# Patient Record
Sex: Male | Born: 1955
Health system: Southern US, Community
[De-identification: ages and names within clinical notes are randomized; demographics above are authoritative.]

## PROBLEM LIST (undated history)

## (undated) DIAGNOSIS — F419 Anxiety disorder, unspecified: Secondary | ICD-10-CM

## (undated) DIAGNOSIS — D4989 Neoplasm of unspecified behavior of other specified sites: Secondary | ICD-10-CM

## (undated) DIAGNOSIS — C801 Malignant (primary) neoplasm, unspecified: Secondary | ICD-10-CM

## (undated) DIAGNOSIS — E039 Hypothyroidism, unspecified: Secondary | ICD-10-CM

## (undated) DIAGNOSIS — Z923 Personal history of irradiation: Secondary | ICD-10-CM

## (undated) HISTORY — DX: Anxiety disorder, unspecified: F41.9

## (undated) HISTORY — PX: SKIN LESION EXCISION: SHX2412

---

## 2018-08-26 NOTE — Progress Notes (Deleted)
   New Patient Office Visit  Assessment & Plan:  ***  Follow-up: No follow-ups on file.   Hendricks Limes, MSN, APRN, FNP-C [facility]  Subjective:  Patient ID: Antonio Scott, male    DOB: 12-Sep-1955  Age: 63 y.o. MRN: [id number]  No care team member to display  CC: No chief complaint on file.   HPI Amiere Cawley presents to establish care. *** Patient also ***   ROS  No current outpatient medications on file.  Not on File  No past medical history on file.  *** The histories are not reviewed yet. Please review them in the "History" navigator section and refresh this [place].  No family history on file.  Social History   Socioeconomic History  . Marital status: Not on file    Spouse name: Not on file  . Number of children: Not on file  . Years of education: Not on file  . Highest education level: Not on file  Occupational History  . Not on file  Social Needs  . Financial resource strain: Not on file  . Food insecurity    Worry: Not on file    Inability: Not on file  . Transportation needs    Medical: Not on file    Non-medical: Not on file  Tobacco Use  . Smoking status: Not on file  Substance and Sexual Activity  . Alcohol use: Not on file  . Drug use: Not on file  . Sexual activity: Not on file  Lifestyle  . Physical activity    Days per week: Not on file    Minutes per session: Not on file  . Stress: Not on file  Relationships  . Social Herbalist on phone: Not on file    Gets together: Not on file    Attends religious service: Not on file    Active member of club or organization: Not on file    Attends meetings of clubs or organizations: Not on file    Relationship status: Not on file  . Intimate partner violence    Fear of current or ex partner: Not on file    Emotionally abused: Not on file    Physically abused: Not on file    Forced sexual activity: Not on file  Other Topics Concern  . Not on file   Social History Narrative  . Not on file    Objective:   Today's Vitals: There were no vitals taken for this visit.  Physical Exam

## 2018-08-27 ENCOUNTER — Ambulatory Visit: Payer: Self-pay | Admitting: Family Medicine

## 2020-09-03 DIAGNOSIS — G8929 Other chronic pain: Secondary | ICD-10-CM | POA: Diagnosis not present

## 2020-09-03 DIAGNOSIS — Z720 Tobacco use: Secondary | ICD-10-CM | POA: Diagnosis not present

## 2020-09-03 DIAGNOSIS — Z833 Family history of diabetes mellitus: Secondary | ICD-10-CM | POA: Diagnosis not present

## 2020-09-03 DIAGNOSIS — Z809 Family history of malignant neoplasm, unspecified: Secondary | ICD-10-CM | POA: Diagnosis not present

## 2020-09-03 DIAGNOSIS — K08109 Complete loss of teeth, unspecified cause, unspecified class: Secondary | ICD-10-CM | POA: Diagnosis not present

## 2020-09-03 DIAGNOSIS — R030 Elevated blood-pressure reading, without diagnosis of hypertension: Secondary | ICD-10-CM | POA: Diagnosis not present

## 2020-11-15 ENCOUNTER — Encounter: Payer: Self-pay | Admitting: Family Medicine

## 2020-11-15 ENCOUNTER — Ambulatory Visit (INDEPENDENT_AMBULATORY_CARE_PROVIDER_SITE_OTHER): Payer: Medicare HMO | Admitting: Family Medicine

## 2020-11-15 ENCOUNTER — Other Ambulatory Visit: Payer: Self-pay

## 2020-11-15 VITALS — BP 116/79 | HR 63 | Temp 98.0°F | Ht 71.25 in | Wt 176.0 lb

## 2020-11-15 DIAGNOSIS — F17210 Nicotine dependence, cigarettes, uncomplicated: Secondary | ICD-10-CM

## 2020-11-15 DIAGNOSIS — Z122 Encounter for screening for malignant neoplasm of respiratory organs: Secondary | ICD-10-CM

## 2020-11-15 DIAGNOSIS — Z1329 Encounter for screening for other suspected endocrine disorder: Secondary | ICD-10-CM

## 2020-11-15 DIAGNOSIS — Z1159 Encounter for screening for other viral diseases: Secondary | ICD-10-CM

## 2020-11-15 DIAGNOSIS — Z13220 Encounter for screening for lipoid disorders: Secondary | ICD-10-CM

## 2020-11-15 DIAGNOSIS — R351 Nocturia: Secondary | ICD-10-CM

## 2020-11-15 DIAGNOSIS — Z131 Encounter for screening for diabetes mellitus: Secondary | ICD-10-CM

## 2020-11-15 DIAGNOSIS — Z1211 Encounter for screening for malignant neoplasm of colon: Secondary | ICD-10-CM | POA: Diagnosis not present

## 2020-11-15 DIAGNOSIS — Z23 Encounter for immunization: Secondary | ICD-10-CM

## 2020-11-15 DIAGNOSIS — Z130 Encounter for screening for diseases of the blood and blood-forming organs and certain disorders involving the immune mechanism: Secondary | ICD-10-CM

## 2020-11-15 DIAGNOSIS — Z125 Encounter for screening for malignant neoplasm of prostate: Secondary | ICD-10-CM

## 2020-11-15 DIAGNOSIS — Z136 Encounter for screening for cardiovascular disorders: Secondary | ICD-10-CM | POA: Diagnosis not present

## 2020-11-15 DIAGNOSIS — R69 Illness, unspecified: Secondary | ICD-10-CM | POA: Diagnosis not present

## 2020-11-15 DIAGNOSIS — Z1212 Encounter for screening for malignant neoplasm of rectum: Secondary | ICD-10-CM

## 2020-11-15 LAB — URINALYSIS, ROUTINE W REFLEX MICROSCOPIC
Bilirubin, UA: NEGATIVE
Glucose, UA: NEGATIVE
Ketones, UA: NEGATIVE
Leukocytes,UA: NEGATIVE
Nitrite, UA: NEGATIVE
Protein,UA: NEGATIVE
RBC, UA: NEGATIVE
Specific Gravity, UA: 1.015 (ref 1.005–1.030)
Urobilinogen, Ur: 0.2 mg/dL (ref 0.2–1.0)
pH, UA: 6 (ref 5.0–7.5)

## 2020-11-15 NOTE — Progress Notes (Signed)
Subjective:  Patient ID: Antonio Scott, male    DOB: [birth year], 65 y.o.   MRN: [id number]  Patient Care Team: Baruch Gouty, FNP as PCP - General (Family Medicine)   Chief Complaint:  New Patient (Initial Visit)   HPI: Antonio Scott is a 65 y.o. male presenting on 11/15/2020 for New Patient (Initial Visit)  Pt presents today to establish care with PCP, he has not seen a provider in over 15 years. He was seen at Urgent Care in 2020 for puncture wound of foot and cellulitis. No blood work in over 15 years. He reports overall he feels well. Is concerned about colorectal cancer as his grandfather had this. He states his bowel movements are normal. No melena or hematochezia. No weight loss, night sweats, fatigue, or malaise. He states he has nocturia, gets up at least 2-3 times per night to void. He admits to excessive caffeine use and states he drinks several cups of coffee per day and does have coffee before bed. He denies dysuria, hematuria, urgency, dribbling, weak stream, or difficulty initiating voiding. No rectal or scrotal pain/pressure, no scrotal swelling. He smokes at least 1 PPD and has for 50 years. No prior lung cancer screenings. He does not follow a diet or exercise routine. No recent illnesses or injuries.   Relevant past medical, surgical, family, and social history reviewed and updated as indicated.  Allergies and medications reviewed and updated. Data reviewed: Chart in Epic.   Past Medical History:  Diagnosis Date   Anxiety     Past Surgical History:  Procedure Laterality Date   SKIN LESION EXCISION      Social History   Socioeconomic History   Marital status: Single    Spouse name: Not on file   Number of children: Not on file   Years of education: Not on file   Highest education level: Not on file  Occupational History   Not on file  Tobacco Use   Smoking status: Every Day    Packs/day: 1.00    Years: 50.00    Pack years: 50.00    Types: Cigarettes    Smokeless tobacco: Never  Vaping Use   Vaping Use: Never used  Substance and Sexual Activity   Alcohol use: Yes    Alcohol/week: 2.0 standard drinks    Types: 2 Cans of beer per week   Drug use: Not Currently   Sexual activity: Not Currently  Other Topics Concern   Not on file  Social History Narrative   Not on file   Social Determinants of Health   Financial Resource Strain: Not on file  Food Insecurity: Not on file  Transportation Needs: Not on file  Physical Activity: Not on file  Stress: Not on file  Social Connections: Not on file  Intimate Partner Violence: Not on file    No Known Allergies  Review of Systems  Constitutional:  Negative for activity change, appetite change, chills, diaphoresis, fatigue, fever and unexpected weight change.  HENT: Negative.    Eyes: Negative.  Negative for photophobia and visual disturbance.  Respiratory:  Negative for cough, chest tightness and shortness of breath.   Cardiovascular:  Negative for chest pain, palpitations and leg swelling.  Gastrointestinal:  Negative for abdominal pain, blood in stool, constipation, diarrhea, nausea and vomiting.  Endocrine: Negative.   Genitourinary:  Positive for frequency. Negative for decreased urine volume, difficulty urinating, dysuria and urgency.  Musculoskeletal:  Negative for arthralgias and myalgias.  Skin: Negative.  Allergic/Immunologic: Negative.   Neurological:  Negative for dizziness, tremors, syncope, facial asymmetry, speech difficulty, weakness, light-headedness, numbness and headaches.  Hematological: Negative.   Psychiatric/Behavioral:  Negative for confusion, hallucinations, sleep disturbance and suicidal ideas.   All other systems reviewed and are negative.      Objective:  BP 116/79   Pulse 63   Temp 98 F (36.7 C)   Ht 5' 11.25" (1.81 m)   Wt 176 lb (79.8 kg)   SpO2 100%   BMI 24.38 kg/m    Wt Readings from Last 3 Encounters:  11/15/20 176 lb (79.8 kg)     Physical Exam Vitals and nursing note reviewed.  Constitutional:      General: He is not in acute distress.    Appearance: Normal appearance. He is well-developed, well-groomed and normal weight. He is not ill-appearing, toxic-appearing or diaphoretic.  HENT:     Head: Normocephalic and atraumatic.     Jaw: There is normal jaw occlusion.     Right Ear: Hearing, tympanic membrane, ear canal and external ear normal.     Left Ear: Hearing, tympanic membrane, ear canal and external ear normal.     Nose: Nose normal.     Mouth/Throat:     Lips: Pink.     Mouth: Mucous membranes are moist.     Dentition: Abnormal dentition (missing teeth).     Pharynx: Oropharynx is clear. Uvula midline.  Eyes:     General: Lids are normal.     Extraocular Movements: Extraocular movements intact.     Conjunctiva/sclera: Conjunctivae normal.     Pupils: Pupils are equal, round, and reactive to light.  Neck:     Thyroid: No thyroid mass, thyromegaly or thyroid tenderness.     Vascular: No carotid bruit or JVD.     Trachea: Trachea and phonation normal.  Cardiovascular:     Rate and Rhythm: Normal rate and regular rhythm.     Chest Wall: PMI is not displaced.     Pulses: Normal pulses.     Heart sounds: Normal heart sounds. No murmur heard.   No friction rub. No gallop.  Pulmonary:     Effort: Pulmonary effort is normal. No respiratory distress.     Breath sounds: Normal breath sounds. No wheezing.  Abdominal:     General: Bowel sounds are normal. There is no distension or abdominal bruit.     Palpations: Abdomen is soft. There is no hepatomegaly or splenomegaly.     Tenderness: There is no abdominal tenderness. There is no right CVA tenderness or left CVA tenderness.     Hernia: No hernia is present.  Musculoskeletal:        General: Normal range of motion.     Cervical back: Normal range of motion and neck supple.     Right lower leg: No edema.     Left lower leg: No edema.   Lymphadenopathy:     Cervical: No cervical adenopathy.  Skin:    General: Skin is warm and dry.     Capillary Refill: Capillary refill takes less than 2 seconds.     Coloration: Skin is not cyanotic, jaundiced or pale.     Findings: No rash.  Neurological:     General: No focal deficit present.     Mental Status: He is alert and oriented to person, place, and time.     Cranial Nerves: Cranial nerves are intact. No cranial nerve deficit.     Sensory: Sensation is intact.  No sensory deficit.     Motor: Motor function is intact. No weakness.     Coordination: Coordination is intact. Coordination normal.     Gait: Gait is intact. Gait normal.     Deep Tendon Reflexes: Reflexes are normal and symmetric. Reflexes normal.  Psychiatric:        Attention and Perception: Attention and perception normal.        Mood and Affect: Mood normal. Affect is flat.        Speech: Speech normal.        Behavior: Behavior normal. Behavior is cooperative.        Thought Content: Thought content normal.        Cognition and Memory: Cognition and memory normal.        Judgment: Judgment normal.    Urinalysis unremarkable in office.   Pertinent labs & imaging results that were available during my care of the patient were reviewed by me and considered in my medical decision making.  Assessment & Plan:  Antonio Scott was seen today for new patient (initial visit).  Diagnoses and all orders for this visit:  Nocturia more than twice per night Urinalysis unremarkable in office. Admits to excessive caffeine intake throughout the day, could be contributatory. Pt aware to limit intake of caffeine at least 4 hours prior to bedtime. Will obtain PSA. Likely BPH. Pt aware to report new, worsening, or persistent symptoms.  -     Urinalysis, Routine w reflex microscopic -     PR PSA, TOTAL SCREENING  Screening for colorectal cancer Referral to GI placed.  -     Ambulatory referral to Gastroenterology  Screening for  lung cancer Heavy cigarette smoker 50 pack year history with continued addiction, cessation emphasized. Will obtain LDCT chest for lung cancer screening.  -     CT CHEST LUNG CA SCREEN LOW DOSE W/O CM; Future  Screening for thyroid disorder Will check thyroid function today.  -     Thyroid Panel With TSH  Screening for lipid disorders Will check cholesterol today, pt is fasting.  -     Lipid panel  Screening for deficiency anemia Will check CBC today, if abnormal, will add additional labs.        -     CBC with Differential/Platelet  Screening for diabetes mellitus Will check CMP today.  -     CMP  Screening for prostate cancer Will check PSA today.  -     PR PSA, TOTAL SCREENING  Encounter for hepatitis C screening test for low risk patient -     Hepatitis C antibody  Need for influenza vaccination Influenza vaccine provided today.     Continue all other maintenance medications.  Follow up plan: Return in about 6 months (around 05/16/2021), or if symptoms worsen or fail to improve.   Continue healthy lifestyle choices, including diet (rich in fruits, vegetables, and lean proteins, and low in salt and simple carbohydrates) and exercise (at least 30 minutes of moderate physical activity daily).  Educational handout given for health maintenance  The above assessment and management plan was discussed with the patient. The patient verbalized understanding of and has agreed to the management plan. Patient is aware to call the clinic if they develop any new symptoms or if symptoms persist or worsen. Patient is aware when to return to the clinic for a follow-up visit. Patient educated on when it is appropriate to go to the emergency department.   Monia Pouch, FNP-C  [place] [phone]

## 2020-11-16 ENCOUNTER — Ambulatory Visit (INDEPENDENT_AMBULATORY_CARE_PROVIDER_SITE_OTHER): Payer: Medicare HMO | Admitting: Family Medicine

## 2020-11-16 ENCOUNTER — Other Ambulatory Visit: Payer: Self-pay

## 2020-11-16 ENCOUNTER — Encounter: Payer: Self-pay | Admitting: Family Medicine

## 2020-11-16 VITALS — BP 121/77 | HR 71 | Temp 97.7°F | Ht 71.25 in | Wt 176.0 lb

## 2020-11-16 DIAGNOSIS — R7989 Other specified abnormal findings of blood chemistry: Secondary | ICD-10-CM | POA: Insufficient documentation

## 2020-11-16 DIAGNOSIS — E039 Hypothyroidism, unspecified: Secondary | ICD-10-CM | POA: Diagnosis not present

## 2020-11-16 DIAGNOSIS — E782 Mixed hyperlipidemia: Secondary | ICD-10-CM

## 2020-11-16 LAB — CBC WITH DIFFERENTIAL/PLATELET
Basophils Absolute: 0.1 10*3/uL (ref 0.0–0.2)
Basos: 2 %
EOS (ABSOLUTE): 0.3 10*3/uL (ref 0.0–0.4)
Eos: 4 %
Hematocrit: 41.7 % (ref 37.5–51.0)
Hemoglobin: 13.9 g/dL (ref 13.0–17.7)
Immature Grans (Abs): 0 10*3/uL (ref 0.0–0.1)
Immature Granulocytes: 0 %
Lymphocytes Absolute: 1.3 10*3/uL (ref 0.7–3.1)
Lymphs: 21 %
MCH: 27.6 pg (ref 26.6–33.0)
MCHC: 33.3 g/dL (ref 31.5–35.7)
MCV: 83 fL (ref 79–97)
Monocytes Absolute: 0.7 10*3/uL (ref 0.1–0.9)
Monocytes: 11 %
Neutrophils Absolute: 4 10*3/uL (ref 1.4–7.0)
Neutrophils: 62 %
Platelets: 244 10*3/uL (ref 150–450)
RBC: 5.04 x10E6/uL (ref 4.14–5.80)
RDW: 12.7 % (ref 11.6–15.4)
WBC: 6.4 10*3/uL (ref 3.4–10.8)

## 2020-11-16 LAB — CMP14+EGFR
ALT: 18 IU/L (ref 0–44)
AST: 21 IU/L (ref 0–40)
Albumin/Globulin Ratio: 1.7 (ref 1.2–2.2)
Albumin: 4.7 g/dL (ref 3.8–4.8)
Alkaline Phosphatase: 95 IU/L (ref 44–121)
BUN/Creatinine Ratio: 8 — ABNORMAL LOW (ref 10–24)
BUN: 13 mg/dL (ref 8–27)
Bilirubin Total: 0.3 mg/dL (ref 0.0–1.2)
CO2: 22 mmol/L (ref 20–29)
Calcium: 10 mg/dL (ref 8.6–10.2)
Chloride: 101 mmol/L (ref 96–106)
Creatinine, Ser: 1.71 mg/dL — ABNORMAL HIGH (ref 0.76–1.27)
Globulin, Total: 2.7 g/dL (ref 1.5–4.5)
Glucose: 81 mg/dL (ref 70–99)
Potassium: 4.9 mmol/L (ref 3.5–5.2)
Sodium: 139 mmol/L (ref 134–144)
Total Protein: 7.4 g/dL (ref 6.0–8.5)
eGFR: 44 mL/min/{1.73_m2} — ABNORMAL LOW (ref >59)

## 2020-11-16 LAB — THYROID PANEL WITH TSH
Free Thyroxine Index: 1.9 (ref 1.2–4.9)
T3 Uptake Ratio: 29 % (ref 24–39)
T4, Total: 6.7 ug/dL (ref 4.5–12.0)
TSH: 12 u[IU]/mL — ABNORMAL HIGH (ref 0.450–4.500)

## 2020-11-16 LAB — LIPID PANEL
Chol/HDL Ratio: 3.3 ratio (ref 0.0–5.0)
Cholesterol, Total: 208 mg/dL — ABNORMAL HIGH (ref 100–199)
HDL: 64 mg/dL (ref >39)
LDL Chol Calc (NIH): 103 mg/dL — ABNORMAL HIGH (ref 0–99)
Triglycerides: 246 mg/dL — ABNORMAL HIGH (ref 0–149)
VLDL Cholesterol Cal: 41 mg/dL — ABNORMAL HIGH (ref 5–40)

## 2020-11-16 LAB — HEPATITIS C ANTIBODY: Hep C Virus Ab: <0.1 s/co ratio (ref 0.0–0.9)

## 2020-11-16 MED ORDER — LEVOTHYROXINE SODIUM 25 MCG PO TABS: 25.0000 ug | ORAL_TABLET | Freq: Every day | ORAL | 3 refills | Status: DC

## 2020-11-16 NOTE — Progress Notes (Signed)
Subjective:  Patient ID: Antonio Scott, male    DOB: October 10, 1955, 65 y.o.   MRN: [id number]  Patient Care Team: Baruch Gouty, FNP as PCP - General (Family Medicine)   Chief Complaint:  Medical Management of Chronic Issues (Elevated labs, tx plan/)   HPI: Antonio Scott is a 65 y.o. male presenting on 11/16/2020 for Medical Management of Chronic Issues (Elevated labs, tx plan/)   Pt presents today for follow up and to discuss recent lab results. Pt denies prior history of renal issues, thyroid disease, or known cholesterol problems. Prior to last visit pt had not seen a provider in several years. He does report his mother had thyroid problems. He denies any hyper- or hypothyroid symptoms. No urinary symptoms. No chest pain, leg swelling, headaches, confusion, weakness, or shortness of breath. Denies excessive NSAID use. Does not drink water, only coffee.    Relevant past medical, surgical, family, and social history reviewed and updated as indicated.  Allergies and medications reviewed and updated. Data reviewed: Chart in Epic.   Past Medical History:  Diagnosis Date   Anxiety     Past Surgical History:  Procedure Laterality Date   SKIN LESION EXCISION      Social History   Socioeconomic History   Marital status: Single    Spouse name: Not on file   Number of children: Not on file   Years of education: Not on file   Highest education level: Not on file  Occupational History   Not on file  Tobacco Use   Smoking status: Every Day    Packs/day: 1.00    Years: 50.00    Pack years: 50.00    Types: Cigarettes   Smokeless tobacco: Never  Vaping Use   Vaping Use: Never used  Substance and Sexual Activity   Alcohol use: Yes    Alcohol/week: 2.0 standard drinks    Types: 2 Cans of beer per week   Drug use: Not Currently   Sexual activity: Not Currently  Other Topics Concern   Not on file  Social History Narrative   Not on file   Social Determinants of Health    Financial Resource Strain: Not on file  Food Insecurity: Not on file  Transportation Needs: Not on file  Physical Activity: Not on file  Stress: Not on file  Social Connections: Not on file  Intimate Partner Violence: Not on file    Outpatient Encounter Medications as of 11/16/2020  Medication Sig   levothyroxine (SYNTHROID) 25 MCG tablet Take 1 tablet (25 mcg total) by mouth daily.   No facility-administered encounter medications on file as of 11/16/2020.    No Known Allergies  Review of Systems  Constitutional:  Negative for activity change, appetite change, chills, fatigue and fever.  HENT: Negative.    Eyes: Negative.   Respiratory:  Negative for cough, chest tightness and shortness of breath.   Cardiovascular:  Negative for chest pain, palpitations and leg swelling.  Gastrointestinal:  Negative for blood in stool, constipation, diarrhea, nausea and vomiting.  Endocrine: Negative.   Genitourinary:  Negative for dysuria, frequency and urgency.  Musculoskeletal:  Negative for arthralgias and myalgias.  Skin: Negative.   Allergic/Immunologic: Negative.   Neurological:  Negative for dizziness and headaches.  Hematological: Negative.   Psychiatric/Behavioral:  Negative for confusion, hallucinations, sleep disturbance and suicidal ideas.   All other systems reviewed and are negative.      Objective:  BP 121/77   Pulse 71  Temp 97.7 F (36.5 C)   Ht 5' 11.25" (1.81 m)   Wt 176 lb (79.8 kg)   SpO2 100%   BMI 24.38 kg/m    Wt Readings from Last 3 Encounters:  11/16/20 176 lb (79.8 kg)  11/15/20 176 lb (79.8 kg)    Physical Exam Vitals and nursing note reviewed.  Constitutional:      General: He is not in acute distress.    Appearance: Normal appearance. He is well-developed, well-groomed and normal weight. He is not ill-appearing, toxic-appearing or diaphoretic.  HENT:     Head: Normocephalic and atraumatic.     Jaw: There is normal jaw occlusion.      Right Ear: Hearing normal.     Left Ear: Hearing normal.     Nose: Nose normal.     Mouth/Throat:     Lips: Pink.     Mouth: Mucous membranes are moist.     Pharynx: Oropharynx is clear. Uvula midline.  Eyes:     General: Lids are normal.     Extraocular Movements: Extraocular movements intact.     Conjunctiva/sclera: Conjunctivae normal.     Pupils: Pupils are equal, round, and reactive to light.  Neck:     Thyroid: No thyroid mass, thyromegaly or thyroid tenderness.     Vascular: No carotid bruit or JVD.     Trachea: Trachea and phonation normal.  Cardiovascular:     Rate and Rhythm: Normal rate and regular rhythm.     Chest Wall: PMI is not displaced.     Pulses: Normal pulses.     Heart sounds: Normal heart sounds. No murmur heard.   No friction rub. No gallop.  Pulmonary:     Effort: Pulmonary effort is normal. No respiratory distress.     Breath sounds: Normal breath sounds. No wheezing.  Abdominal:     General: There is no abdominal bruit.     Palpations: There is no hepatomegaly or splenomegaly.  Musculoskeletal:        General: Normal range of motion.     Cervical back: Normal range of motion and neck supple.     Right lower leg: No edema.     Left lower leg: No edema.  Lymphadenopathy:     Cervical: No cervical adenopathy.  Skin:    General: Skin is warm and dry.     Capillary Refill: Capillary refill takes less than 2 seconds.     Coloration: Skin is not cyanotic, jaundiced or pale.     Findings: No rash.  Neurological:     General: No focal deficit present.     Mental Status: He is alert and oriented to person, place, and time.     Cranial Nerves: Cranial nerves are intact. No cranial nerve deficit.     Sensory: Sensation is intact. No sensory deficit.     Motor: Motor function is intact. No weakness.     Coordination: Coordination is intact. Coordination normal.     Gait: Gait is intact. Gait normal.     Deep Tendon Reflexes: Reflexes are normal and  symmetric. Reflexes normal.  Psychiatric:        Attention and Perception: Attention and perception normal.        Mood and Affect: Mood and affect normal.        Speech: Speech normal.        Behavior: Behavior normal. Behavior is cooperative.        Thought Content: Thought content normal.  Cognition and Memory: Cognition and memory normal.        Judgment: Judgment normal.    Results for orders placed or performed in visit on 11/15/20  CBC with Differential/Platelet  Result Value Ref Range   WBC 6.4 3.4 - 10.8 x10E3/uL   RBC 5.04 4.14 - 5.80 x10E6/uL   Hemoglobin 13.9 13.0 - 17.7 g/dL   Hematocrit 41.7 37.5 - 51.0 %   MCV 83 79 - 97 fL   MCH 27.6 26.6 - 33.0 pg   MCHC 33.3 31.5 - 35.7 g/dL   RDW 12.7 11.6 - 15.4 %   Platelets 244 150 - 450 x10E3/uL   Neutrophils 62 Not Estab. %   Lymphs 21 Not Estab. %   Monocytes 11 Not Estab. %   Eos 4 Not Estab. %   Basos 2 Not Estab. %   Neutrophils Absolute 4.0 1.4 - 7.0 x10E3/uL   Lymphocytes Absolute 1.3 0.7 - 3.1 x10E3/uL   Monocytes Absolute 0.7 0.1 - 0.9 x10E3/uL   EOS (ABSOLUTE) 0.3 0.0 - 0.4 x10E3/uL   Basophils Absolute 0.1 0.0 - 0.2 x10E3/uL   Immature Granulocytes 0 Not Estab. %   Immature Grans (Abs) 0.0 0.0 - 0.1 x10E3/uL  CMP14+EGFR  Result Value Ref Range   Glucose 81 70 - 99 mg/dL   BUN 13 8 - 27 mg/dL   Creatinine, Ser 1.71 (H) 0.76 - 1.27 mg/dL   eGFR 44 (L) >59 mL/min/1.73   BUN/Creatinine Ratio 8 (L) 10 - 24   Sodium 139 134 - 144 mmol/L   Potassium 4.9 3.5 - 5.2 mmol/L   Chloride 101 96 - 106 mmol/L   CO2 22 20 - 29 mmol/L   Calcium 10.0 8.6 - 10.2 mg/dL   Total Protein 7.4 6.0 - 8.5 g/dL   Albumin 4.7 3.8 - 4.8 g/dL   Globulin, Total 2.7 1.5 - 4.5 g/dL   Albumin/Globulin Ratio 1.7 1.2 - 2.2   Bilirubin Total 0.3 0.0 - 1.2 mg/dL   Alkaline Phosphatase 95 44 - 121 IU/L   AST 21 0 - 40 IU/L   ALT 18 0 - 44 IU/L  Lipid panel  Result Value Ref Range   Cholesterol, Total 208 (H) 100 - 199 mg/dL    Triglycerides 246 (H) 0 - 149 mg/dL   HDL 64 >39 mg/dL   VLDL Cholesterol Cal 41 (H) 5 - 40 mg/dL   LDL Chol Calc (NIH) 103 (H) 0 - 99 mg/dL   Chol/HDL Ratio 3.3 0.0 - 5.0 ratio  Hepatitis C antibody  Result Value Ref Range   Hep C Virus Ab <0.1 0.0 - 0.9 s/co ratio  Thyroid Panel With TSH  Result Value Ref Range   TSH 12.000 (H) 0.450 - 4.500 uIU/mL   T4, Total 6.7 4.5 - 12.0 ug/dL   T3 Uptake Ratio 29 24 - 39 %   Free Thyroxine Index 1.9 1.2 - 4.9  Urinalysis, Routine w reflex microscopic  Result Value Ref Range   Specific Gravity, UA 1.015 1.005 - 1.030   pH, UA 6.0 5.0 - 7.5   Color, UA Yellow Yellow   Appearance Ur Clear Clear   Leukocytes,UA Negative Negative   Protein,UA Negative Negative/Trace   Glucose, UA Negative Negative   Ketones, UA Negative Negative   RBC, UA Negative Negative   Bilirubin, UA Negative Negative   Urobilinogen, Ur 0.2 0.2 - 1.0 mg/dL   Nitrite, UA Negative Negative       Pertinent labs & imaging results  that were available during my care of the patient were reviewed by me and considered in my medical decision making.  Assessment & Plan:  Antonio Scott was seen today for medical management of chronic issues.  Diagnoses and all orders for this visit:  Acquired hypothyroidism Will initiate repletion therapy today, recheck thyroid function in 3 months. Discussed proper dosing of synthroid, empty stomach with water only.  -     levothyroxine (SYNTHROID) 25 MCG tablet; Take 1 tablet (25 mcg total) by mouth daily.  Mixed hyperlipidemia Does not wish to start statin therapy today. Diet and exercise discussed in detail. Will recheck in 3-6 months and discuss medications if still elevated.   Elevated serum creatinine Repeat BMP in 1 week. Increase water intake and avoid NSAIDs.  -     BMP8+EGFR -     Microalbumin / creatinine urine ratio    Continue all other maintenance medications.  Follow up plan: Return in about 3 months (around 02/16/2021), or if  symptoms worsen or fail to improve, for thyroid, lipids, renal function. BMP 2 weeks.   Continue healthy lifestyle choices, including diet (rich in fruits, vegetables, and lean proteins, and low in salt and simple carbohydrates) and exercise (at least 30 minutes of moderate physical activity daily).  Educational handout given for hypothyroidism, high cholesterol   The above assessment and management plan was discussed with the patient. The patient verbalized understanding of and has agreed to the management plan. Patient is aware to call the clinic if they develop any new symptoms or if symptoms persist or worsen. Patient is aware when to return to the clinic for a follow-up visit. Patient educated on when it is appropriate to go to the emergency department.   Monia Pouch, FNP-C [facility] [phone]

## 2020-11-20 ENCOUNTER — Encounter: Payer: Self-pay | Admitting: Internal Medicine

## 2020-11-30 ENCOUNTER — Other Ambulatory Visit: Payer: Medicare HMO

## 2020-11-30 ENCOUNTER — Other Ambulatory Visit: Payer: Self-pay

## 2020-11-30 DIAGNOSIS — R7989 Other specified abnormal findings of blood chemistry: Secondary | ICD-10-CM | POA: Diagnosis not present

## 2020-12-01 LAB — BMP8+EGFR
BUN/Creatinine Ratio: 9 — ABNORMAL LOW (ref 10–24)
BUN: 15 mg/dL (ref 8–27)
CO2: 24 mmol/L (ref 20–29)
Calcium: 9.7 mg/dL (ref 8.6–10.2)
Chloride: 99 mmol/L (ref 96–106)
Creatinine, Ser: 1.66 mg/dL — ABNORMAL HIGH (ref 0.76–1.27)
Glucose: 87 mg/dL (ref 70–99)
Potassium: 4.8 mmol/L (ref 3.5–5.2)
Sodium: 135 mmol/L (ref 134–144)
eGFR: 45 mL/min/{1.73_m2} — ABNORMAL LOW (ref >59)

## 2020-12-03 LAB — MICROALBUMIN / CREATININE URINE RATIO
Creatinine, Urine: 23.7 mg/dL
Microalb/Creat Ratio: <13 mg/g creat (ref 0–29)
Microalbumin, Urine: <3 ug/mL

## 2020-12-17 ENCOUNTER — Ambulatory Visit: Payer: Medicare HMO

## 2020-12-17 ENCOUNTER — Encounter: Payer: Self-pay | Admitting: Internal Medicine

## 2020-12-26 ENCOUNTER — Telehealth: Payer: Self-pay | Admitting: Family Medicine

## 2021-01-03 ENCOUNTER — Other Ambulatory Visit: Payer: Self-pay | Admitting: *Deleted

## 2021-01-03 DIAGNOSIS — F17210 Nicotine dependence, cigarettes, uncomplicated: Secondary | ICD-10-CM

## 2021-01-03 DIAGNOSIS — Z87891 Personal history of nicotine dependence: Secondary | ICD-10-CM

## 2021-01-22 ENCOUNTER — Telehealth: Payer: Self-pay | Admitting: Family Medicine

## 2021-01-22 NOTE — Telephone Encounter (Signed)
°  Left message for patient to call back and schedule Medicare Annual Wellness Visit (AWV) to be completed by video or phone. ° °No hx of AWV  ° °Please schedule at anytime with [facility] Nurse Health Advisor --- Mary Jane ° °45 Minutes appointment  ° °Any questions, please call me at [phone]   °

## 2021-02-08 ENCOUNTER — Encounter: Payer: Self-pay | Admitting: Acute Care

## 2021-02-08 ENCOUNTER — Ambulatory Visit (INDEPENDENT_AMBULATORY_CARE_PROVIDER_SITE_OTHER): Payer: Medicare HMO | Admitting: Acute Care

## 2021-02-08 ENCOUNTER — Other Ambulatory Visit: Payer: Self-pay

## 2021-02-08 DIAGNOSIS — F17210 Nicotine dependence, cigarettes, uncomplicated: Secondary | ICD-10-CM

## 2021-02-08 DIAGNOSIS — R69 Illness, unspecified: Secondary | ICD-10-CM | POA: Diagnosis not present

## 2021-02-08 NOTE — Patient Instructions (Signed)
Thank you for participating in the [employer] Lung Cancer Screening Program. °It was our pleasure to meet you today. °We will call you with the results of your scan within the next few days. °Your scan will be assigned a Lung RADS category score by the physicians reading the scans.  °This Lung RADS score determines follow up scanning.  °See below for description of categories, and follow up screening recommendations. °We will be in touch to schedule your follow up screening annually or based on recommendations of our providers. °We will fax a copy of your scan results to your Primary Care Physician, or the physician who referred you to the program, to ensure they have the results. °Please call the office if you have any questions or concerns regarding your scanning experience or results.  °Our office number is [phone]. °Please speak with Denise Phelps, RN. She is our Lung Cancer Screening RN. °If she is unavailable when you call, please have the office staff send her a message. She will return your call at her earliest convenience. °Remember, if your scan is normal, we will scan you annually as long as you continue to meet the criteria for the program. (Age 55-77, Current smoker or smoker who has quit within the last 15 years). °If you are a smoker, remember, quitting is the single most powerful action that you can take to decrease your risk of lung cancer and other pulmonary, breathing related problems. °We know quitting is hard, and we are here to help.  °Please let us know if there is anything we can do to help you meet your goal of quitting. °If you are a former smoker, congratulations. We are proud of you! Remain smoke free! °Remember you can refer friends or family members through the number above.  °We will screen them to make sure they meet criteria for the program. °Thank you for helping us take better care of you by participating in Lung Screening. ° °You can receive free nicotine replacement therapy  ( patches, gum or mints) by calling 1-800-QUIT NOW. Please call so we can get you on the path to becoming  a non-smoker. I know it is hard, but you can do this! ° °Lung RADS Categories: ° °Lung RADS 1: no nodules or definitely non-concerning nodules.  °Recommendation is for a repeat annual scan in 12 months. ° °Lung RADS 2:  nodules that are non-concerning in appearance and behavior with a very low likelihood of becoming an active cancer. °Recommendation is for a repeat annual scan in 12 months. ° °Lung RADS 3: nodules that are probably non-concerning , includes nodules with a low likelihood of becoming an active cancer.  Recommendation is for a 6-month repeat screening scan. Often noted after an upper respiratory illness. We will be in touch to make sure you have no questions, and to schedule your 6-month scan. ° °Lung RADS 4 A: nodules with concerning findings, recommendation is most often for a follow up scan in 3 months or additional testing based on our provider's assessment of the scan. We will be in touch to make sure you have no questions and to schedule the recommended 3 month follow up scan. ° °Lung RADS 4 B:  indicates findings that are concerning. We will be in touch with you to schedule additional diagnostic testing based on our provider's  assessment of the scan. ° °Hypnosis for smoking cessation  °[employer] °[phone] ° °Acupuncture for smoking cessation  °[facility] °[phone]  °

## 2021-02-08 NOTE — Progress Notes (Signed)
Virtual Visit via Telephone Note  I connected with Antonio Scott on 12/18/20 at  2:00 PM EST by telephone and verified that I am speaking with the correct person using two identifiers.  Location: Patient: Home Provider: Working from home   I discussed the limitations, risks, security and privacy concerns of performing an evaluation and management service by telephone and the availability of in person appointments. I also discussed with the patient that there may be a patient responsible charge related to this service. The patient expressed understanding and agreed to proceed.  Shared Decision Making Visit Lung Cancer Screening Program [phone])   Eligibility: Age 66 y.o. Pack Years Smoking History Calculation 50 (# packs/per year x # years smoked) Recent History of coughing up blood  no Unexplained weight loss? no ( >Than 15 pounds within the last 6 months ) Prior History Lung / other cancer no (Diagnosis within the last 5 years already requiring surveillance chest CT Scans). Smoking Status Current Smoker Former Smokers: Years since quit: NA  Quit Date: NA  Visit Components: Discussion included one or more decision making aids. yes Discussion included risk/benefits of screening. yes Discussion included potential follow up diagnostic testing for abnormal scans. yes Discussion included meaning and risk of over diagnosis. yes Discussion included meaning and risk of False Positives. yes Discussion included meaning of total radiation exposure. yes  Counseling Included: Importance of adherence to annual lung cancer LDCT screening. yes Impact of comorbidities on ability to participate in the program. yes Ability and willingness to under diagnostic treatment. yes  Smoking Cessation Counseling: Current Smokers:  Discussed importance of smoking cessation. yes Information about tobacco cessation classes and interventions provided to patient. yes Patient provided with "ticket" for LDCT  Scan. yes Symptomatic Patient. yes  Counseling(Intermediate counseling: > three minutes) [id number] Diagnosis Code: Tobacco Use Z72.0 Asymptomatic Patient yes  Counseling NA Former Smokers:  Discussed the importance of maintaining cigarette abstinence. yes Diagnosis Code: Personal History of Nicotine Dependence. C12.751 Information about tobacco cessation classes and interventions provided to patient. Yes Patient provided with "ticket" for LDCT Scan. yes Written Order for Lung Cancer Screening with LDCT placed in Epic. Yes (CT Chest Lung Cancer Screening Low Dose W/O CM) [id number] Z12.2-Screening of respiratory organs Z87.891-Personal history of nicotine dependence   I spent 25 minutes of face to face time with him discussing the risks and benefits of lung cancer screening. We viewed a power point together that explained in detail the above noted topics. We took the time to pause the power point at intervals to allow for questions to be asked and answered to ensure understanding. We discussed that he had taken the single most powerful action possible to decrease his risk of developing lung cancer when he quit smoking. I counseled him to remain smoke free, and to contact me if he ever had the desire to smoke again so that I can provide resources and tools to help support the effort to remain smoke free. We discussed the time and location of the scan, and that either  Doroteo Glassman RN or I will call with the results within  24-48 hours of receiving them. He has my card and contact information in the event He needs to speak with me, in addition to a copy of the power point we reviewed as a resource. He verbalized understanding of all of the above and had no further questions upon leaving the office.     I explained to the patient that there has been a high  incidence of coronary artery disease noted on these exams. I explained that this is a non-gated exam therefore degree or severity cannot be determined.  This patient is not on statin therapy. I have asked the patient to follow-up with their PCP regarding any incidental finding of coronary artery disease and management with diet or medication as they feel is clinically indicated. The patient verbalized understanding of the above and had no further questions.  Ruben Mahler D. Kenton Kingfisher, NP-C [facility] Personal contact information can be found on Amion  02/08/2021, 3:09 PM

## 2021-02-11 ENCOUNTER — Ambulatory Visit (HOSPITAL_COMMUNITY)
Admission: RE | Admit: 2021-02-11 | Discharge: 2021-02-11 | Disposition: A | Discharge status: Home / Self Care | Payer: Medicare HMO | Source: Ambulatory Visit | Attending: Family Medicine | Admitting: Family Medicine

## 2021-02-11 ENCOUNTER — Other Ambulatory Visit: Payer: Self-pay

## 2021-02-11 DIAGNOSIS — F17210 Nicotine dependence, cigarettes, uncomplicated: Secondary | ICD-10-CM | POA: Insufficient documentation

## 2021-02-11 DIAGNOSIS — Z87891 Personal history of nicotine dependence: Secondary | ICD-10-CM | POA: Insufficient documentation

## 2021-02-11 DIAGNOSIS — R69 Illness, unspecified: Secondary | ICD-10-CM | POA: Diagnosis not present

## 2021-02-12 ENCOUNTER — Telehealth: Payer: Self-pay | Admitting: Acute Care

## 2021-02-12 NOTE — Telephone Encounter (Signed)
Call made to patient, confirmed DOB. Made aware of MD recommendations. Patient requested an appt in [place]. No openings found. Appt made for 1/16 at 10:00am with SG.   Nothing further needed at this time.   Will route to SG as FYI.

## 2021-02-12 NOTE — Telephone Encounter (Signed)
Dianne from [facility] Radiology called following report:   IMPRESSION: 1. Linear nodular opacities are seen in the left lower lobe, likely scarring related to prior infection. Lung-RADS 3S, probably benign findings. Short-term follow-up in 6 months is recommended with repeat low-dose chest CT without contrast (please use the following order, CT CHEST LCS NODULE FOLLOW-UP W/O CM). S modifier for anterior mediastinal mass and coronary artery calcifications. 2. 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 Recommend further evaluation with contrast-enhanced mediastinal mass protocol MRI. 3. Mild coronary artery calcifications of the LAD, recommend ASCVD risk assessment. 4. Aortic Atherosclerosis (ICD10-I70.0) and Emphysema (ICD10-J43.9).   These results will be called to the ordering clinician or representative by the Radiologist Assistant, and communication documented in the PACS or [employer].     Electronically Signed   By: Yetta Glassman M.D.   On: 02/12/2021 11:38    Dr. Ander Slade please advise as Judson Roch is unavailable today. Thank you

## 2021-02-12 NOTE — Telephone Encounter (Signed)
Best for patient to be scheduled for a visit with a provider  -CT shows a growth around the thymus, it is an organ that usually shrinks as we get older  -will need further evaluation  Could see Antonio Scott  -Will require an MRI of the chest at some point but I think this is best done after seeing somebody in the office

## 2021-02-18 ENCOUNTER — Encounter: Payer: Medicare HMO | Admitting: Acute Care

## 2021-02-18 ENCOUNTER — Telehealth: Payer: Self-pay | Admitting: Family Medicine

## 2021-02-18 ENCOUNTER — Other Ambulatory Visit: Payer: Self-pay

## 2021-02-18 NOTE — Telephone Encounter (Signed)
Opened in error

## 2021-02-18 NOTE — Progress Notes (Signed)
 SABRA

## 2021-02-19 ENCOUNTER — Ambulatory Visit (INDEPENDENT_AMBULATORY_CARE_PROVIDER_SITE_OTHER): Payer: Medicare HMO | Admitting: Family Medicine

## 2021-02-19 ENCOUNTER — Encounter: Payer: Self-pay | Admitting: Family Medicine

## 2021-02-19 VITALS — BP 122/81 | HR 57 | Temp 98.4°F | Ht 71.25 in | Wt 176.0 lb

## 2021-02-19 DIAGNOSIS — E039 Hypothyroidism, unspecified: Secondary | ICD-10-CM | POA: Diagnosis not present

## 2021-02-19 DIAGNOSIS — J9859 Other diseases of mediastinum, not elsewhere classified: Secondary | ICD-10-CM | POA: Diagnosis not present

## 2021-02-19 DIAGNOSIS — E782 Mixed hyperlipidemia: Secondary | ICD-10-CM

## 2021-02-19 DIAGNOSIS — R7989 Other specified abnormal findings of blood chemistry: Secondary | ICD-10-CM | POA: Diagnosis not present

## 2021-02-19 MED ORDER — LEVOTHYROXINE SODIUM 25 MCG PO TABS: 25.0000 ug | ORAL_TABLET | Freq: Every day | ORAL | 3 refills | Status: DC

## 2021-02-19 NOTE — Progress Notes (Signed)
History of Present Illness Antonio Scott is a 66 y.o. male current every day smoker with a 50 pack year smoking history Followed through the screening program.  He is not followed by Pulmonary.    02/20/2021 Pt. Presents for follow up after low dose Ctr Chest. His scan was read as a Lung  RADS 3, nodules that are probably benign findings, short term follow up suggested: includes nodules with a low likelihood of becoming a clinically active cancer. Radiology recommends a 6 month repeat LDCT follow up. 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 Plan will be to get a PET scan and refer top thoracic surgery. Additionally we will start smoking cessation . Pt. States he is not aware of weight loss. He had a brother who died of Thymus cancer on Thanksgiving day 2019. He denies any chest pain. NO weight loss, no fevers. No increased frequency of illnesses. Pt. Is in agreement with the above plan and verbalizes understanding.   Test Results: Low Dose CT Chest 02/12/2021 IMPRESSION: 1. Linear nodular opacities are seen in the left lower lobe, likely scarring related to prior infection. Lung-RADS 3S, probably benign findings. Short-term follow-up in 6 months is recommended with repeat low-dose chest CT without contrast (please use the following order, CT CHEST LCS NODULE FOLLOW-UP W/O CM). S modifier for anterior mediastinal mass and coronary artery calcifications. 2. 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 Recommend further evaluation with contrast-enhanced mediastinal mass protocol MRI. 3. Mild coronary artery calcifications of the LAD, recommend ASCVD risk assessment. 4. Aortic Atherosclerosis (ICD10-I70.0) and  Emphysema (ICD10-J43.9).    CBC Latest Ref Rng & Units 11/15/2020  WBC 3.4 - 10.8 x10E3/uL 6.4  Hemoglobin 13.0 - 17.7 g/dL 13.9  Hematocrit 37.5 - 51.0 % 41.7  Platelets 150 - 450 x10E3/uL 244    BMP Latest Ref Rng & Units 02/19/2021 11/30/2020 11/15/2020  Glucose 70 - 99 mg/dL 95 87 81  BUN 8 - 27 mg/dL 12 15 13   Creatinine 0.76 - 1.27 mg/dL 1.63(H) 1.66(H) 1.71(H)  BUN/Creat Ratio 10 - 24 7(L) 9(L) 8(L)  Sodium 134 - 144 mmol/L 138 135 139  Potassium 3.5 - 5.2 mmol/L 4.6 4.8 4.9  Chloride 96 - 106 mmol/L 101 99 101  CO2 20 - 29 mmol/L 22 24 22   Calcium 8.6 - 10.2 mg/dL 9.7 9.7 10.0    BNP No results found for: BNP  ProBNP No results found for: PROBNP  PFT No results found for: FEV1PRE, FEV1POST, FVCPRE, FVCPOST, TLC, DLCOUNC, PREFEV1FVCRT, PSTFEV1FVCRT  CT CHEST LUNG CA SCREEN LOW DOSE W/O CM  Result Date: 02/12/2021 CLINICAL DATA:  Current smoker with 50 pack-year history EXAM: CT CHEST WITHOUT CONTRAST LOW-DOSE FOR LUNG CANCER SCREENING TECHNIQUE: Multidetector CT imaging of the chest was performed following the standard protocol without IV contrast. COMPARISON:  None. FINDINGS: Cardiovascular: Normal heart size. Coronary artery calcifications of the LAD. Mild atherosclerotic disease of the thoracic aorta. Mediastinum/Nodes: Lobulated anterior mediastinal mass measuring 5.4 (craniocaudal) 3.5 x 2.6 cm on series 2, image 28. No enlarged lymph nodes seen in the chest. Esophagus and thyroid are unremarkable. Linear nodular opacities are seen in the left lower lobe located along the fissure on image 112 measuring 7.4 mm in mean diameter and on image 247 measuring 6.3  mm in mean diameter. Additional small scattered solid pulmonary nodules are seen. Lungs/Pleura: Central airways are patent. Mild centrilobular emphysema. No consolidation, pleural effusion or pneumothorax. Upper Abdomen: No acute abnormality. Musculoskeletal: No chest wall mass or suspicious bone lesions identified.  IMPRESSION: 1. Linear nodular opacities are seen in the left lower lobe, likely scarring related to prior infection. Lung-RADS 3S, probably benign findings. Short-term follow-up in 6 months is recommended with repeat low-dose chest CT without contrast (please use the following order, CT CHEST LCS NODULE FOLLOW-UP W/O CM). S modifier for anterior mediastinal mass and coronary artery calcifications. 2. 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 Recommend further evaluation with contrast-enhanced mediastinal mass protocol MRI. 3. Mild coronary artery calcifications of the LAD, recommend ASCVD risk assessment. 4. Aortic Atherosclerosis (ICD10-I70.0) and Emphysema (ICD10-J43.9). These results will be called to the ordering clinician or representative by the Radiologist Assistant, and communication documented in the PACS or [employer]. Electronically Signed   By: Yetta Glassman M.D.   On: 02/12/2021 11:38     Past medical hx Past Medical History:  Diagnosis Date   Anxiety      Social History   Tobacco Use   Smoking status: Every Day    Packs/day: 1.00    Years: 50.00    Pack years: 50.00    Types: Cigarettes   Smokeless tobacco: Never  Vaping Use   Vaping Use: Never used  Substance Use Topics   Alcohol use: Yes    Alcohol/week: 2.0 standard drinks    Types: 2 Cans of beer per week   Drug use: Not Currently    Antonio Scott reports that he has been smoking cigarettes. He has a 50.00 pack-year smoking history. He has never used smokeless tobacco. He reports current alcohol use of about 2.0 standard drinks per week. He reports that he does not currently use drugs.  Tobacco Cessation: Current every day smoker with a 50 pack year smoking history.  I have spent 30  minutes counseling patient on smoking cessation this visit. We have reviewed the risks of continued smoking on his current health situation.  Patient verbalizes understanding of continuing to  smoke  and the negative health consequences including worsening of COPD, risk of lung cancer , stroke and heart disease.[name]     Past surgical hx, Family hx, Social hx all reviewed.  Current Outpatient Medications on File Prior to Visit  Medication Sig   levothyroxine (SYNTHROID) 25 MCG tablet Take 1 tablet (25 mcg total) by mouth daily.   No current facility-administered medications on file prior to visit.     No Known Allergies  Review Of Systems:  Constitutional:   No  weight loss, night sweats,  Fevers, chills, fatigue, or  lassitude.  HEENT:   No headaches,  Difficulty swallowing,  Tooth/dental problems, or  Sore throat,                No sneezing, itching, ear ache, nasal congestion, post nasal drip,   CV:  No chest pain,  Orthopnea, PND, swelling in lower extremities, anasarca, dizziness, palpitations, syncope.   GI  No heartburn, indigestion, abdominal pain, nausea, vomiting, diarrhea, change in bowel habits, loss of appetite, bloody stools.   Resp: No shortness of breath with exertion or at rest.  No excess mucus, no productive cough,  No non-productive cough,  No coughing up of blood.  No change in color of mucus.  No wheezing.  No  chest wall deformity  Skin: no rash or lesions.  GU: no dysuria, change in color of urine, no urgency or frequency.  No flank pain, no hematuria   MS:  No joint pain or swelling.  No decreased range of motion.  No back pain.  Psych:  No change in mood or affect. No depression or anxiety.  No memory loss.   Vital Signs BP 116/72    Pulse 65    Ht 5' 11.25" (1.81 m)    Wt 173 lb 14.4 oz (78.9 kg)    SpO2 95% Comment: on RA   BMI 24.08 kg/m    Physical Exam:  General- No distress,  A&Ox3, pleasant, smells strongly of smoke ENT: No sinus tenderness, TM clear, pale nasal mucosa, no oral exudate,no post nasal drip, no LAN Cardiac: S1, S2, regular rate and rhythm, no murmur Chest: No wheeze/  rales/ dullness; no accessory muscle use, no nasal flaring, no sternal retractions Abd.: Soft Non-tender, ND, BS +, Body mass index is 24.08 kg/m. 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 Plan We will order and schedule a PET scan We will order PFT's .  We will refer to thoracic surgery Please work on quitting smoking 1-800-QUIT NOW for free nicotine patches , gum or mints. Follow up after PET scan with Judson Roch NP.>> Place follow up for 4 weeks, if he gets a PET sooner we will get him in sooner.   Lung RADS 3 per lung cancer screening CT Chest Plan 6 month follow up low dose CT Chest  Tobacco Abuse Current Every Day smoker with a 50 pack year smoking history Plan I have spent 40  minutes counseling patient on smoking cessation this visit. We have reviewed the risks of continued smoking on his current health situation. Patient verbalizes understanding of their  choice to continue smoking and the negative health consequences including worsening of COPD, risk of lung cancer , stroke and heart disease.[facility]uz [facility] NOW for free nicotine replacement therapy ( Patches , gum or mints) Hypnosis for smoking cessation  [employer]. [phone]  Acupuncture for smoking cessation  [facility] [phone]   I spent ** minutes dedicated to the care of this patient on the date of this encounter to include pre-visit review of records, face-to-face time with the patient discussing conditions above, post visit ordering of testing, clinical documentation with the electronic health record, making appropriate referrals as documented, and communicating necessary information to  the patient's healthcare team.   Magdalen Spatz, NP 02/20/2021  3:01 PM

## 2021-02-19 NOTE — Progress Notes (Signed)
Subjective:  Patient ID: Ger Ringenberg, male    DOB: 06/02/1955, 66 y.o.   MRN: [id number]  Patient Care Team: Baruch Gouty, FNP as PCP - General (Family Medicine) Eloise Harman, DO as Consulting Physician (Gastroenterology)   Chief Complaint:  thyroid follow up   HPI: Aymen Widrig is a 66 y.o. male presenting on 02/19/2021 for thyroid follow up   Patient presents today for after initiation of levothyroxine for hypothyroidism.  He has been taking 25 mcg daily but did run out 3 days ago.  He does not denies any changes in energy level, hair, skin, nails, bowel habits, or heat or cold intolerance.  His cholesterol was also noted to be elevated at prior visit, patient has made dietary changes but has not started exercising on a regular basis.  He had a CT scanning of his chest that was ordered by pulmonology.  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  This imaging has not been completed to date.  Will order today.  Patient denies any symptomology.  He continues to smoke at least 1 pack of cigarettes per day and does not desire to quit.   Relevant past medical, surgical, family, and social history reviewed and updated as indicated.  Allergies and medications reviewed and updated. Data reviewed: Chart in Epic.   Past Medical History:  Diagnosis Date   Anxiety     Past Surgical History:  Procedure Laterality Date   SKIN LESION EXCISION      Social History   Socioeconomic History   Marital status: Single    Spouse name: Not on file   Number of children: Not on file   Years of education: Not on file   Highest education level: Not on file  Occupational History   Not on file  Tobacco Use   Smoking status: Every Day    Packs/day: 1.00    Years: 50.00    Pack years: 50.00    Types: Cigarettes   Smokeless tobacco: Never  Vaping Use   Vaping Use: Never used  Substance and Sexual Activity   Alcohol use:  Yes    Alcohol/week: 2.0 standard drinks    Types: 2 Cans of beer per week   Drug use: Not Currently   Sexual activity: Not Currently  Other Topics Concern   Not on file  Social History Narrative   Not on file   Social Determinants of Health   Financial Resource Strain: Not on file  Food Insecurity: Not on file  Transportation Needs: Not on file  Physical Activity: Not on file  Stress: Not on file  Social Connections: Not on file  Intimate Partner Violence: Not on file    Outpatient Encounter Medications as of 02/19/2021  Medication Sig   [DISCONTINUED] levothyroxine (SYNTHROID) 25 MCG tablet Take 1 tablet (25 mcg total) by mouth daily.   levothyroxine (SYNTHROID) 25 MCG tablet Take 1 tablet (25 mcg total) by mouth daily.   No facility-administered encounter medications on file as of 02/19/2021.    No Known Allergies  Review of Systems  Constitutional:  Negative for activity change, appetite change, chills, diaphoresis, fatigue, fever and unexpected weight change.  HENT: Negative.    Eyes: Negative.   Respiratory:  Negative for cough, chest tightness and shortness of breath.   Cardiovascular:  Negative for chest pain, palpitations and leg swelling.  Gastrointestinal:  Negative for abdominal pain, blood in stool, constipation, diarrhea,  nausea and vomiting.  Endocrine: Negative.   Genitourinary:  Negative for dysuria, frequency and urgency.  Musculoskeletal:  Negative for arthralgias and myalgias.  Skin: Negative.   Allergic/Immunologic: Negative.   Neurological:  Negative for dizziness, tremors, seizures, syncope, facial asymmetry, speech difficulty, weakness, light-headedness, numbness and headaches.  Hematological: Negative.   Psychiatric/Behavioral:  Negative for confusion, hallucinations, sleep disturbance and suicidal ideas.   All other systems reviewed and are negative.      Objective:  BP 122/81    Pulse (!) 57    Temp 98.4 F (36.9 C)    Ht 5' 11.25" (1.81 m)     Wt 176 lb (79.8 kg)    SpO2 97%    BMI 24.38 kg/m    Wt Readings from Last 3 Encounters:  02/19/21 176 lb (79.8 kg)  11/16/20 176 lb (79.8 kg)  11/15/20 176 lb (79.8 kg)    Physical Exam Vitals and nursing note reviewed.  Constitutional:      General: He is not in acute distress.    Appearance: Normal appearance. He is well-developed, well-groomed and normal weight. He is not ill-appearing, toxic-appearing or diaphoretic.  HENT:     Head: Normocephalic and atraumatic.     Jaw: There is normal jaw occlusion.     Right Ear: Hearing normal.     Left Ear: Hearing normal.     Nose: Nose normal.     Mouth/Throat:     Lips: Pink.     Mouth: Mucous membranes are moist.     Dentition: Abnormal dentition.     Pharynx: Oropharynx is clear. Uvula midline.  Eyes:     General: Lids are normal.     Extraocular Movements: Extraocular movements intact.     Conjunctiva/sclera: Conjunctivae normal.     Pupils: Pupils are equal, round, and reactive to light.  Neck:     Thyroid: No thyroid mass, thyromegaly or thyroid tenderness.     Vascular: No carotid bruit or JVD.     Trachea: Trachea and phonation normal.  Cardiovascular:     Rate and Rhythm: Normal rate and regular rhythm.     Chest Wall: PMI is not displaced.     Pulses: Normal pulses.     Heart sounds: Normal heart sounds. No murmur heard.   No friction rub. No gallop.  Pulmonary:     Effort: Pulmonary effort is normal. No respiratory distress.     Breath sounds: Normal breath sounds. No wheezing.  Abdominal:     General: There is no abdominal bruit.     Palpations: There is no hepatomegaly or splenomegaly.  Musculoskeletal:        General: Normal range of motion.     Cervical back: Normal range of motion and neck supple.     Right lower leg: No edema.     Left lower leg: No edema.  Lymphadenopathy:     Cervical: No cervical adenopathy.  Skin:    General: Skin is warm and dry.     Capillary Refill: Capillary refill takes  less than 2 seconds.     Coloration: Skin is not cyanotic, jaundiced or pale.     Findings: No rash.  Neurological:     General: No focal deficit present.     Mental Status: He is alert and oriented to person, place, and time.     Sensory: Sensation is intact.     Motor: Motor function is intact.     Coordination: Coordination is intact.  Gait: Gait is intact.     Deep Tendon Reflexes: Reflexes are normal and symmetric.  Psychiatric:        Attention and Perception: Attention and perception normal.        Mood and Affect: Mood and affect normal.        Speech: Speech normal.        Behavior: Behavior normal. Behavior is cooperative.        Thought Content: Thought content normal.        Cognition and Memory: Cognition and memory normal.        Judgment: Judgment normal.    Results for orders placed or performed in visit on 11/16/20  BMP8+EGFR  Result Value Ref Range   Glucose 87 70 - 99 mg/dL   BUN 15 8 - 27 mg/dL   Creatinine, Ser 1.66 (H) 0.76 - 1.27 mg/dL   eGFR 45 (L) >59 mL/min/1.73   BUN/Creatinine Ratio 9 (L) 10 - 24   Sodium 135 134 - 144 mmol/L   Potassium 4.8 3.5 - 5.2 mmol/L   Chloride 99 96 - 106 mmol/L   CO2 24 20 - 29 mmol/L   Calcium 9.7 8.6 - 10.2 mg/dL  Microalbumin / creatinine urine ratio  Result Value Ref Range   Creatinine, Urine 23.7 Not Estab. mg/dL   Microalbumin, Urine <3.0 Not Estab. ug/mL   Microalb/Creat Ratio <13 0 - 29 mg/g creat       Pertinent labs & imaging results that were available during my care of the patient were reviewed by me and considered in my medical decision making.  Assessment & Plan:  Upton was seen today for thyroid follow up.  Diagnoses and all orders for this visit:  Acquired hypothyroidism We will recheck labs today and adjust repletion therapy as warranted.  If medications were adjusted, we will see patient back in 3 months.  If levels are stable, will follow-up in 6 months. -     levothyroxine (SYNTHROID) 25  MCG tablet; Take 1 tablet (25 mcg total) by mouth daily. -     Thyroid Panel With TSH  Mixed hyperlipidemia Diet and exercise encouraged.  Labs pending, if cholesterol remains elevated will start statin therapy. -     Lipid panel  Elevated serum creatinine We will repeat labs today.  Adequate hydration discussed in detail. -     BMP8+EGFR  Mediastinal mass Noted on CT lung scan ordered by pulmonology.  Will order recommended MRI for further evaluation of mediastinal mass.  Referral pending results. -     MR CHEST MEDIASTINUM LIMITED; Future     Continue all other maintenance medications.  Follow up plan: Return in about 6 months (around 08/19/2021), or if symptoms worsen or fail to improve, for Thyroid.   Continue healthy lifestyle choices, including diet (rich in fruits, vegetables, and lean proteins, and low in salt and simple carbohydrates) and exercise (at least 30 minutes of moderate physical activity daily).  Educational handout given for hypothyroidism  The above assessment and management plan was discussed with the patient. The patient verbalized understanding of and has agreed to the management plan. Patient is aware to call the clinic if they develop any new symptoms or if symptoms persist or worsen. Patient is aware when to return to the clinic for a follow-up visit. Patient educated on when it is appropriate to go to the emergency department.   Monia Pouch, FNP-C [facility] [phone]

## 2021-02-20 ENCOUNTER — Ambulatory Visit: Payer: Medicare HMO | Admitting: Acute Care

## 2021-02-20 ENCOUNTER — Other Ambulatory Visit: Payer: Self-pay

## 2021-02-20 ENCOUNTER — Encounter: Payer: Self-pay | Admitting: Acute Care

## 2021-02-20 VITALS — BP 116/72 | HR 65 | Ht 71.25 in | Wt 173.9 lb

## 2021-02-20 DIAGNOSIS — J9859 Other diseases of mediastinum, not elsewhere classified: Secondary | ICD-10-CM | POA: Diagnosis not present

## 2021-02-20 DIAGNOSIS — F17210 Nicotine dependence, cigarettes, uncomplicated: Secondary | ICD-10-CM | POA: Diagnosis not present

## 2021-02-20 DIAGNOSIS — R69 Illness, unspecified: Secondary | ICD-10-CM | POA: Diagnosis not present

## 2021-02-20 LAB — LIPID PANEL
Chol/HDL Ratio: 3.3 ratio (ref 0.0–5.0)
Cholesterol, Total: 195 mg/dL (ref 100–199)
HDL: 59 mg/dL (ref >39)
LDL Chol Calc (NIH): 107 mg/dL — ABNORMAL HIGH (ref 0–99)
Triglycerides: 166 mg/dL — ABNORMAL HIGH (ref 0–149)
VLDL Cholesterol Cal: 29 mg/dL (ref 5–40)

## 2021-02-20 LAB — BMP8+EGFR
BUN/Creatinine Ratio: 7 — ABNORMAL LOW (ref 10–24)
BUN: 12 mg/dL (ref 8–27)
CO2: 22 mmol/L (ref 20–29)
Calcium: 9.7 mg/dL (ref 8.6–10.2)
Chloride: 101 mmol/L (ref 96–106)
Creatinine, Ser: 1.63 mg/dL — ABNORMAL HIGH (ref 0.76–1.27)
Glucose: 95 mg/dL (ref 70–99)
Potassium: 4.6 mmol/L (ref 3.5–5.2)
Sodium: 138 mmol/L (ref 134–144)
eGFR: 46 mL/min/{1.73_m2} — ABNORMAL LOW (ref >59)

## 2021-02-20 LAB — THYROID PANEL WITH TSH
Free Thyroxine Index: 1.5 (ref 1.2–4.9)
T3 Uptake Ratio: 28 % (ref 24–39)
T4, Total: 5.5 ug/dL (ref 4.5–12.0)
TSH: 10.6 u[IU]/mL — ABNORMAL HIGH (ref 0.450–4.500)

## 2021-02-20 NOTE — Patient Instructions (Addendum)
It is good to see you today. We will place an order for a PET scan to better evaluate the mediastinal mass we saw on your screening CT.  We will also get PFT's We will refer you to thoracic surgery to be evaluated.  Please work on quitting smoking 1-800-QUIT NOW for free nicotine patches , gum or mints. Follow up after PET scan with Judson Roch NP.>> Place follow up for 4 weeks, if he gets a PET sooner we will get him in sooner.  Please contact office for sooner follow up if symptoms do not improve or worsen or seek emergency care

## 2021-02-20 NOTE — Addendum Note (Signed)
Addended by: Baruch Gouty on: 02/20/2021 12:51 PM   Modules accepted: Orders

## 2021-02-25 ENCOUNTER — Ambulatory Visit (INDEPENDENT_AMBULATORY_CARE_PROVIDER_SITE_OTHER): Payer: Medicare HMO

## 2021-02-25 ENCOUNTER — Telehealth: Payer: Self-pay | Admitting: Family Medicine

## 2021-02-25 ENCOUNTER — Telehealth: Payer: Self-pay | Admitting: Acute Care

## 2021-02-25 VITALS — Ht 71.0 in | Wt 175.0 lb

## 2021-02-25 DIAGNOSIS — Z0000 Encounter for general adult medical examination without abnormal findings: Secondary | ICD-10-CM | POA: Diagnosis not present

## 2021-02-25 NOTE — Telephone Encounter (Signed)
CALLED PATIENT AND INFORMED HIM THAT THE PHONE CALLS WERE REGUARDING HIS APPOINTMENT WITH [place] THAT WAS ON 02/18/21. PT STATED UNDERSTANDING AND HAD NO QUESTIONS. NOTHING FURTHER

## 2021-02-25 NOTE — Progress Notes (Signed)
Subjective:   Antonio Scott is a 66 y.o. male who presents for an Initial Medicare Annual Wellness Visit.  Virtual Visit via Telephone Note  I connected with  Antonio Scott on 02/25/21 at  3:30 PM EST by telephone and verified that I am speaking with the correct person using two identifiers.  Location: Patient: Home Provider: [facility] Persons participating in the virtual visit: patient/Nurse Health Advisor   I discussed the limitations, risks, security and privacy concerns of performing an evaluation and management service by telephone and the availability of in person appointments. The patient expressed understanding and agreed to proceed.  Interactive audio and video telecommunications were attempted between this nurse and patient, however failed, due to patient having technical difficulties OR patient did not have access to video capability.  We continued and completed visit with audio only.  Some vital signs may be absent or patient reported.   Alanya Vukelich E Icey Tello, LPN   Review of Systems     Cardiac Risk Factors include: advanced age (>68men, >61 women);male gender;dyslipidemia;smoking/ tobacco exposure     Objective:    Today's Vitals   02/25/21 1528  Weight: 175 lb (79.4 kg)  Height: 5\' 11"  (1.803 m)   Body mass index is 24.41 kg/m.  Advanced Directives 02/25/2021  Does Patient Have a Medical Advance Directive? No  Would patient like information on creating a medical advance directive? No - Patient declined    Current Medications (verified) Outpatient Encounter Medications as of 02/25/2021  Medication Sig   levothyroxine (SYNTHROID) 25 MCG tablet Take 1 tablet (25 mcg total) by mouth daily.   No facility-administered encounter medications on file as of 02/25/2021.    Allergies (verified) Patient has no known allergies.   History: Past Medical History:  Diagnosis Date   Anxiety    Past Surgical History:  Procedure Laterality Date   SKIN LESION EXCISION      Family History  Problem Relation Age of Onset   Stroke Mother    Diabetes Mother    Cancer Father    Cancer Brother    Social History   Socioeconomic History   Marital status: Single    Spouse name: Not on file   Number of children: 0   Years of education: Not on file   Highest education level: Not on file  Occupational History   Occupation: retired  Tobacco Use   Smoking status: Every Day    Packs/day: 1.00    Years: 50.00    Pack years: 50.00    Types: Cigarettes   Smokeless tobacco: Never  Vaping Use   Vaping Use: Never used  Substance and Sexual Activity   Alcohol use: Not Currently   Drug use: Not Currently   Sexual activity: Not Currently  Other Topics Concern   Not on file  Social History Narrative   No children   Brother lives a mile away   Social Determinants of Health   Financial Resource Strain: Low Risk    Difficulty of Paying Living Expenses: Not hard at all  Food Insecurity: No Food Insecurity   Worried About Charity fundraiser in the Last Year: Never true   Arboriculturist in the Last Year: Never true  Transportation Needs: No Transportation Needs   Lack of Transportation (Medical): No   Lack of Transportation (Non-Medical): No  Physical Activity: Sufficiently Active   Days of Exercise per Week: 6 days   Minutes of Exercise per Session: 30 min  Stress: No Stress  Concern Present   Feeling of Stress : Only a little  Social Connections: Socially Isolated   Frequency of Communication with Friends and Family: Twice a week   Frequency of Social Gatherings with Friends and Family: Once a week   Attends Religious Services: Never   Printmaker: No   Attends Music therapist: Never   Marital Status: Never married    Tobacco Counseling Ready to quit: Not Answered Counseling given: Not Answered   Clinical Intake:  Pre-visit preparation completed: Yes  Pain : No/denies pain     BMI - recorded:  24.41 Nutritional Status: BMI of 19-24  Normal Nutritional Risks: None Diabetes: No  How often do you need to have someone help you when you read instructions, pamphlets, or other written materials from your doctor or pharmacy?: 1 - Never  Diabetic?no  Interpreter Needed?: No  Information entered by :: Rayquon Uselman, LPN   Activities of Daily Living In your present state of health, do you have any difficulty performing the following activities: 02/25/2021  Hearing? N  Vision? N  Difficulty concentrating or making decisions? N  Walking or climbing stairs? N  Dressing or bathing? N  Doing errands, shopping? N  Preparing Food and eating ? N  Using the Toilet? N  In the past six months, have you accidently leaked urine? N  Do you have problems with loss of bowel control? N  Managing your Medications? N  Managing your Finances? N  Housekeeping or managing your Housekeeping? N  Some recent data might be hidden    Patient Care Team: Rakes, Connye Burkitt, FNP as PCP - General (Family Medicine) Eloise Harman, DO as Consulting Physician (Gastroenterology)  Indicate any recent Medical Services you may have received from other than [facility] providers in the past year (date may be approximate).     Assessment:   This is a routine wellness examination for Antonio Scott.  Hearing/Vision screen Hearing Screening - Comments:: Denies hearing difficulties  Vision Screening - Comments:: Wears glasses for reading prn - behind on annual eye exams - no eye doctor at this time  Dietary issues and exercise activities discussed: Current Exercise Habits: Home exercise routine, Type of exercise: walking;Other - see comments (cutting wood, house and yard work), Time (Minutes): 30, Frequency (Times/Week): 6, Weekly Exercise (Minutes/Week): 180, Intensity: Moderate, Exercise limited by: None identified   Goals Addressed             This Visit's Progress    Quit Smoking         Depression Screen PHQ 2/9  Scores 02/25/2021 02/19/2021 11/16/2020  PHQ - 2 Score 0 0 0  PHQ- 9 Score 0 0 2    Fall Risk Fall Risk  02/25/2021 02/19/2021 11/16/2020  Falls in the past year? 0 0 0  Number falls in past yr: 0 - -  Injury with Fall? 0 - -  Risk for fall due to : No Fall Risks - -  Follow up Falls prevention discussed - -    FALL Chambers:  Any stairs in or around the home? Yes  If so, are there any without handrails? No  Home free of loose throw rugs in walkways, pet beds, electrical cords, etc? Yes  Adequate lighting in your home to reduce risk of falls? Yes   ASSISTIVE DEVICES UTILIZED TO PREVENT FALLS:  Life alert? No  Use of a cane, walker or w/c? No  Grab bars  in the bathroom? Yes  Shower chair or bench in shower? No  Elevated toilet seat or a handicapped toilet? No   TIMED UP AND GO:  Was the test performed? No . Telephonic visit   Cognitive Function:     6CIT Screen 02/25/2021  What Year? 0 points  What month? 0 points  What time? 0 points  Count back from 20 0 points  Months in reverse 0 points  Repeat phrase 0 points  Total Score 0    Immunizations Immunization History  Administered Date(s) Administered   Fluad Quad(high Dose 65+) 11/15/2020   Tdap 08/17/2018   Zoster Recombinat (Shingrix) 12/16/2020    TDAP status: Up to date  Flu Vaccine status: Up to date  Pneumococcal vaccine status: Due, Education has been provided regarding the importance of this vaccine. Advised may receive this vaccine at local pharmacy or Health Dept. Aware to provide a copy of the vaccination record if obtained from local pharmacy or Health Dept. Verbalized acceptance and understanding.  Covid-19 vaccine status: Completed vaccines  Qualifies for Shingles Vaccine? Yes   Zostavax completed No   Shingrix Completed?: No.    Education has been provided regarding the importance of this vaccine. Patient has been advised to call insurance company to determine out  of pocket expense if they have not yet received this vaccine. Advised may also receive vaccine at local pharmacy or Health Dept. Verbalized acceptance and understanding.  Screening Tests Health Maintenance  Topic Date Due   COLONOSCOPY (Pts 45-45yrs Insurance coverage will need to be confirmed)  Never done   COVID-19 Vaccine (1) 03/07/2021 (Originally 01/29/1956)   Zoster Vaccines- Shingrix (2 of 2) 04/05/2021 (Originally 02/10/2021)   Pneumonia Vaccine 61+ Years old (1 - PCV) 02/19/2022 (Originally 07/29/1961)   TETANUS/TDAP  08/16/2028   INFLUENZA VACCINE  Completed   Hepatitis C Screening  Completed   HPV VACCINES  Aged Out    Health Maintenance  Health Maintenance Due  Topic Date Due   COLONOSCOPY (Pts 45-11yrs Insurance coverage will need to be confirmed)  Never done    Colorectal cancer screening - due - declined  Lung Cancer Screening: (Low Dose CT Chest recommended if Age 8-80 years, 30 pack-year currently smoking OR have quit w/in 15years.) does qualify.   Lung Cancer Screening Referral: already done- is   Additional Screening:  Hepatitis C Screening: does qualify; Completed 11/15/2020  Vision Screening: Recommended annual ophthalmology exams for early detection of glaucoma and other disorders of the eye. Is the patient up to date with their annual eye exam?  No  Who is the provider or what is the name of the office in which the patient attends annual eye exams? none If pt is not established with a provider, would they like to be referred to a provider to establish care? No .   Dental Screening: Recommended annual dental exams for proper oral hygiene  Community Resource Referral / Chronic Care Management: CRR required this visit?  No   CCM required this visit?  No      Plan:     I have personally reviewed and noted the following in the patients chart:   Medical and social history Use of alcohol, tobacco or illicit drugs  Current medications and supplements  including opioid prescriptions. Patient is not currently taking opioid prescriptions. Functional ability and status Nutritional status Physical activity Advanced directives List of other physicians Hospitalizations, surgeries, and ER visits in previous 12 months Vitals Screenings to include cognitive, depression, and  falls Referrals and appointments  In addition, I have reviewed and discussed with patient certain preventive protocols, quality metrics, and best practice recommendations. A written personalized care plan for preventive services as well as general preventive health recommendations were provided to patient.     Sandrea Hammond, LPN   [id number]   Nurse Notes: wanted to let you know he is scheduled for MRI and PET scans soon - thinks he may have lung cancer.

## 2021-02-25 NOTE — Patient Instructions (Signed)
Antonio Scott , Thank you for taking time to come for your Medicare Wellness Visit. I appreciate your ongoing commitment to your health goals. Please review the following plan we discussed and let me know if I can assist you in the future.   Screening recommendations/referrals: Colonoscopy: Due - declines at this time Recommended yearly ophthalmology/optometry visit for glaucoma screening and checkup Recommended yearly dental visit for hygiene and checkup  Vaccinations: Influenza vaccine: Done 11/15/2020 - Repeat annually  Pneumococcal vaccine: Due Tdap vaccine: Done 08/17/2018 - Repeat in 10 years  Shingles vaccine: Done 01/15/2021? - getting second dose 04/05/21?  -pharmacy Covid-19: Done 05/19/2019 ,06/10/2019, 01/13/2020, & 11/16/2020  Advanced directives: Advance directive discussed with you today. Even though you declined this today, please call our office should you change your mind, and we can give you the proper paperwork for you to fill out.   Conditions/risks identified: Aim for 30 minutes of exercise or brisk walking each day, drink 6-8 glasses of water and eat lots of fruits and vegetables.   If you wish to quit smoking, help is available. For free tobacco cessation program offerings call the [facility] at 610-834-3616 or [facility] Well [facility] at 2150727901. You may also visit www.[email or web address] or email [email or web address] .com for more information on other programs.   You may also call 1-800-QUIT-NOW (804)590-6704) or visit www.[employer].dk or www.BecomeAnEx.org for additional resources on smoking cessation.    Next appointment: Follow up in one year for your annual wellness visit.   Preventive Care 63 Years and Older, Male  Preventive care refers to lifestyle choices and visits with your health care provider that can promote health and wellness. What does preventive care include? A yearly physical exam. This is also called an annual well check. Dental exams  once or twice a year. Routine eye exams. Ask your health care provider how often you should have your eyes checked. Personal lifestyle choices, including: Daily care of your teeth and gums. Regular physical activity. Eating a healthy diet. Avoiding tobacco and drug use. Limiting alcohol use. Practicing safe sex. Taking low doses of aspirin every day. Taking vitamin and mineral supplements as recommended by your health care provider. What happens during an annual well check? The services and screenings done by your health care provider during your annual well check will depend on your age, overall health, lifestyle risk factors, and family history of disease. Counseling  Your health care provider may ask you questions about your: Alcohol use. Tobacco use. Drug use. Emotional well-being. Home and relationship well-being. Sexual activity. Eating habits. History of falls. Memory and ability to understand (cognition). Work and work Statistician. Screening  You may have the following tests or measurements: Height, weight, and BMI. Blood pressure. Lipid and cholesterol levels. These may be checked every 5 years, or more frequently if you are over 68 years old. Skin check. Lung cancer screening. You may have this screening every year starting at age 48 if you have a 30-pack-year history of smoking and currently smoke or have quit within the past 15 years. Fecal occult blood test (FOBT) of the stool. You may have this test every year starting at age 53. Flexible sigmoidoscopy or colonoscopy. You may have a sigmoidoscopy every 5 years or a colonoscopy every 10 years starting at age 24. Prostate cancer screening. Recommendations will vary depending on your family history and other risks. Hepatitis C blood test. Hepatitis B blood test. Sexually transmitted disease (STD) testing. Diabetes screening. This is done by checking  your blood sugar (glucose) after you have not eaten for a while  (fasting). You may have this done every 1-3 years. Abdominal aortic aneurysm (AAA) screening. You may need this if you are a current or former smoker. Osteoporosis. You may be screened starting at age 68 if you are at high risk. Talk with your health care provider about your test results, treatment options, and if necessary, the need for more tests. Vaccines  Your health care provider may recommend certain vaccines, such as: Influenza vaccine. This is recommended every year. Tetanus, diphtheria, and acellular pertussis (Tdap, Td) vaccine. You may need a Td booster every 10 years. Zoster vaccine. You may need this after age 54. Pneumococcal 13-valent conjugate (PCV13) vaccine. One dose is recommended after age 50. Pneumococcal polysaccharide (PPSV23) vaccine. One dose is recommended after age 53. Talk to your health care provider about which screenings and vaccines you need and how often you need them. This information is not intended to replace advice given to you by your health care provider. Make sure you discuss any questions you have with your health care provider. Document Released: 02/16/2015 Document Revised: 10/10/2015 Document Reviewed: 11/21/2014 Elsevier Interactive Patient Education  2017 [employer] Prevention in the Home Falls can cause injuries. They can happen to people of all ages. There are many things you can do to make your home safe and to help prevent falls. What can I do on the outside of my home? Regularly fix the edges of walkways and driveways and fix any cracks. Remove anything that might make you trip as you walk through a door, such as a raised step or threshold. Trim any bushes or trees on the path to your home. Use bright outdoor lighting. Clear any walking paths of anything that might make someone trip, such as rocks or tools. Regularly check to see if handrails are loose or broken. Make sure that both sides of any steps have handrails. Any raised  decks and porches should have guardrails on the edges. Have any leaves, snow, or ice cleared regularly. Use sand or salt on walking paths during winter. Clean up any spills in your garage right away. This includes oil or grease spills. What can I do in the bathroom? Use night lights. Install grab bars by the toilet and in the tub and shower. Do not use towel bars as grab bars. Use non-skid mats or decals in the tub or shower. If you need to sit down in the shower, use a plastic, non-slip stool. Keep the floor dry. Clean up any water that spills on the floor as soon as it happens. Remove soap buildup in the tub or shower regularly. Attach bath mats securely with double-sided non-slip rug tape. Do not have throw rugs and other things on the floor that can make you trip. What can I do in the bedroom? Use night lights. Make sure that you have a light by your bed that is easy to reach. Do not use any sheets or blankets that are too big for your bed. They should not hang down onto the floor. Have a firm chair that has side arms. You can use this for support while you get dressed. Do not have throw rugs and other things on the floor that can make you trip. What can I do in the kitchen? Clean up any spills right away. Avoid walking on wet floors. Keep items that you use a lot in easy-to-reach places. If you need to reach something  above you, use a strong step stool that has a grab bar. Keep electrical cords out of the way. Do not use floor polish or wax that makes floors slippery. If you must use wax, use non-skid floor wax. Do not have throw rugs and other things on the floor that can make you trip. What can I do with my stairs? Do not leave any items on the stairs. Make sure that there are handrails on both sides of the stairs and use them. Fix handrails that are broken or loose. Make sure that handrails are as long as the stairways. Check any carpeting to make sure that it is firmly attached  to the stairs. Fix any carpet that is loose or worn. Avoid having throw rugs at the top or bottom of the stairs. If you do have throw rugs, attach them to the floor with carpet tape. Make sure that you have a light switch at the top of the stairs and the bottom of the stairs. If you do not have them, ask someone to add them for you. What else can I do to help prevent falls? Wear shoes that: Do not have high heels. Have rubber bottoms. Are comfortable and fit you well. Are closed at the toe. Do not wear sandals. If you use a stepladder: Make sure that it is fully opened. Do not climb a closed stepladder. Make sure that both sides of the stepladder are locked into place. Ask someone to hold it for you, if possible. Clearly mark and make sure that you can see: Any grab bars or handrails. First and last steps. Where the edge of each step is. Use tools that help you move around (mobility aids) if they are needed. These include: Canes. Walkers. Scooters. Crutches. Turn on the lights when you go into a dark area. Replace any light bulbs as soon as they burn out. Set up your furniture so you have a clear path. Avoid moving your furniture around. If any of your floors are uneven, fix them. If there are any pets around you, be aware of where they are. Review your medicines with your doctor. Some medicines can make you feel dizzy. This can increase your chance of falling. Ask your doctor what other things that you can do to help prevent falls. This information is not intended to replace advice given to you by your health care provider. Make sure you discuss any questions you have with your health care provider. Document Released: 11/16/2008 Document Revised: 06/28/2015 Document Reviewed: 02/24/2014 Elsevier Interactive Patient Education  2017 [employer].

## 2021-02-25 NOTE — Telephone Encounter (Signed)
Patient was confused about his referral and called back to talk to [facility]

## 2021-03-04 NOTE — Progress Notes (Signed)
[address]       [place] [address]             [phone]                    [name]  Medical Record #[id number] Date of Birth: 07-23-1955  Referring: Baruch Gouty, FNP Primary Care: Baruch Gouty, FNP Primary Cardiologist: None  Chief Complaint:   No chief complaint on file.   History of Present Illness:    Antonio Scott 66 y.o. male with incidental finding for an anterior mediastinal mass.  He is being followed in the lung cancer screening program.  He does endorse some chest fullness and occasional sharp chest pain.  This is not associated with any activity or exertion.  He also admits to some visual defects on either eye.  This usually is relieved after rubbing it for few minutes.  He denies any other ocular symptoms or respiratory symptoms.  He informed me that his brother died of thymic cancer in April 22, 2017.  He also states that his father had an abnormal growth off of his aorta.  He is currently being treated with Synthroid for hypothyroidism.      Zubrod Score: At the time of surgery this patients most appropriate activity status/level should be described as: [x]     0    Normal activity, no symptoms []     1    Restricted in physical strenuous activity but ambulatory, able to do out light work []     2    Ambulatory and capable of self care, unable to do work activities, up and about               >50 % of waking hours                              []     3    Only limited self care, in bed greater than 50% of waking hours []     4    Completely disabled, no self care, confined to bed or chair []     5    Moribund   Past Medical History:  Diagnosis Date   Anxiety     Past Surgical History:  Procedure Laterality Date   SKIN LESION EXCISION      Family History  Problem Relation Age of Onset   Stroke Mother    Diabetes Mother    Cancer Father    Cancer Brother      Social History   Tobacco Use  Smoking Status Every Day    Packs/day: 1.00   Years: 50.00   Pack years: 50.00   Types: Cigarettes  Smokeless Tobacco Never    Social History   Substance and Sexual Activity  Alcohol Use Not Currently     No Known Allergies  Current Outpatient Medications  Medication Sig Dispense Refill   levothyroxine (SYNTHROID) 25 MCG tablet Take 1 tablet (25 mcg total) by mouth daily. 90 tablet 3   No current facility-administered medications for this visit.    Review of Systems  Constitutional:  Negative for chills, fever, malaise/fatigue and weight loss.  Eyes:  Positive for blurred vision.  Respiratory:  Positive for cough and sputum production.   Cardiovascular:  Positive for chest pain. Negative for palpitations.  Neurological: Negative.     PHYSICAL EXAMINATION: There were no vitals taken for this visit.  Physical Exam Constitutional:      Appearance: Normal appearance. He is normal weight. He is not ill-appearing.  HENT:     Head: Normocephalic and atraumatic.  Eyes:     Extraocular Movements: Extraocular movements intact.  Cardiovascular:     Rate and Rhythm: Normal rate.     Heart sounds: No murmur heard. Pulmonary:     Effort: Pulmonary effort is normal. No respiratory distress.  Abdominal:     General: Abdomen is flat. There is no distension.  Musculoskeletal:        General: Normal range of motion.     Cervical back: Normal range of motion.  Skin:    General: Skin is warm and dry.  Neurological:     General: No focal deficit present.     Mental Status: He is alert and oriented to person, place, and time.    Diagnostic Studies & Laboratory data:     Recent Radiology Findings:   CT CHEST LUNG CA SCREEN LOW DOSE W/O CM  Result Date: 02/12/2021 CLINICAL DATA:  Current smoker with 50 pack-year history EXAM: CT CHEST WITHOUT CONTRAST LOW-DOSE FOR LUNG CANCER SCREENING TECHNIQUE: Multidetector CT imaging of the chest was performed following the standard protocol without IV contrast.  COMPARISON:  None. FINDINGS: Cardiovascular: Normal heart size. Coronary artery calcifications of the LAD. Mild atherosclerotic disease of the thoracic aorta. Mediastinum/Nodes: Lobulated anterior mediastinal mass measuring 5.4 (craniocaudal) 3.5 x 2.6 cm on series 2, image 28. No enlarged lymph nodes seen in the chest. Esophagus and thyroid are unremarkable. Linear nodular opacities are seen in the left lower lobe located along the fissure on image 112 measuring 7.4 mm in mean diameter and on image 247 measuring 6.3 mm in mean diameter. Additional small scattered solid pulmonary nodules are seen. Lungs/Pleura: Central airways are patent. Mild centrilobular emphysema. No consolidation, pleural effusion or pneumothorax. Upper Abdomen: No acute abnormality. Musculoskeletal: No chest wall mass or suspicious bone lesions identified. IMPRESSION: 1. Linear nodular opacities are seen in the left lower lobe, likely scarring related to prior infection. Lung-RADS 3S, probably benign findings. Short-term follow-up in 6 months is recommended with repeat low-dose chest CT without contrast (please use the following order, CT CHEST LCS NODULE FOLLOW-UP W/O CM). S modifier for anterior mediastinal mass and coronary artery calcifications. 2. 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 Recommend further evaluation with contrast-enhanced mediastinal mass protocol MRI. 3. Mild coronary artery calcifications of the LAD, recommend ASCVD risk assessment. 4. Aortic Atherosclerosis (ICD10-I70.0) and Emphysema (ICD10-J43.9). These results will be called to the ordering clinician or representative by the Radiologist Assistant, and communication documented in the PACS or [employer]. Electronically Signed   By: Yetta Glassman M.D.   On: 02/12/2021 11:38       I have independently reviewed the above radiology studies  and reviewed the findings with  the patient.   Recent Lab Findings: Lab Results  Component Value Date   WBC 6.4 11/15/2020   HGB 13.9 11/15/2020   HCT 41.7 11/15/2020   PLT 244 11/15/2020   GLUCOSE 95 02/19/2021   CHOL 195 02/19/2021   TRIG 166 (H) 02/19/2021   HDL 59 02/19/2021   LDLCALC 107 (H) 02/19/2021   ALT 18 11/15/2020   AST 21 11/15/2020   NA 138 02/19/2021   K 4.6 02/19/2021   CL 101 02/19/2021   CREATININE 1.63 (H) 02/19/2021   BUN 12 02/19/2021   CO2 22  02/19/2021   TSH 10.600 (H) 02/19/2021     Problem List: 5.4 centimeters anterior mediastinal mass.  Increased uptake on PET/CT Increased avidity on thyroid Family history of thymic cancer resulting in his brother's death.  He also states that his father has a history of an abnormal growth on his aorta.     Assessment / Plan:   66 yo male with anterior mediastinal mass.  He also has a family history of thymic cancer and intermittent blurred vision that is been present for over 20 years..  I have ordered tumor markers.  I personally reviewed his MRI chest and PET/CT.  I did note increased avidity within thymic gland and the thyroid.  I did not see any abnormal uptake in any of his nodal basins.  The final results are not available yet.  Will see him back after his tumor markers have resulted.  He likely will require a robotic assisted all of his tumor markers are negative.      I  spent 40 minutes with  the patient face to face in counseling and coordination of care.    Lajuana Matte 03/04/2021 7:24 AM

## 2021-03-06 ENCOUNTER — Other Ambulatory Visit: Payer: Self-pay

## 2021-03-06 ENCOUNTER — Ambulatory Visit (HOSPITAL_COMMUNITY): Admission: RE | Admit: 2021-03-06 | Payer: Medicare HMO | Source: Ambulatory Visit

## 2021-03-06 ENCOUNTER — Ambulatory Visit (HOSPITAL_COMMUNITY)
Admission: RE | Admit: 2021-03-06 | Discharge: 2021-03-06 | Disposition: A | Discharge status: Home / Self Care | Payer: Medicare HMO | Source: Ambulatory Visit | Attending: Family Medicine | Admitting: Family Medicine

## 2021-03-06 DIAGNOSIS — J9859 Other diseases of mediastinum, not elsewhere classified: Secondary | ICD-10-CM | POA: Insufficient documentation

## 2021-03-06 MED ORDER — GADOBUTROL 1 MMOL/ML IV SOLN
8.0000 mL | Freq: Once | INTRAVENOUS | Status: AC | PRN
  Administered 2021-03-06: 8 mL via INTRAVENOUS

## 2021-03-07 ENCOUNTER — Ambulatory Visit (HOSPITAL_COMMUNITY)
Admission: RE | Admit: 2021-03-07 | Discharge: 2021-03-07 | Disposition: A | Discharge status: Home / Self Care | Payer: Medicare HMO | Source: Ambulatory Visit | Attending: Acute Care | Admitting: Acute Care

## 2021-03-07 DIAGNOSIS — J9859 Other diseases of mediastinum, not elsewhere classified: Secondary | ICD-10-CM | POA: Diagnosis not present

## 2021-03-07 MED ORDER — FLUDEOXYGLUCOSE F - 18 (FDG) INJECTION
9.3950 | Freq: Once | INTRAVENOUS | Status: AC
  Administered 2021-03-07: 9.395 via INTRAVENOUS

## 2021-03-08 ENCOUNTER — Institutional Professional Consult (permissible substitution): Payer: Medicare HMO | Admitting: Thoracic Surgery (Cardiothoracic Vascular Surgery)

## 2021-03-08 ENCOUNTER — Other Ambulatory Visit: Payer: Self-pay | Admitting: *Deleted

## 2021-03-08 ENCOUNTER — Other Ambulatory Visit: Payer: Self-pay

## 2021-03-08 VITALS — BP 139/85 | HR 72 | Resp 20 | Ht 71.0 in | Wt 175.0 lb

## 2021-03-08 DIAGNOSIS — J9859 Other diseases of mediastinum, not elsewhere classified: Secondary | ICD-10-CM

## 2021-03-15 ENCOUNTER — Ambulatory Visit: Payer: PRIVATE HEALTH INSURANCE | Admitting: Thoracic Surgery (Cardiothoracic Vascular Surgery)

## 2021-03-15 ENCOUNTER — Other Ambulatory Visit: Payer: Self-pay

## 2021-03-15 LAB — HCG, TOTAL, QUANTITATIVE: hCG, Beta Chain, Quant, S: <3 m[IU]/mL (ref <5)

## 2021-03-15 NOTE — Progress Notes (Signed)
°   °  [address]       [place] [address]             [phone]       Patient: Home Provider: Office Consent for Telemedicine visit obtained.  Todays visit was completed via a real-time telehealth (see specific modality noted below). The patient/authorized person provided oral consent at the time of the visit to engage in a telemedicine encounter with the present provider at [facility]. The patient/authorized person was informed of the potential benefits, limitations, and risks of telemedicine. The patient/authorized person expressed understanding that the laws that protect confidentiality also apply to telemedicine. The patient/authorized person acknowledged understanding that telemedicine does not provide emergency services and that he or she would need to call 911 or proceed to the nearest hospital for help if such a need arose.   Total time spent in the clinical discussion 10 minutes.  Telehealth Modality: Phone visit (audio only)  I had a telephone visit with Mr. Newsome.  His tumor markers have not resulted.  His beta-hCG was negative, but I am awaiting his AFP and LDH.  Once these have resulted I will give him a call back to follow-up.  I do think that he is likely best served with surgical resection based on the MRI findings.  We will discuss this further next week.

## 2021-03-18 ENCOUNTER — Other Ambulatory Visit: Payer: Self-pay | Admitting: Thoracic Surgery (Cardiothoracic Vascular Surgery)

## 2021-03-18 ENCOUNTER — Other Ambulatory Visit: Payer: Self-pay | Admitting: *Deleted

## 2021-03-18 DIAGNOSIS — J9859 Other diseases of mediastinum, not elsewhere classified: Secondary | ICD-10-CM | POA: Diagnosis not present

## 2021-03-19 LAB — LACTATE DEHYDROGENASE: LDH: 230 U/L (ref 120–250)

## 2021-03-19 LAB — T4: T4, Total: 6.5 ug/dL (ref 4.9–10.5)

## 2021-03-19 LAB — TSH: TSH: 8.6 mIU/L — ABNORMAL HIGH (ref 0.40–4.50)

## 2021-03-19 LAB — AFP TUMOR MARKER: AFP-Tumor Marker: 6.3 ng/mL — ABNORMAL HIGH (ref <6.1)

## 2021-03-22 ENCOUNTER — Other Ambulatory Visit: Payer: Self-pay

## 2021-03-22 ENCOUNTER — Ambulatory Visit: Payer: Medicare HMO | Admitting: Acute Care

## 2021-03-22 ENCOUNTER — Encounter: Payer: Self-pay | Admitting: Acute Care

## 2021-03-22 ENCOUNTER — Ambulatory Visit (INDEPENDENT_AMBULATORY_CARE_PROVIDER_SITE_OTHER): Payer: Medicare HMO | Admitting: Thoracic Surgery (Cardiothoracic Vascular Surgery)

## 2021-03-22 VITALS — BP 118/88 | HR 58 | Temp 97.7°F | Ht 71.0 in | Wt 176.4 lb

## 2021-03-22 DIAGNOSIS — Z720 Tobacco use: Secondary | ICD-10-CM

## 2021-03-22 DIAGNOSIS — R69 Illness, unspecified: Secondary | ICD-10-CM | POA: Diagnosis not present

## 2021-03-22 DIAGNOSIS — F17210 Nicotine dependence, cigarettes, uncomplicated: Secondary | ICD-10-CM

## 2021-03-22 DIAGNOSIS — J9859 Other diseases of mediastinum, not elsewhere classified: Secondary | ICD-10-CM

## 2021-03-22 NOTE — Progress Notes (Signed)
History of Present Illness Antonio Scott is a 66 y.o. male current every day smoker with incidental finding of thymic mass on screening CT Chest. He is being followed by the screening program. .    03/22/2021 Follow up for anterior mediastinal mass on Lung Cancer Screening.  Pt. Is enrolled in the lung cancer screening program. His LDCT was read as a Lung  RADS 3, nodules that are probably benign findings, short term follow up suggested: includes nodules with a low likelihood of becoming a clinically active cancer. Radiology recommends a 6 month repeat LDCT follow up. 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 The patient has subsequently had a PET scan , which shows Moderate hypermetabolism corresponding to the anterior mediastinal mass, as well as some thyroid hypermetabolism and an area that is  atypically positioned mildly hypermetabolic node within the fat anterior to the larynx.  Pt has been seen by Dr. Kipp Brood, who is waiting on AFP and LDH labwork before determining plan for  for surgical resection. Dr. Kipp Brood is calling the patient today at 3 pm to discuss lab results and plan for treatment. He said he is open to surgical options. He states he does wheeze , but overall his breathing is good. He does not feel he needs an inhaler.  Once he gets through his surgery, we need to do a formal work up for COPD . I will do PFT's as they may be helpful in regard to surgery.    Test Results: PET Scan 03/07/2021 Moderate hypermetabolism corresponding to the anterior mediastinal mass. Differential considerations include thymic neoplasm or lymphoma. Recommend tissue sampling. 2. Diffuse thyroid hypermetabolism can be seen with thyroiditis. Consider correlation with thyroid function tests. 3. Muscular activity versus an atypically positioned mildly hypermetabolic node  within the fat anterior to the larynx. Nonspecific. 4. Infrarenal abdominal aortic dilatation at maximally 3.6 cm. Recommend follow-up ultrasound every 2 years. This recommendation follows ACR consensus guidelines: White Paper of the ACR Incidental Findings Committee II on Vascular Findings. J Am Coll Radiol 2013; 10:789-794.  Low Dose CT Chest 02/12/2021 IMPRESSION: 1. Linear nodular opacities are seen in the left lower lobe, likely scarring related to prior infection. Lung-RADS 3S, probably benign findings. Short-term follow-up in 6 months is recommended with repeat low-dose chest CT without contrast (please use the following order, CT CHEST LCS NODULE FOLLOW-UP W/O CM). S modifier for anterior mediastinal mass and coronary artery calcifications. 2. 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 Recommend further evaluation with contrast-enhanced mediastinal mass protocol MRI. 3. Mild coronary artery calcifications of the LAD, recommend ASCVD risk assessment. 4. Aortic Atherosclerosis (ICD10-I70.0) and Emphysema (ICD10-J43.9).  CBC Latest Ref Rng & Units 11/15/2020  WBC 3.4 - 10.8 x10E3/uL 6.4  Hemoglobin 13.0 - 17.7 g/dL 13.9  Hematocrit 37.5 - 51.0 % 41.7  Platelets 150 - 450 x10E3/uL 244    BMP Latest Ref Rng & Units 02/19/2021 11/30/2020 11/15/2020  Glucose 70 - 99 mg/dL 95 87 81  BUN 8 - 27 mg/dL 12 15 13   Creatinine 0.76 - 1.27 mg/dL 1.63(H) 1.66(H) 1.71(H)  BUN/Creat Ratio 10 - 24 7(L) 9(L) 8(L)  Sodium 134 - 144 mmol/L 138 135 139  Potassium 3.5 - 5.2 mmol/L 4.6 4.8 4.9  Chloride 96 - 106 mmol/L 101 99 101  CO2 20 - 29 mmol/L 22 24 22  Calcium 8.6 - 10.2 mg/dL 9.7 9.7 10.0    BNP No results found for: BNP  ProBNP No results found for: PROBNP  PFT No results found for: FEV1PRE, FEV1POST, FVCPRE, FVCPOST, TLC, DLCOUNC, PREFEV1FVCRT, PSTFEV1FVCRT  MR CHEST W WO CONTRAST  Result  Date: 03/08/2021 CLINICAL DATA:  Mediastinal mass EXAM: MRI CHEST WITHOUT AND WITH CONTRAST TECHNIQUE: Multisequence, multiplanar MR examination of the chest was performed both prior to and following the uncomplicated intravenous administration of gadolinium contrast. CONTRAST:  83mL GADAVIST GADOBUTROL 1 MMOL/ML IV SOLN COMPARISON:  CT chest, 02/11/2021 FINDINGS: Cardiovascular: No significant vascular findings. Normal heart size. No pericardial effusion. Mediastinum/Nodes: Redemonstrated heterogeneous, lobulated solid mass in the anterior mediastinum with faint contrast enhancement, measuring 4.4 x 2.8 cm (series 7, image 18). Small internal areas of cystic change. No enlarged mediastinal, hilar, or axillary lymph nodes. Thyroid gland, trachea, and esophagus demonstrate no significant findings. Limited lungs/Pleura: Unremarkable. No pleural effusion or pneumothorax. Upper Abdomen: No acute abnormality. Musculoskeletal: No chest wall abnormality. No suspicious osseous lesions identified. IMPRESSION: 1. Redemonstrated heterogeneous, lobulated solid mass in the anterior mediastinum with faint contrast enhancement, measuring 4.4 x 2.8 cm. 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 Please see forthcoming, separately reported PET-CT examination for metabolic characterization, which may more accurately differentiate benign versus malignant thymic neoplasms. 2. No pathologically enlarged lymph nodes in the remainder of the chest. Electronically Signed   By: Delanna Ahmadi M.D.   On: 03/08/2021 11:44   NM PET Image Initial (PI) Skull Base To Thigh  Result Date: 03/09/2021 CLINICAL DATA:  Initial treatment strategy for anterior mediastinal mass on CT and MRI. EXAM: NUCLEAR MEDICINE PET SKULL BASE TO THIGH TECHNIQUE: 9.4 mCi F-18 FDG was injected intravenously. Full-ring PET imaging was performed from the skull base to thigh after the  radiotracer. CT data was obtained and used for attenuation correction and anatomic localization. Fasting blood glucose: 83 mg/dl COMPARISON:  Chest CT 02/11/2021.  MRI 03/06/2021. FINDINGS: Mediastinal blood pool activity: SUV max 2.2 Liver activity: SUV max NA NECK: Hypermetabolism anterior to the larynx could be muscular or related to an atypical location of a node in this region, including at 6 mm and a S.U.V. max of 3.9. The thyroid is diffusely hypermetabolic, without dominant mass. Example at a S.U.V. max of 9.4. Incidental CT findings: No cervical adenopathy. CHEST: The macrolobulated solid anterior mediastinal mass is hypermetabolic. Example at 3.6 x 2.6 cm and a S.U.V. max of 9.4 on 102/3. Similar in size to on the prior CT. No other thoracic hypermetabolism identified. Incidental CT findings: Aortic and lad coronary artery calcification. Mild centrilobular and paraseptal emphysema. ABDOMEN/PELVIS: No abdominopelvic parenchymal or nodal hypermetabolism. Incidental CT findings: Mild left adrenal thickening. Right adrenal calcification likely relates to remote hemorrhage or infection. No abdominopelvic adenopathy. Infrarenal abdominal aortic dilatation including at 3.7 x 3.1 cm on 184/3. No extension into the iliacs. Mild prostatomegaly. SKELETON: No abnormal marrow activity. Incidental CT findings: none IMPRESSION: 1. Moderate hypermetabolism corresponding to the anterior mediastinal mass. Differential considerations include thymic neoplasm or lymphoma. Recommend tissue sampling. 2. Diffuse thyroid hypermetabolism can be seen with thyroiditis. Consider correlation with thyroid function tests. 3. Muscular activity versus an atypically positioned mildly hypermetabolic node within the fat anterior to the larynx. Nonspecific. 4. Infrarenal abdominal aortic dilatation at maximally 3.6 cm. Recommend follow-up ultrasound every 2 years. This recommendation follows ACR consensus guidelines: White Paper of the ACR  Incidental Findings Committee II  on Vascular Findings. J Am Coll Radiol 2013; 10:789-794. 5. Aortic atherosclerosis (ICD10-I70.0), coronary artery atherosclerosis and emphysema (ICD10-J43.9). Electronically Signed   By: Abigail Miyamoto M.D.   On: 03/09/2021 15:42     Past medical hx Past Medical History:  Diagnosis Date   Anxiety      Social History   Tobacco Use   Smoking status: Every Day    Packs/day: 1.00    Years: 50.00    Pack years: 50.00    Types: Cigarettes   Smokeless tobacco: Never  Vaping Use   Vaping Use: Never used  Substance Use Topics   Alcohol use: Not Currently   Drug use: Not Currently    Mr.Cerone reports that he has been smoking cigarettes. He has a 50.00 pack-year smoking history. He has never used smokeless tobacco. He reports that he does not currently use alcohol. He reports that he does not currently use drugs.  Tobacco Cessation: Current Every day smoker  Past surgical hx, Family hx, Social hx all reviewed.  Current Outpatient Medications on File Prior to Visit  Medication Sig   levothyroxine (SYNTHROID) 25 MCG tablet Take 1 tablet (25 mcg total) by mouth daily.   No current facility-administered medications on file prior to visit.     No Known Allergies  Review Of Systems:  Constitutional:   No  weight loss, night sweats,  Fevers, chills, fatigue, or  lassitude.  HEENT:   No headaches,  Difficulty swallowing,  Tooth/dental problems, or  Sore throat,                No sneezing, itching, ear ache, nasal congestion, post nasal drip,   CV:  No chest pain,  Orthopnea, PND, swelling in lower extremities, anasarca, dizziness, palpitations, syncope.   GI  No heartburn, indigestion, abdominal pain, nausea, vomiting, diarrhea, change in bowel habits, loss of appetite, bloody stools.   Resp: No shortness of breath with exertion or at rest.  No excess mucus, no productive cough,  No non-productive cough,  No coughing up of blood.  No change in color of  mucus.  No wheezing.  No chest wall deformity  Skin: no rash or lesions.  GU: no dysuria, change in color of urine, no urgency or frequency.  No flank pain, no hematuria   MS:  No joint pain or swelling.  No decreased range of motion.  No back pain.  Psych:  No change in mood or affect. No depression or anxiety.  No memory loss.   Vital Signs BP 118/88 (BP Location: Left Arm, Cuff Size: Normal)    Pulse (!) 58    Temp 97.7 F (36.5 C) (Oral)    Ht 5\' 11"  (1.803 m)    Wt 176 lb 6.4 oz (80 kg)    SpO2 100%    BMI 24.60 kg/m    Physical Exam:  General- No distress,  A&Ox3, pleasant ENT: No sinus tenderness, TM clear, pale nasal mucosa, no oral exudate,no post nasal drip, no LAN Cardiac: S1, S2, regular rate and rhythm, no murmur Chest: + wheeze/ NO rales/ dullness; no accessory muscle use, no nasal flaring, no sternal retractions Abd.: Soft Non-tender, ND, BS +, Body mass index is 24.6 kg/m. Ext: No clubbing cyanosis, edema Neuro:  normal strength, MAE x 4, A&O x 3 Skin: No rashes, warm and dry, no lesions  Psych: normal mood and behavior   Assessment/Plan Incidental Finding of Anterior mediastinal mass on Lung Cancer Screening.  Hypermetabolic per PET Brother died  of thymic cancer  Plan Follow up with Dr. Kipp Brood today at 3 pm as you have planned. He will review your lab work with you and discuss plans for treatment, surgical option included. Please continue to work on quitting smoking as you have been doing.  Use the nicotine patches , gum or mints to help.  We will order PFT's , please schedule Please contact office for sooner follow up if symptoms do not improve or worsen or seek emergency care    Tobacco Abuse Smoking daily Working on cutting down Plan I have spent 3 minutes counseling patient on smoking cessation this visit. We have reviewed the risks of continued smoking on his current health situation. Patient verbalizes understanding of their  choice to continue  smoking and the negative health consequences including worsening of COPD, risk of lung cancer , stroke and heart disease..     I spent 45 minutes dedicated to the care of this patient on the date of this encounter to include pre-visit review of records, face-to-face time with the patient discussing conditions above, post visit ordering of testing, clinical documentation with the electronic health record, making appropriate referrals as documented, and communicating necessary information to the patient's healthcare team.    Magdalen Spatz, NP 03/22/2021  11:03 AM

## 2021-03-22 NOTE — Progress Notes (Signed)
°   °  [address]       [place], [address]             [phone]       Patient: Home Provider: Office Consent for Telemedicine visit obtained.  Todays visit was completed via a real-time telehealth (see specific modality noted below). The patient/authorized person provided oral consent at the time of the visit to engage in a telemedicine encounter with the present provider at [facility]. The patient/authorized person was informed of the potential benefits, limitations, and risks of telemedicine. The patient/authorized person expressed understanding that the laws that protect confidentiality also apply to telemedicine. The patient/authorized person acknowledged understanding that telemedicine does not provide emergency services and that he or she would need to call 911 or proceed to the nearest hospital for help if such a need arose.   Total time spent in the clinical discussion 10 minutes.  Telehealth Modality: Phone visit (audio only)  I had a telephone visit with Mr. Newsome.  He is undergoing evaluation for mediastinal mass.  His tumor markers have resulted, and his AFP is elevated to 6.3 which is slightly over the upper limit of normal.  His LDH was normal at 230, and his beta-hCG was normal as well.  With this it is possible that this may be a germ cell tumor.  I will review the findings with medical oncology.  I will touch base with this patient after the decisions been made.

## 2021-03-22 NOTE — Patient Instructions (Addendum)
It is good to see you today. I'm glad you are doing well. Follow up with Dr. Kipp Brood today at 3 pm as you have planned. He will review your lab work with you and discuss plans for treatment. Please continue to work on quitting smoking as you have been doing.  Use the nicotine patches , gum or mints to help.  We will order PFT's , please schedule Please contact office for sooner follow up if symptoms do not improve or worsen or seek emergency care

## 2021-03-29 ENCOUNTER — Telehealth: Payer: Medicare HMO | Admitting: Thoracic Surgery (Cardiothoracic Vascular Surgery)

## 2021-03-29 ENCOUNTER — Other Ambulatory Visit: Payer: Self-pay

## 2021-04-08 ENCOUNTER — Encounter (HOSPITAL_COMMUNITY): Payer: Self-pay | Admitting: Hematology

## 2021-04-08 ENCOUNTER — Inpatient Hospital Stay (HOSPITAL_COMMUNITY): Payer: Medicare HMO | Attending: Hematology | Admitting: Hematology

## 2021-04-08 ENCOUNTER — Inpatient Hospital Stay (HOSPITAL_COMMUNITY): Payer: Medicare HMO | Attending: Hematology

## 2021-04-08 ENCOUNTER — Other Ambulatory Visit: Payer: Self-pay

## 2021-04-08 DIAGNOSIS — F17210 Nicotine dependence, cigarettes, uncomplicated: Secondary | ICD-10-CM | POA: Diagnosis not present

## 2021-04-08 DIAGNOSIS — J9859 Other diseases of mediastinum, not elsewhere classified: Secondary | ICD-10-CM | POA: Insufficient documentation

## 2021-04-08 DIAGNOSIS — Z800 Family history of malignant neoplasm of digestive organs: Secondary | ICD-10-CM | POA: Insufficient documentation

## 2021-04-08 DIAGNOSIS — R69 Illness, unspecified: Secondary | ICD-10-CM | POA: Diagnosis not present

## 2021-04-08 DIAGNOSIS — Z808 Family history of malignant neoplasm of other organs or systems: Secondary | ICD-10-CM | POA: Insufficient documentation

## 2021-04-08 DIAGNOSIS — Z803 Family history of malignant neoplasm of breast: Secondary | ICD-10-CM | POA: Insufficient documentation
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
## 2021-04-08 NOTE — Patient Instructions (Addendum)
[place] at [facility] ?Discharge Instructions ? ?You were seen and examined today by Dr. Delton Coombes. Dr. Delton Coombes is a medical oncologist, meaning that he specializes in the treatment of cancer diagnoses. Dr. Delton Coombes discussed your past medical history, family history of cancers, and the events that led to you being here today. ? ?You were referred to Dr. Delton Coombes due to an abnormal scan, revealing a mass that could be concerning for cancer.  ? ?Dr. Delton Coombes has recommended a biopsy. Dr. Kipp Brood can do this, we will ask their office to arrange biopsy. This will determine exactly what is causing the mass. ? ?It is possible that this is a benign tumor or a type of cancer. ? ?Follow-up as scheduled. ? ? ?Thank you for choosing [place] at [facility] to provide your oncology and hematology care.  To afford each patient quality time with our provider, please arrive at least 15 minutes before your scheduled appointment time.  ? ?If you have a lab appointment with the [place] please come in thru the Main Entrance and check in at the main information desk. ? ?You need to re-schedule your appointment should you arrive 10 or more minutes late.  We strive to give you quality time with our providers, and arriving late affects you and other patients whose appointments are after yours.  Also, if you no show three or more times for appointments you may be dismissed from the clinic at the providers discretion.     ?Again, thank you for choosing [facility].  Our hope is that these requests will decrease the amount of time that you wait before being seen by our physicians.       ?_____________________________________________________________ ? ?Should you have questions after your visit to [facility], please contact our office at [phone] and follow the prompts.  Our office hours are 8:00 a.m. and 4:30 p.m. Monday - Friday.  Please  note that voicemails left after 4:00 p.m. may not be returned until the following business day.  We are closed weekends and major holidays.  You do have access to a nurse 24-7, just call the main number to the clinic [phone] and do not press any options, hold on the line and a nurse will answer the phone.   ? ?For prescription refill requests, have your pharmacy contact our office and allow 72 hours.   ? ?Due to Covid, you will need to wear a mask upon entering the hospital. If you do not have a mask, a mask will be given to you at the Main Entrance upon arrival. For doctor visits, patients may have 1 support person age 54 or older with them. For treatment visits, patients can not have anyone with them due to social distancing guidelines and our immunocompromised population.  ? ? ? ?

## 2021-04-09 ENCOUNTER — Encounter (HOSPITAL_COMMUNITY): Payer: Self-pay

## 2021-04-09 LAB — AFP TUMOR MARKER: AFP, Serum, Tumor Marker: 6.9 ng/mL (ref 0.0–8.4)

## 2021-04-09 NOTE — Progress Notes (Signed)
I met with the patient yesterday during and following initial visit with Dr. Delton Coombes. I provided my contact information and encouraged the patient to call with questions and concerns. ?

## 2021-04-15 LAB — MUSK ANTIBODY TEST
Interpretation: NEGATIVE
Technical Results: <1:10 {titer}

## 2021-04-15 LAB — MYASTHENIA GRAVIS PANEL 2 W/ RFLX MUSK ANTIBODY
A CHR BINDING ABS: <0.3 nmol/L
ACHR Blocking Abs: <15 % Inhibition (ref <15)
Acetylchol Modul Ab: 13 % Inhibition

## 2021-05-14 ENCOUNTER — Encounter (HOSPITAL_COMMUNITY): Payer: Self-pay

## 2021-05-14 NOTE — Progress Notes (Signed)
I have reached out to Elnita Maxwell at [facility] to request follow-up appt with Dr. Kipp Brood for this patient. I had to leave a detailed VM. Awaiting return call at this time.  ?

## 2021-06-07 ENCOUNTER — Ambulatory Visit: Payer: Medicare HMO | Admitting: Thoracic Surgery (Cardiothoracic Vascular Surgery)

## 2021-06-10 ENCOUNTER — Encounter: Payer: Self-pay | Admitting: Thoracic Surgery (Cardiothoracic Vascular Surgery)

## 2021-06-10 ENCOUNTER — Ambulatory Visit (INDEPENDENT_AMBULATORY_CARE_PROVIDER_SITE_OTHER): Payer: Medicare HMO | Admitting: Thoracic Surgery (Cardiothoracic Vascular Surgery)

## 2021-06-10 ENCOUNTER — Other Ambulatory Visit: Payer: Self-pay | Admitting: *Deleted

## 2021-06-10 ENCOUNTER — Encounter: Payer: Self-pay | Admitting: *Deleted

## 2021-06-10 VITALS — BP 111/73 | HR 68 | Resp 20 | Ht 71.0 in | Wt 174.6 lb

## 2021-06-10 DIAGNOSIS — J9859 Other diseases of mediastinum, not elsewhere classified: Secondary | ICD-10-CM

## 2021-06-11 NOTE — Progress Notes (Signed)
? ?   ?[address] ?      [name] ?            [phone]   ?                 ?Antonio Scott ?[place] Record #[id number] ?Date of Birth: May 23, 1955 ? ?Referring: Antonio Jack, MD ?Primary Care: Antonio Gouty, FNP ?Primary Cardiologist: None ? ?Chief Complaint:    ?Chief Complaint  ?Patient presents with  ? Mediastinal Mass  ?  F/u to further discuss surgery  ? ? ?History of Present Illness:    ?Antonio Scott 66 y.o. male with incidental finding for an anterior mediastinal mass.  He is being followed in the lung cancer screening program.  He does endorse some chest fullness and occasional sharp chest pain.  This is not associated with any activity or exertion.  He also admits to some visual defects on either eye.  This usually is relieved after rubbing it for few minutes.  He denies any other ocular symptoms or respiratory symptoms. ?  ?He informed me that his brother died of thymic cancer in Apr 21, 2017.  He also states that his father had an abnormal growth off of his aorta.  He is currently being treated with Synthroid for hypothyroidism. ? 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  This was repeated and was essentially normal. ?  ? ?Past Medical History:  ?Diagnosis Date  ? Anxiety   ? ? ?Past Surgical History:  ?Procedure Laterality Date  ? SKIN LESION EXCISION    ? ? ?Family History  ?Problem Relation Age of Onset  ? Stroke Mother   ? Diabetes Mother   ? Cancer Father   ? Cancer Brother   ? Liver cancer Maternal Aunt   ? Colon cancer Paternal Grandfather   ? ? ? ?Social History  ? ?Tobacco Use  ?Smoking Status Every Day  ? Packs/day: 1.00  ? Years: 50.00  ? Pack years: 50.00  ? Types: Cigarettes  ?Smokeless Tobacco Never  ?Tobacco Comments  ? Currently smoking 7-8 cig daily  ?  ?Social History  ? ?Substance and Sexual Activity  ?Alcohol  Use Not Currently  ? ? ? ?No Known Allergies ? ?Current Outpatient Medications  ?Medication Sig Dispense Refill  ? levothyroxine (SYNTHROID) 25 MCG tablet Take 1 tablet (25 mcg total) by mouth daily. 90 tablet 3  ? nicotine polacrilex (COMMIT) 2 MG lozenge Take 2 mg by mouth as needed for smoking cessation.    ? ?No current facility-administered medications for this visit.  ? ? ?Review of Systems  ?Constitutional: Negative.   ?Respiratory: Negative.    ?Cardiovascular: Negative.   ? ?PHYSICAL EXAMINATION: ?BP 111/73 (BP Location: Left Arm, Patient Position: Sitting, Cuff Size: Normal)   Pulse 68   Resp 20   Ht '5\' 11"'$  (1.803 m)   Wt 174 lb 9.6 oz (79.2 kg)   SpO2 97% Comment: RA  BMI 24.35 kg/m?  ? ?Physical Exam ?Constitutional:   ?   General: He is not in acute distress. ?   Appearance: Normal appearance. He is normal weight. He is not ill-appearing.  ?Cardiovascular:  ?   Rate and Rhythm: Normal rate.  ?Pulmonary:  ?   Effort: Pulmonary effort is normal.  ?Abdominal:  ?   General: Abdomen is flat.  ?Musculoskeletal:  ?  Cervical back: Normal range of motion.  ?Skin: ?   General: Skin is warm and dry.  ?Neurological:  ?   General: No focal deficit present.  ?   Mental Status: He is alert and oriented to person, place, and time.  ?  ? ?Diagnostic Studies & Laboratory data: ?   ? Recent Radiology Findings:  ? No results found. ? ? ?  ?I have independently reviewed the above radiology studies  and reviewed the findings with the patient.  ? ?Recent Lab Findings: ?Lab Results  ?Component Value Date  ? WBC 6.4 11/15/2020  ? HGB 13.9 11/15/2020  ? HCT 41.7 11/15/2020  ? PLT 244 11/15/2020  ? GLUCOSE 95 02/19/2021  ? CHOL 195 02/19/2021  ? TRIG 166 (H) 02/19/2021  ? HDL 59 02/19/2021  ? LDLCALC 107 (H) 02/19/2021  ? ALT 18 11/15/2020  ? AST 21 11/15/2020  ? NA 138 02/19/2021  ? K 4.6 02/19/2021  ? CL 101 02/19/2021  ? CREATININE 1.63 (H) 02/19/2021  ? BUN 12 02/19/2021  ? CO2 22 02/19/2021  ? TSH 8.60 (H) 03/18/2021   ? ? ? ? ? 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  His cross-sectional imaging and tumor markers have all been reviewed.  Recommended that he undergo an excisional biopsy.  This will consist of a right robotic assisted thoracoscopy with resection of anterior mediastinal mass.  The risk and benefits were discussed and he is agreeable to proceed. ?   ? ? ?Antonio Scott ?06/11/2021 2:00 PM ? ? ? ? ? ? ? ?

## 2021-06-11 NOTE — H&P (View-Only) (Signed)
? ?   ?[address] ?      [name] [address] ?            [phone]   ?                 ?O[place] Lansky ?[place] Record #[id number] ?Date of Birth: Aug 12, 1955 ? ?Referring: Derek Jack, MD ?Primary Care: Baruch Gouty, FNP ?Primary Cardiologist: None ? ?Chief Complaint:    ?Chief Complaint  ?Patient presents with  ? Mediastinal Mass  ?  F/u to further discuss surgery  ? ? ?History of Present Illness:    ?Antonio Scott 66 y.o. male with incidental finding for an anterior mediastinal mass.  He is being followed in the lung cancer screening program.  He does endorse some chest fullness and occasional sharp chest pain.  This is not associated with any activity or exertion.  He also admits to some visual defects on either eye.  This usually is relieved after rubbing it for few minutes.  He denies any other ocular symptoms or respiratory symptoms. ?  ?He informed me that his brother died of thymic cancer in 05/02/17.  He also states that his father had an abnormal growth off of his aorta.  He is currently being treated with Synthroid for hypothyroidism. ? 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  This was repeated and was essentially normal. ?  ? ?Past Medical History:  ?Diagnosis Date  ? Anxiety   ? ? ?Past Surgical History:  ?Procedure Laterality Date  ? SKIN LESION EXCISION    ? ? ?Family History  ?Problem Relation Age of Onset  ? Stroke Mother   ? Diabetes Mother   ? Cancer Father   ? Cancer Brother   ? Liver cancer Maternal Aunt   ? Colon cancer Paternal Grandfather   ? ? ? ?Social History  ? ?Tobacco Use  ?Smoking Status Every Day  ? Packs/day: 1.00  ? Years: 50.00  ? Pack years: 50.00  ? Types: Cigarettes  ?Smokeless Tobacco Never  ?Tobacco Comments  ? Currently smoking 7-8 cig daily  ?  ?Social History  ? ?Substance and Sexual Activity  ?Alcohol  Use Not Currently  ? ? ? ?No Known Allergies ? ?Current Outpatient Medications  ?Medication Sig Dispense Refill  ? levothyroxine (SYNTHROID) 25 MCG tablet Take 1 tablet (25 mcg total) by mouth daily. 90 tablet 3  ? nicotine polacrilex (COMMIT) 2 MG lozenge Take 2 mg by mouth as needed for smoking cessation.    ? ?No current facility-administered medications for this visit.  ? ? ?Review of Systems  ?Constitutional: Negative.   ?Respiratory: Negative.    ?Cardiovascular: Negative.   ? ?PHYSICAL EXAMINATION: ?BP 111/73 (BP Location: Left Arm, Patient Position: Sitting, Cuff Size: Normal)   Pulse 68   Resp 20   Ht '5\' 11"'$  (1.803 m)   Wt 174 lb 9.6 oz (79.2 kg)   SpO2 97% Comment: RA  BMI 24.35 kg/m?  ? ?Physical Exam ?Constitutional:   ?   General: He is not in acute distress. ?   Appearance: Normal appearance. He is normal weight. He is not ill-appearing.  ?Cardiovascular:  ?   Rate and Rhythm: Normal rate.  ?Pulmonary:  ?   Effort: Pulmonary effort is normal.  ?Abdominal:  ?   General: Abdomen is flat.  ?Musculoskeletal:  ?  Cervical back: Normal range of motion.  ?Skin: ?   General: Skin is warm and dry.  ?Neurological:  ?   General: No focal deficit present.  ?   Mental Status: He is alert and oriented to person, place, and time.  ?  ? ?Diagnostic Studies & Laboratory data: ?   ? Recent Radiology Findings:  ? No results found. ? ? ?  ?I have independently reviewed the above radiology studies  and reviewed the findings with the patient.  ? ?Recent Lab Findings: ?Lab Results  ?Component Value Date  ? WBC 6.4 11/15/2020  ? HGB 13.9 11/15/2020  ? HCT 41.7 11/15/2020  ? PLT 244 11/15/2020  ? GLUCOSE 95 02/19/2021  ? CHOL 195 02/19/2021  ? TRIG 166 (H) 02/19/2021  ? HDL 59 02/19/2021  ? LDLCALC 107 (H) 02/19/2021  ? ALT 18 11/15/2020  ? AST 21 11/15/2020  ? NA 138 02/19/2021  ? K 4.6 02/19/2021  ? CL 101 02/19/2021  ? CREATININE 1.63 (H) 02/19/2021  ? BUN 12 02/19/2021  ? CO2 22 02/19/2021  ? TSH 8.60 (H) 03/18/2021   ? ? ? ? ? 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  His cross-sectional imaging and tumor markers have all been reviewed.  Recommended that he undergo an excisional biopsy.  This will consist of a right robotic assisted thoracoscopy with resection of anterior mediastinal mass.  The risk and benefits were discussed and he is agreeable to proceed. ?   ? ? ?Lucile Crater Shaleen Talamantez ?06/11/2021 2:00 PM ? ? ? ? ? ? ? ?

## 2021-06-13 NOTE — Pre-Procedure Instructions (Signed)
Surgical Instructions ? ? ? Your procedure is scheduled on Monday, May 15. ? Report to [facility] Main Entrance "A" at 5:30 A.M., then check in with the Admitting office. ? Call this number if you have problems the morning of surgery: ? [phone] ? ? If you have any questions prior to your surgery date call [phone]: Open Monday-Friday 8am-4pm ? ? ? Remember: ? Do not eat or drink after midnight the night before your surgery ? ?  ? Take these medicines the morning of surgery with A SIP OF WATER:  ?levothyroxine (SYNTHROID) ? ? ?As of today, STOP taking any Aspirin (unless otherwise instructed by your surgeon) Aleve, Naproxen, Ibuprofen, Motrin, Advil, Goody's, BC's, all herbal medications, fish oil, and all vitamins. ? ?         ?Do not wear jewelry or makeup ?Do not wear lotions, powders, perfumes/colognes, or deodorant. ?Do not shave 48 hours prior to surgery.  Men may shave face and neck. ?Do not bring valuables to the hospital. ?Do not wear nail polish, gel polish, artificial nails, or any other type of covering on natural nails (fingers and toes) ?If you have artificial nails or gel coating that need to be removed by a nail salon, please have this removed prior to surgery. Artificial nails or gel coating may interfere with anesthesia's ability to adequately monitor your vital signs. ? ?[facility] is not responsible for any belongings or valuables. .  ? ?Do NOT Smoke (Tobacco/Vaping)  24 hours prior to your procedure ? ?If you use a CPAP at night, you may bring your mask for your overnight stay. ?  ?Contacts, glasses, hearing aids, dentures or partials may not be worn into surgery, please bring cases for these belongings ?  ?For patients admitted to the hospital, discharge time will be determined by your treatment team. ?  ?Patients discharged the day of surgery will not be allowed to drive home, and someone needs to stay with them for 24 hours. ? ? ?SURGICAL WAITING ROOM VISITATION ?Patients having  surgery or a procedure in a hospital may have two support people. ?Children under the age of 47 must have an adult with them who is not the patient. ?They may stay in the waiting area during the procedure and may switch out with other visitors. If the patient needs to stay at the hospital during part of their recovery, the visitor guidelines for inpatient rooms apply. ? ?Please refer to the [facility] website for the visitor guidelines for Inpatients (after your surgery is over and you are in a regular room).  ? ? ? ? ? ?Special instructions:   ? ?Oral Hygiene is also important to reduce your risk of infection.  Remember - BRUSH YOUR TEETH THE MORNING OF SURGERY WITH YOUR REGULAR TOOTHPASTE ? ? ?[facility]- Preparing For Surgery ? ?Before surgery, you can play an important role. Because skin is not sterile, your skin needs to be as free of germs as possible. You can reduce the number of germs on your skin by washing with CHG (chlorahexidine gluconate) Soap before surgery.  CHG is an antiseptic cleaner which kills germs and bonds with the skin to continue killing germs even after washing.   ? ? ?Please do not use if you have an allergy to CHG or antibacterial soaps. If your skin becomes reddened/irritated stop using the CHG.  ?Do not shave (including legs and underarms) for at least 48 hours prior to first CHG shower. It is OK to shave your  face. ? ?Please follow these instructions carefully. ?  ? ? Shower the NIGHT BEFORE SURGERY and the MORNING OF SURGERY with CHG Soap.  ? If you chose to wash your hair, wash your hair first as usual with your normal shampoo. After you shampoo, rinse your hair and body thoroughly to remove the shampoo.  Then [employer] and genitals (private parts) with your normal soap and rinse thoroughly to remove soap. ? ?After that Use CHG Soap as you would any other liquid soap. You can apply CHG directly to the skin and wash gently with a scrungie or a clean washcloth.  ? ?Apply the CHG Soap  to your body ONLY FROM THE NECK DOWN.  Do not use on open wounds or open sores. Avoid contact with your eyes, ears, mouth and genitals (private parts). Wash Face and genitals (private parts)  with your normal soap.  ? ?Wash thoroughly, paying special attention to the area where your surgery will be performed. ? ?Thoroughly rinse your body with warm water from the neck down. ? ?DO NOT shower/wash with your normal soap after using and rinsing off the CHG Soap. ? ?Pat yourself dry with a CLEAN TOWEL. ? ?Wear CLEAN PAJAMAS to bed the night before surgery ? ?Place CLEAN SHEETS on your bed the night before your surgery ? ?DO NOT SLEEP WITH PETS. ? ? ?Day of Surgery: ? ?Take a shower with CHG soap. ?Wear Clean/Comfortable clothing the morning of surgery ?Do not apply any deodorants/lotions.   ?Remember to brush your teeth WITH YOUR REGULAR TOOTHPASTE. ? ? ? ?If you received a COVID test during your pre-op visit, it is requested that you wear a mask when out in public, stay away from anyone that may not be feeling well, and notify your surgeon if you develop symptoms. If you have been in contact with anyone that has tested positive in the last 10 days, please notify your surgeon. ? ?  ?Please read over the following fact sheets that you were given.  ? ?

## 2021-06-14 ENCOUNTER — Encounter (HOSPITAL_COMMUNITY)
Admission: RE | Admit: 2021-06-14 | Discharge: 2021-06-14 | Disposition: A | Discharge status: Home / Self Care | Payer: Medicare HMO | Source: Ambulatory Visit | Attending: Thoracic Surgery (Cardiothoracic Vascular Surgery) | Admitting: Thoracic Surgery (Cardiothoracic Vascular Surgery)

## 2021-06-14 ENCOUNTER — Other Ambulatory Visit: Payer: Self-pay

## 2021-06-14 ENCOUNTER — Encounter (HOSPITAL_COMMUNITY): Payer: Self-pay

## 2021-06-14 VITALS — BP 115/81 | HR 69 | Temp 98.4°F | Resp 18 | Ht 71.0 in | Wt 174.2 lb

## 2021-06-14 DIAGNOSIS — E039 Hypothyroidism, unspecified: Secondary | ICD-10-CM | POA: Insufficient documentation

## 2021-06-14 DIAGNOSIS — J9859 Other diseases of mediastinum, not elsewhere classified: Secondary | ICD-10-CM

## 2021-06-14 DIAGNOSIS — J439 Emphysema, unspecified: Secondary | ICD-10-CM | POA: Insufficient documentation

## 2021-06-14 DIAGNOSIS — E328 Other diseases of thymus: Secondary | ICD-10-CM | POA: Insufficient documentation

## 2021-06-14 DIAGNOSIS — I2510 Atherosclerotic heart disease of native coronary artery without angina pectoris: Secondary | ICD-10-CM | POA: Insufficient documentation

## 2021-06-14 DIAGNOSIS — F17210 Nicotine dependence, cigarettes, uncomplicated: Secondary | ICD-10-CM | POA: Insufficient documentation

## 2021-06-14 DIAGNOSIS — F419 Anxiety disorder, unspecified: Secondary | ICD-10-CM | POA: Diagnosis not present

## 2021-06-14 DIAGNOSIS — D72829 Elevated white blood cell count, unspecified: Secondary | ICD-10-CM | POA: Diagnosis not present

## 2021-06-14 DIAGNOSIS — Z01818 Encounter for other preprocedural examination: Secondary | ICD-10-CM

## 2021-06-14 DIAGNOSIS — R69 Illness, unspecified: Secondary | ICD-10-CM | POA: Diagnosis not present

## 2021-06-14 DIAGNOSIS — C37 Malignant neoplasm of thymus: Secondary | ICD-10-CM | POA: Diagnosis not present

## 2021-06-14 DIAGNOSIS — D6959 Other secondary thrombocytopenia: Secondary | ICD-10-CM | POA: Diagnosis not present

## 2021-06-14 DIAGNOSIS — Z808 Family history of malignant neoplasm of other organs or systems: Secondary | ICD-10-CM | POA: Insufficient documentation

## 2021-06-14 DIAGNOSIS — Z802 Family history of malignant neoplasm of other respiratory and intrathoracic organs: Secondary | ICD-10-CM | POA: Diagnosis not present

## 2021-06-14 DIAGNOSIS — Z20822 Contact with and (suspected) exposure to covid-19: Secondary | ICD-10-CM | POA: Insufficient documentation

## 2021-06-14 DIAGNOSIS — I700 Atherosclerosis of aorta: Secondary | ICD-10-CM | POA: Insufficient documentation

## 2021-06-14 DIAGNOSIS — R222 Localized swelling, mass and lump, trunk: Secondary | ICD-10-CM | POA: Insufficient documentation

## 2021-06-14 HISTORY — DX: Hypothyroidism, unspecified: E03.9

## 2021-06-14 LAB — CBC
HCT: 43 % (ref 39.0–52.0)
Hemoglobin: 14.4 g/dL (ref 13.0–17.0)
MCH: 28.9 pg (ref 26.0–34.0)
MCHC: 33.5 g/dL (ref 30.0–36.0)
MCV: 86.3 fL (ref 80.0–100.0)
Platelets: 167 10*3/uL (ref 150–400)
RBC: 4.98 MIL/uL (ref 4.22–5.81)
RDW: 14.5 % (ref 11.5–15.5)
WBC: 5.8 10*3/uL (ref 4.0–10.5)
nRBC: 0 % (ref 0.0–0.2)

## 2021-06-14 LAB — TYPE AND SCREEN
ABO/RH(D): O POS
Antibody Screen: NEGATIVE

## 2021-06-14 LAB — COMPREHENSIVE METABOLIC PANEL
ALT: 22 U/L (ref 0–44)
AST: 29 U/L (ref 15–41)
Albumin: 4 g/dL (ref 3.5–5.0)
Alkaline Phosphatase: 70 U/L (ref 38–126)
Anion gap: 7 (ref 5–15)
BUN: 16 mg/dL (ref 8–23)
CO2: 22 mmol/L (ref 22–32)
Calcium: 9.2 mg/dL (ref 8.9–10.3)
Chloride: 108 mmol/L (ref 98–111)
Creatinine, Ser: 1.74 mg/dL — ABNORMAL HIGH (ref 0.61–1.24)
GFR, Estimated: 43 mL/min — ABNORMAL LOW (ref >60)
Glucose, Bld: 100 mg/dL — ABNORMAL HIGH (ref 70–99)
Potassium: 5.1 mmol/L (ref 3.5–5.1)
Sodium: 137 mmol/L (ref 135–145)
Total Bilirubin: 0.6 mg/dL (ref 0.3–1.2)
Total Protein: 6.9 g/dL (ref 6.5–8.1)

## 2021-06-14 LAB — BLOOD GAS, ARTERIAL
Acid-Base Excess: 2 mmol/L (ref 0.0–2.0)
Bicarbonate: 26.5 mmol/L (ref 20.0–28.0)
Drawn by: 164
O2 Saturation: 99.3 %
Patient temperature: 37
pCO2 arterial: 40 mmHg (ref 32–48)
pH, Arterial: 7.43 (ref 7.35–7.45)
pO2, Arterial: 121 mmHg — ABNORMAL HIGH (ref 83–108)

## 2021-06-14 LAB — SURGICAL PCR SCREEN
MRSA, PCR: NEGATIVE
Staphylococcus aureus: NEGATIVE

## 2021-06-14 LAB — SARS CORONAVIRUS 2 (TAT 6-24 HRS): SARS Coronavirus 2: NEGATIVE

## 2021-06-14 LAB — PROTIME-INR
INR: 1 (ref 0.8–1.2)
Prothrombin Time: 13 seconds (ref 11.4–15.2)

## 2021-06-14 LAB — APTT: aPTT: 24 seconds (ref 24–36)

## 2021-06-14 NOTE — Progress Notes (Addendum)
PCP - Darla Lesches, FNP ?Cardiologist - Denies ? ?PPM/ICD - Denies ?Device Orders -  ?Rep Notified -  ? ?Chest x-ray - 06/14/21 ?EKG - 06/14/21 ?Stress Test - Denies ?ECHO - Denies ?Cardiac Cath - Denies ? ?Sleep Study - Denies ? ?DM - denies ? ?Blood Thinner Instructions: Denies ? ?COVID TEST- 06/14/21 ? ? ?Anesthesia review: Yes abnormal EKG ? ? ?Patient denies shortness of breath, fever, cough and chest pain at PAT appointment ? ? ?All instructions explained to the patient, with a verbal understanding of the material. Patient agrees to go over the instructions while at home for a better understanding. Patient also instructed to wear a mask while in public after being tested for COVID-19. The opportunity to ask questions was provided. ? ? ?

## 2021-06-14 NOTE — Progress Notes (Signed)
Anesthesia Chart Review: ? Case: [id number] Date/Time: 06/17/21 0715  ? Procedure: XI ROBOTIC ASSISTED RESECTION OF MEDIASTINAL MASS (Right)  ? Anesthesia type: General  ? Pre-op diagnosis: MEDIASTINAL MASS  ? Location: [facility] ROOM 10 / [facility]  ? Surgeons: Lajuana Matte, MD  ? ?  ? ? ?DISCUSSION: Patient is a 66 year old male scheduled for the above procedure. ? ?History includes smoking, hypothyroidism, anxiety. Review of labs suggests underlying CKD, as Creatinine ~ 1.6-1.7 range, eGFR 43-46 since 11/2020. ? ?He had a chest CT as part of lung cancer screening given his smoking history. 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 He also had mild LAD calcifications, aortic atherosclerosis, and emphysema.   ? ?He was referred to CT surgery and ONC. His brother passed away from thymic cancer in 2019.  MRI imagings felt most consistent with thymic carcinoma.  Thymic markers showed "mild elevation in his AFP.  This was repeated and was essentially normal." ? ?EKG from 06/14/21 showed SB at 57 bpm, possible lateral and inferior infarct (age undetermined). There are no comparison tracings. Per ONC and CT surgery notes, he is able to do typical home activities as well as yard work without exertional symptoms.  He is retired Engineer, structural.  He been smoking 1 pack of cigarettes per day for 50 years but was down to a couple cigarettes/day in March.  ? ?Communicated with TCTS RN Clover regarding having Dr. Kipp Brood review EKG. He felt patient was "good to proceed." Reviewed with anesthesiologist Suann Larry, MD.  Anesthesia team to evaluate on the day of surgery.  06/14/21 CXR in process.  He also needs UA on the day of surgery per surgeon orders. ? ? ?VS: BP 115/81   Pulse 69   Temp 36.9 ?C (Oral)   Resp 18    Ht _0  (1.803 m)   Wt 79 kg   SpO2 98%   BMI 24.30 kg/m?  ? ? ?PROVIDERS: ?Baruch Gouty, FNP is PCP, established since 11/15/20.  ?Derek Jack, MD is HEM-ONC ? ? ?LABS: Labs reviewed: Acceptable for surgery. Overall, Creatinine stable since 11/2020.  ?(all labs ordered are listed, but only abnormal results are displayed) ? ?Labs Reviewed  ?COMPREHENSIVE METABOLIC PANEL - Abnormal; Notable for the following components:  ?    Result Value  ? Glucose, Bld 100 (*)   ? Creatinine, Ser 1.74 (*)   ? GFR, Estimated 43 (*)   ? All other components within normal limits  ?BLOOD GAS, ARTERIAL - Abnormal; Notable for the following components:  ? pO2, Arterial 121 (*)   ? All other components within normal limits  ?SURGICAL PCR SCREEN  ?SARS CORONAVIRUS 2 (TAT 6-24 HRS)  ?CBC  ?PROTIME-INR  ?APTT  ?TYPE AND SCREEN  ? ? ?IMAGES: ?CXR 06/14/21: In process. ? ?PET Scan 03/07/21: ?IMPRESSION: ?1. Moderate hypermetabolism corresponding to the anterior ?mediastinal mass. Differential considerations include thymic ?neoplasm or lymphoma. Recommend tissue sampling. ?2. Diffuse thyroid hypermetabolism can be seen with thyroiditis. ?Consider correlation with thyroid function tests. ?3. Muscular activity versus an atypically positioned mildly ?hypermetabolic node within the fat anterior to the larynx. ?Nonspecific. ?4. Infrarenal abdominal aortic dilatation at maximally 3.6 cm. ?Recommend follow-up ultrasound every 2 years. This recommendation ?follows ACR consensus guidelines: White Paper of the ACR Incidental ?Findings Committee II on Vascular Findings. J  Am Coll Radiol 2013; ?26:948-546. ?5. Aortic atherosclerosis (ICD10-I70.0), coronary artery ?atherosclerosis and emphysema (ICD10-J43.9). ?  ?MRI Chest 03/06/21: ?IMPRESSION: ?1. Redemonstrated heterogeneous, lobulated solid mass in the ?anterior mediastinum with faint contrast enhancement, measuring 4.4 ?x 2.8 cm. Findings are consistent with thymic epithelial  neoplasm, ?and worrisome for invasive thymoma or thymic carcinoma given size. ?Please see forthcoming, separately reported PET-CT examination for ?metabolic characterization, which may more accurately differentiate ?benign versus malignant thymic neoplasms. ?2. No pathologically enlarged lymph nodes in the remainder of the ?chest. ? ?CT Chest 02/11/21: ?IMPRESSION: ?1. Linear nodular opacities are seen in the left lower lobe, likely ?scarring related to prior infection. Lung-RADS 3S, probably benign ?findings. Short-term follow-up in 6 months is recommended with ?repeat low-dose chest CT without contrast (please use the following ?order, CT CHEST LCS NODULE FOLLOW-UP W/O CM). S modifier for ?anterior mediastinal mass and coronary artery calcifications. ?2. Lobulated anterior mediastinal mass measuring up to 5.4 cm in ?craniocaudal dimension, concerning for thymic epithelial neoplasm. ?Recommend further evaluation with contrast-enhanced mediastinal mass ?protocol MRI. ?3. Mild coronary artery calcifications of the LAD, recommend ASCVD ?risk assessment. ?4. Aortic Atherosclerosis (ICD10-I70.0) and Emphysema (ICD10-J43.9). ? ? ?EKG: 06/14/21: ?Sinus bradycardia ?Possible Lateral infarct , age undetermined ?Inferior infarct , age undetermined ?Abnormal ECG ?No previous ECGs available ? ? ?CV: N/A ? ?Past Medical History:  ?Diagnosis Date  ? Anxiety   ? Hypothyroidism   ? ? ?Past Surgical History:  ?Procedure Laterality Date  ? SKIN LESION EXCISION    ? ? ?MEDICATIONS: ? levothyroxine (SYNTHROID) 25 MCG tablet  ? neomycin-bacitracin-polymyxin (NEOSPORIN) ointment  ? nicotine polacrilex (COMMIT) 2 MG lozenge  ? ?No current facility-administered medications for this encounter.  ? ? ?Myra Gianotti, PA-C ?Surgical Short Stay/Anesthesiology ?[facility] Phone [phone] ?[facility] Phone [phone] ?06/14/2021 4:35 PM ? ? ? ? ? ? ?

## 2021-06-14 NOTE — Anesthesia Preprocedure Evaluation (Addendum)
Anesthesia Evaluation  ?Patient identified by MRN, date of birth, ID band ?Patient awake ? ? ? ?Reviewed: ?Allergy & Precautions, NPO status , Patient's Chart, lab work & pertinent test results ? ?Airway ?Mallampati: III ? ?TM Distance: >3 FB ?Neck ROM: Full ? ? ? Dental ? ?(+) Dental Advisory Given, Edentulous Upper, Edentulous Lower ?  ?Pulmonary ?Current Smoker and Patient abstained from smoking.,  ?MEDIASTINAL MASS ?  ?Pulmonary exam normal ?breath sounds clear to auscultation ? ? ? ? ? ? Cardiovascular ?Exercise Tolerance: Good ?negative cardio ROS ?Normal cardiovascular exam ?Rhythm:Regular Rate:Normal ? ? ?  ?Neuro/Psych ?PSYCHIATRIC DISORDERS Anxiety negative neurological ROS ?   ? GI/Hepatic ?negative GI ROS, Neg liver ROS,   ?Endo/Other  ?Hypothyroidism  ? Renal/GU ?Renal InsufficiencyRenal disease  ? ?  ?Musculoskeletal ?negative musculoskeletal ROS ?(+)  ? Abdominal ?  ?Peds ? Hematology ?negative hematology ROS ?(+)   ?Anesthesia Other Findings ? ? Reproductive/Obstetrics ? ?  ? ? ? ? ? ? ? ? ? ? ? ? ? ?  ?  ? ? ? ? ? ? ?Anesthesia Physical ?Anesthesia Plan ? ?ASA: 3 ? ?Anesthesia Plan: General  ? ?Post-op Pain Management: Tylenol PO (pre-op)*  ? ?Induction: Intravenous ? ?PONV Risk Score and Plan: 2 and Midazolam, Dexamethasone and Ondansetron ? ?Airway Management Planned: Double Lumen EBT ? ?Additional Equipment: ClearSight ? ?Intra-op Plan:  ? ?Post-operative Plan: Extubation in OR ? ?Informed Consent: I have reviewed the patients History and Physical, chart, labs and discussed the procedure including the risks, benefits and alternatives for the proposed anesthesia with the patient or authorized representative who has indicated his/her understanding and acceptance.  ? ? ? ?Dental advisory given ? ?Plan Discussed with: CRNA ? ?Anesthesia Plan Comments: (PAT note written 06/14/2021 by Myra Gianotti, PA-C. ? ?2 large bore PIV)  ? ? ? ?Anesthesia Quick Evaluation ? ?

## 2021-06-14 NOTE — Progress Notes (Signed)
Patient's EKG abnormal. Reached out to his PCP's office to compare any prior EKG's. Spoke to Florence at [facility] office who confirmed there are no prior EKG's to compare to.  ? ?Missed UA collection at PAT appt need to collect on day of surgery.  ?

## 2021-06-17 ENCOUNTER — Encounter (HOSPITAL_COMMUNITY)
Admission: RE | Disposition: A | Discharge status: Home / Self Care | Payer: Self-pay | Source: Home / Self Care | Attending: Thoracic Surgery (Cardiothoracic Vascular Surgery)

## 2021-06-17 ENCOUNTER — Inpatient Hospital Stay (HOSPITAL_COMMUNITY): Payer: Medicare HMO

## 2021-06-17 ENCOUNTER — Inpatient Hospital Stay (HOSPITAL_COMMUNITY): Payer: Medicare HMO | Admitting: Vascular Surgery

## 2021-06-17 ENCOUNTER — Other Ambulatory Visit: Payer: Self-pay

## 2021-06-17 ENCOUNTER — Inpatient Hospital Stay (HOSPITAL_COMMUNITY)
Admission: RE | Admit: 2021-06-17 | Discharge: 2021-06-19 | DRG: 828 | Disposition: A | Discharge status: Home / Self Care | Payer: Medicare HMO | Attending: Thoracic Surgery (Cardiothoracic Vascular Surgery) | Admitting: Thoracic Surgery (Cardiothoracic Vascular Surgery)

## 2021-06-17 ENCOUNTER — Inpatient Hospital Stay (HOSPITAL_COMMUNITY): Payer: Medicare HMO | Admitting: Anesthesiology

## 2021-06-17 ENCOUNTER — Encounter (HOSPITAL_COMMUNITY): Payer: Self-pay | Admitting: Thoracic Surgery (Cardiothoracic Vascular Surgery)

## 2021-06-17 DIAGNOSIS — E328 Other diseases of thymus: Secondary | ICD-10-CM | POA: Diagnosis present

## 2021-06-17 DIAGNOSIS — F419 Anxiety disorder, unspecified: Secondary | ICD-10-CM

## 2021-06-17 DIAGNOSIS — J9859 Other diseases of mediastinum, not elsewhere classified: Principal | ICD-10-CM | POA: Diagnosis present

## 2021-06-17 DIAGNOSIS — J439 Emphysema, unspecified: Secondary | ICD-10-CM | POA: Diagnosis not present

## 2021-06-17 DIAGNOSIS — C37 Malignant neoplasm of thymus: Principal | ICD-10-CM | POA: Diagnosis present

## 2021-06-17 DIAGNOSIS — Z634 Disappearance and death of family member: Secondary | ICD-10-CM

## 2021-06-17 DIAGNOSIS — C7989 Secondary malignant neoplasm of other specified sites: Secondary | ICD-10-CM | POA: Diagnosis not present

## 2021-06-17 DIAGNOSIS — I700 Atherosclerosis of aorta: Secondary | ICD-10-CM | POA: Diagnosis present

## 2021-06-17 DIAGNOSIS — I2510 Atherosclerotic heart disease of native coronary artery without angina pectoris: Secondary | ICD-10-CM | POA: Diagnosis not present

## 2021-06-17 DIAGNOSIS — F17210 Nicotine dependence, cigarettes, uncomplicated: Secondary | ICD-10-CM | POA: Diagnosis not present

## 2021-06-17 DIAGNOSIS — Z79890 Hormone replacement therapy (postmenopausal): Secondary | ICD-10-CM | POA: Diagnosis not present

## 2021-06-17 DIAGNOSIS — D72829 Elevated white blood cell count, unspecified: Secondary | ICD-10-CM | POA: Diagnosis not present

## 2021-06-17 DIAGNOSIS — Z20822 Contact with and (suspected) exposure to covid-19: Secondary | ICD-10-CM | POA: Diagnosis not present

## 2021-06-17 DIAGNOSIS — R222 Localized swelling, mass and lump, trunk: Secondary | ICD-10-CM | POA: Diagnosis not present

## 2021-06-17 DIAGNOSIS — N289 Disorder of kidney and ureter, unspecified: Secondary | ICD-10-CM

## 2021-06-17 DIAGNOSIS — D6959 Other secondary thrombocytopenia: Secondary | ICD-10-CM | POA: Diagnosis not present

## 2021-06-17 DIAGNOSIS — R079 Chest pain, unspecified: Secondary | ICD-10-CM | POA: Diagnosis not present

## 2021-06-17 DIAGNOSIS — R918 Other nonspecific abnormal finding of lung field: Secondary | ICD-10-CM | POA: Diagnosis not present

## 2021-06-17 DIAGNOSIS — E039 Hypothyroidism, unspecified: Secondary | ICD-10-CM | POA: Diagnosis not present

## 2021-06-17 DIAGNOSIS — R69 Illness, unspecified: Secondary | ICD-10-CM | POA: Diagnosis not present

## 2021-06-17 DIAGNOSIS — Z802 Family history of malignant neoplasm of other respiratory and intrathoracic organs: Secondary | ICD-10-CM | POA: Diagnosis not present

## 2021-06-17 DIAGNOSIS — Z8709 Personal history of other diseases of the respiratory system: Secondary | ICD-10-CM | POA: Diagnosis not present

## 2021-06-17 DIAGNOSIS — R0602 Shortness of breath: Secondary | ICD-10-CM | POA: Diagnosis not present

## 2021-06-17 HISTORY — PX: INTERCOSTAL NERVE BLOCK: SHX5021

## 2021-06-17 LAB — URINALYSIS, ROUTINE W REFLEX MICROSCOPIC
Bilirubin Urine: NEGATIVE
Glucose, UA: NEGATIVE mg/dL
Hgb urine dipstick: NEGATIVE
Ketones, ur: NEGATIVE mg/dL
Leukocytes,Ua: NEGATIVE
Nitrite: NEGATIVE
Protein, ur: NEGATIVE mg/dL
Specific Gravity, Urine: 1.01 (ref 1.005–1.030)
pH: 5 (ref 5.0–8.0)

## 2021-06-17 LAB — ABO/RH: ABO/RH(D): O POS

## 2021-06-17 SURGERY — EXCISION, MASS, MEDIASTINUM, ROBOT-ASSISTED
Anesthesia: General | Site: Chest | Laterality: Right

## 2021-06-17 MED ORDER — ALBUTEROL SULFATE (2.5 MG/3ML) 0.083% IN NEBU
2.5000 mg | INHALATION_SOLUTION | RESPIRATORY_TRACT | Status: DC
  Administered 2021-06-17: 2.5 mg via RESPIRATORY_TRACT
  Filled 2021-06-17: qty 3

## 2021-06-17 MED ORDER — FENTANYL CITRATE (PF) 100 MCG/2ML IJ SOLN
25.0000 ug | INTRAMUSCULAR | Status: DC | PRN
  Administered 2021-06-17 (×2): 50 ug via INTRAVENOUS

## 2021-06-17 MED ORDER — ONDANSETRON HCL 4 MG/2ML IJ SOLN
INTRAMUSCULAR | Status: AC
  Filled 2021-06-17: qty 2

## 2021-06-17 MED ORDER — PHENYLEPHRINE 80 MCG/ML (10ML) SYRINGE FOR IV PUSH (FOR BLOOD PRESSURE SUPPORT)
PREFILLED_SYRINGE | INTRAVENOUS | Status: DC | PRN
Start: 2021-06-17 — End: 2021-06-17
  Administered 2021-06-17: 160 ug via INTRAVENOUS

## 2021-06-17 MED ORDER — FENTANYL CITRATE (PF) 250 MCG/5ML IJ SOLN
INTRAMUSCULAR | Status: DC | PRN
  Administered 2021-06-17 (×2): 50 ug via INTRAVENOUS
  Administered 2021-06-17: 100 ug via INTRAVENOUS
  Administered 2021-06-17: 50 ug via INTRAVENOUS

## 2021-06-17 MED ORDER — BISACODYL 5 MG PO TBEC
10.0000 mg | DELAYED_RELEASE_TABLET | Freq: Every day | ORAL | Status: DC
  Administered 2021-06-17 – 2021-06-18 (×2): 10 mg via ORAL
  Filled 2021-06-17 (×2): qty 2

## 2021-06-17 MED ORDER — MIDAZOLAM HCL 2 MG/2ML IJ SOLN
INTRAMUSCULAR | Status: DC | PRN
  Administered 2021-06-17: 2 mg via INTRAVENOUS

## 2021-06-17 MED ORDER — DEXAMETHASONE SODIUM PHOSPHATE 10 MG/ML IJ SOLN
INTRAMUSCULAR | Status: AC
  Filled 2021-06-17: qty 1

## 2021-06-17 MED ORDER — ENOXAPARIN SODIUM 40 MG/0.4ML IJ SOSY
40.0000 mg | PREFILLED_SYRINGE | Freq: Every day | INTRAMUSCULAR | Status: DC
  Administered 2021-06-17 – 2021-06-19 (×3): 40 mg via SUBCUTANEOUS
  Filled 2021-06-17 (×3): qty 0.4

## 2021-06-17 MED ORDER — SENNOSIDES-DOCUSATE SODIUM 8.6-50 MG PO TABS
1.0000 | ORAL_TABLET | Freq: Every day | ORAL | Status: DC
  Administered 2021-06-17: 1 via ORAL
  Filled 2021-06-17 (×2): qty 1

## 2021-06-17 MED ORDER — ONDANSETRON HCL 4 MG/2ML IJ SOLN
4.0000 mg | Freq: Once | INTRAMUSCULAR | Status: AC | PRN
  Administered 2021-06-17: 4 mg via INTRAVENOUS

## 2021-06-17 MED ORDER — ONDANSETRON HCL 4 MG/2ML IJ SOLN
INTRAMUSCULAR | Status: DC | PRN
  Administered 2021-06-17: 4 mg via INTRAVENOUS

## 2021-06-17 MED ORDER — FENTANYL CITRATE PF 50 MCG/ML IJ SOSY: 25.0000 ug | PREFILLED_SYRINGE | INTRAMUSCULAR | Status: DC | PRN

## 2021-06-17 MED ORDER — ALBUTEROL SULFATE (2.5 MG/3ML) 0.083% IN NEBU
2.5000 mg | INHALATION_SOLUTION | Freq: Three times a day (TID) | RESPIRATORY_TRACT | Status: DC
  Administered 2021-06-18 (×3): 2.5 mg via RESPIRATORY_TRACT
  Filled 2021-06-17 (×3): qty 3

## 2021-06-17 MED ORDER — DEXAMETHASONE SODIUM PHOSPHATE 10 MG/ML IJ SOLN
INTRAMUSCULAR | Status: DC | PRN
  Administered 2021-06-17: 10 mg via INTRAVENOUS

## 2021-06-17 MED ORDER — SODIUM CHLORIDE 0.9 % IV SOLN
12.5000 mg | Freq: Once | INTRAVENOUS | Status: AC
  Administered 2021-06-18: 12.5 mg via INTRAVENOUS
  Filled 2021-06-17: qty 12.5

## 2021-06-17 MED ORDER — FENTANYL CITRATE (PF) 100 MCG/2ML IJ SOLN
INTRAMUSCULAR | Status: AC
  Filled 2021-06-17: qty 2

## 2021-06-17 MED ORDER — ROCURONIUM BROMIDE 10 MG/ML (PF) SYRINGE
PREFILLED_SYRINGE | INTRAVENOUS | Status: AC
  Filled 2021-06-17: qty 10

## 2021-06-17 MED ORDER — ACETAMINOPHEN 500 MG PO TABS
1000.0000 mg | ORAL_TABLET | Freq: Four times a day (QID) | ORAL | Status: DC
  Administered 2021-06-17 – 2021-06-19 (×6): 1000 mg via ORAL
  Filled 2021-06-17 (×6): qty 2

## 2021-06-17 MED ORDER — BUPIVACAINE LIPOSOME 1.3 % IJ SUSP
INTRAMUSCULAR | Status: AC
  Filled 2021-06-17: qty 20

## 2021-06-17 MED ORDER — ACETAMINOPHEN 500 MG PO TABS
1000.0000 mg | ORAL_TABLET | Freq: Once | ORAL | Status: AC
Start: 2021-06-17 — End: 2021-06-17
  Administered 2021-06-17: 1000 mg via ORAL
  Filled 2021-06-17: qty 2

## 2021-06-17 MED ORDER — PROPOFOL 10 MG/ML IV BOLUS
INTRAVENOUS | Status: AC
  Filled 2021-06-17: qty 20

## 2021-06-17 MED ORDER — NICOTINE POLACRILEX 2 MG MT LOZG: 2.0000 mg | LOZENGE | OROMUCOSAL | Status: DC | PRN

## 2021-06-17 MED ORDER — MIDAZOLAM HCL 2 MG/2ML IJ SOLN
INTRAMUSCULAR | Status: AC
  Filled 2021-06-17: qty 2

## 2021-06-17 MED ORDER — 0.9 % SODIUM CHLORIDE (POUR BTL) OPTIME
TOPICAL | Status: DC | PRN
  Administered 2021-06-17: 2000 mL

## 2021-06-17 MED ORDER — SODIUM CHLORIDE FLUSH 0.9 % IV SOLN
INTRAVENOUS | Status: DC | PRN
  Administered 2021-06-17: 100 mL

## 2021-06-17 MED ORDER — LIDOCAINE 2% (20 MG/ML) 5 ML SYRINGE
INTRAMUSCULAR | Status: AC
  Filled 2021-06-17: qty 5

## 2021-06-17 MED ORDER — CEFAZOLIN SODIUM-DEXTROSE 2-4 GM/100ML-% IV SOLN
2.0000 g | INTRAVENOUS | Status: AC
  Administered 2021-06-17: 2 g via INTRAVENOUS
  Filled 2021-06-17: qty 100

## 2021-06-17 MED ORDER — ACETAMINOPHEN 160 MG/5ML PO SOLN: 1000.0000 mg | Freq: Four times a day (QID) | ORAL | Status: DC

## 2021-06-17 MED ORDER — TRAMADOL HCL 50 MG PO TABS
50.0000 mg | ORAL_TABLET | Freq: Four times a day (QID) | ORAL | Status: DC | PRN
  Administered 2021-06-17 – 2021-06-18 (×2): 100 mg via ORAL
  Filled 2021-06-17: qty 2

## 2021-06-17 MED ORDER — FENTANYL CITRATE (PF) 250 MCG/5ML IJ SOLN
INTRAMUSCULAR | Status: AC
  Filled 2021-06-17: qty 5

## 2021-06-17 MED ORDER — SUGAMMADEX SODIUM 200 MG/2ML IV SOLN
INTRAVENOUS | Status: DC | PRN
  Administered 2021-06-17: 200 mg via INTRAVENOUS

## 2021-06-17 MED ORDER — CHLORHEXIDINE GLUCONATE 0.12 % MT SOLN
15.0000 mL | Freq: Once | OROMUCOSAL | Status: AC
  Administered 2021-06-17: 15 mL via OROMUCOSAL
  Filled 2021-06-17: qty 15

## 2021-06-17 MED ORDER — LACTATED RINGERS IV SOLN: INTRAVENOUS | Status: DC | PRN

## 2021-06-17 MED ORDER — LACTATED RINGERS IV SOLN: INTRAVENOUS | Status: DC

## 2021-06-17 MED ORDER — BUPIVACAINE HCL (PF) 0.5 % IJ SOLN
INTRAMUSCULAR | Status: AC
  Filled 2021-06-17: qty 30

## 2021-06-17 MED ORDER — TRAMADOL HCL 50 MG PO TABS
ORAL_TABLET | ORAL | Status: AC
  Filled 2021-06-17: qty 2

## 2021-06-17 MED ORDER — PROPOFOL 10 MG/ML IV BOLUS
INTRAVENOUS | Status: DC | PRN
  Administered 2021-06-17: 140 mg via INTRAVENOUS

## 2021-06-17 MED ORDER — LIDOCAINE 2% (20 MG/ML) 5 ML SYRINGE
INTRAMUSCULAR | Status: DC | PRN
  Administered 2021-06-17: 100 mg via INTRAVENOUS

## 2021-06-17 MED ORDER — ONDANSETRON HCL 4 MG/2ML IJ SOLN
4.0000 mg | Freq: Four times a day (QID) | INTRAMUSCULAR | Status: DC | PRN
  Administered 2021-06-17: 4 mg via INTRAVENOUS
  Filled 2021-06-17 (×2): qty 2

## 2021-06-17 MED ORDER — ROCURONIUM BROMIDE 10 MG/ML (PF) SYRINGE
PREFILLED_SYRINGE | INTRAVENOUS | Status: DC | PRN
Start: 2021-06-17 — End: 2021-06-17
  Administered 2021-06-17: 60 mg via INTRAVENOUS
  Administered 2021-06-17: 20 mg via INTRAVENOUS

## 2021-06-17 MED ORDER — CEFAZOLIN SODIUM-DEXTROSE 2-4 GM/100ML-% IV SOLN
2.0000 g | Freq: Three times a day (TID) | INTRAVENOUS | Status: AC
  Administered 2021-06-17 – 2021-06-18 (×2): 2 g via INTRAVENOUS
  Filled 2021-06-17 (×2): qty 100

## 2021-06-17 MED ORDER — ORAL CARE MOUTH RINSE: 15.0000 mL | Freq: Once | OROMUCOSAL | Status: AC

## 2021-06-17 MED ORDER — LEVOTHYROXINE SODIUM 25 MCG PO TABS
25.0000 ug | ORAL_TABLET | Freq: Every day | ORAL | Status: DC
  Administered 2021-06-18 – 2021-06-19 (×2): 25 ug via ORAL
  Filled 2021-06-17 (×2): qty 1

## 2021-06-17 SURGICAL SUPPLY — 77 items
BLADE STERNUM SYSTEM 6 (BLADE) IMPLANT
CANNULA REDUC XI 12-8 STAPL (CANNULA) ×1
CANNULA REDUCER 12-8 DVNC XI (CANNULA) IMPLANT
CHLORAPREP W/TINT 26 (MISCELLANEOUS) ×2 IMPLANT
CNTNR URN SCR LID CUP LEK RST (MISCELLANEOUS) ×6 IMPLANT
CONT SPEC 4OZ STRL OR WHT (MISCELLANEOUS) ×2
DEFOGGER SCOPE WARMER CLEARIFY (MISCELLANEOUS) ×2 IMPLANT
DERMABOND ADVANCED (GAUZE/BANDAGES/DRESSINGS) ×1
DERMABOND ADVANCED .7 DNX12 (GAUZE/BANDAGES/DRESSINGS) ×1 IMPLANT
DRAIN CHANNEL 19F RND (DRAIN) ×1 IMPLANT
DRAIN CONNECTOR BLAKE 1:1 (MISCELLANEOUS) ×1 IMPLANT
DRAPE ARM DVNC X/XI (DISPOSABLE) ×4 IMPLANT
DRAPE COLUMN DVNC XI (DISPOSABLE) ×1 IMPLANT
DRAPE CV SPLIT W-CLR ANES SCRN (DRAPES) ×2 IMPLANT
DRAPE DA VINCI XI ARM (DISPOSABLE) ×4
DRAPE DA VINCI XI COLUMN (DISPOSABLE) ×1
DRAPE ORTHO SPLIT 77X108 STRL (DRAPES) ×1
DRAPE SURG ORHT 6 SPLT 77X108 (DRAPES) ×1 IMPLANT
ELECT REM PT RETURN 9FT ADLT (ELECTROSURGICAL)
ELECTRODE REM PT RTRN 9FT ADLT (ELECTROSURGICAL) IMPLANT
FELT TEFLON 1X6 (MISCELLANEOUS) IMPLANT
GAUZE 4X4 16PLY ~~LOC~~+RFID DBL (SPONGE) IMPLANT
GAUZE KITTNER 4X5 RF (MISCELLANEOUS) ×5 IMPLANT
GAUZE SPONGE 4X4 12PLY STRL (GAUZE/BANDAGES/DRESSINGS) ×1 IMPLANT
GLOVE BIO SURGEON STRL SZ7 (GLOVE) ×4 IMPLANT
GOWN STRL REUS W/ TWL LRG LVL3 (GOWN DISPOSABLE) ×1 IMPLANT
GOWN STRL REUS W/ TWL XL LVL3 (GOWN DISPOSABLE) ×2 IMPLANT
GOWN STRL REUS W/TWL 2XL LVL3 (GOWN DISPOSABLE) ×2 IMPLANT
GOWN STRL REUS W/TWL LRG LVL3 (GOWN DISPOSABLE) ×1
GOWN STRL REUS W/TWL XL LVL3 (GOWN DISPOSABLE) ×2
HEMOSTAT SURGICEL 2X14 (HEMOSTASIS) ×2 IMPLANT
IRRIGATOR SUCT 8 DISP DVNC XI (IRRIGATION / IRRIGATOR) IMPLANT
IRRIGATOR SUCTION 8MM XI DISP (IRRIGATION / IRRIGATOR) ×1
KIT SUCTION CATH 14FR (SUCTIONS) IMPLANT
NEEDLE HYPO 22GX1.5 SAFETY (NEEDLE) ×2 IMPLANT
NS IRRIG 1000ML POUR BTL (IV SOLUTION) ×2 IMPLANT
PACK CHEST (CUSTOM PROCEDURE TRAY) ×2 IMPLANT
PAD ARMBOARD 7.5X6 YLW CONV (MISCELLANEOUS) ×4 IMPLANT
PAD ELECT DEFIB RADIOL ZOLL (MISCELLANEOUS) ×2 IMPLANT
SEAL CANN UNIV 5-8 DVNC XI (MISCELLANEOUS) ×4 IMPLANT
SEAL XI 5MM-8MM UNIVERSAL (MISCELLANEOUS) ×3
SEALER VESSEL DA VINCI XI (MISCELLANEOUS) ×1
SEALER VESSEL EXT DVNC XI (MISCELLANEOUS) IMPLANT
SET TRI-LUMEN FLTR TB AIRSEAL (TUBING) ×2 IMPLANT
SOLUTION ELECTROLUBE (MISCELLANEOUS) ×2 IMPLANT
SPONGE T-LAP 18X18 ~~LOC~~+RFID (SPONGE) ×2 IMPLANT
SPONGE TONSIL 1 RF SGL (DISPOSABLE) ×1 IMPLANT
STAPLER CANNULA SEAL DVNC XI (STAPLE) IMPLANT
STAPLER CANNULA SEAL XI (STAPLE)
STOPCOCK 4 WAY LG BORE MALE ST (IV SETS) IMPLANT
SUT MNCRL AB 3-0 PS2 18 (SUTURE) IMPLANT
SUT PDS AB 1 CTX 36 (SUTURE) IMPLANT
SUT SILK  1 MH (SUTURE) ×1
SUT SILK 1 MH (SUTURE) ×1 IMPLANT
SUT SILK 2 0 SH (SUTURE) ×1 IMPLANT
SUT SILK 2 0 SH CR/8 (SUTURE) IMPLANT
SUT STEEL 6MS V (SUTURE) IMPLANT
SUT STEEL SZ 6 DBL 3X14 BALL (SUTURE) IMPLANT
SUT VIC AB 2-0 CT1 27 (SUTURE) ×1
SUT VIC AB 2-0 CT1 TAPERPNT 27 (SUTURE) ×1 IMPLANT
SUT VIC AB 2-0 CTX 36 (SUTURE) IMPLANT
SUT VIC AB 3-0 SH 27 (SUTURE) ×2
SUT VIC AB 3-0 SH 27X BRD (SUTURE) ×2 IMPLANT
SUT VICRYL 0 TIES 12 18 (SUTURE) ×2 IMPLANT
SUT VICRYL 0 UR6 27IN ABS (SUTURE) ×4 IMPLANT
SYR 10ML LL (SYRINGE) IMPLANT
SYR 20CC LL (SYRINGE) ×2 IMPLANT
SYR 50ML LL SCALE MARK (SYRINGE) IMPLANT
SYSTEM RETRIEVAL ANCHOR 8 (MISCELLANEOUS) ×1 IMPLANT
SYSTEM SAHARA CHEST DRAIN ATS (WOUND CARE) ×1 IMPLANT
TAPE CLOTH SURG 4X10 WHT LF (GAUZE/BANDAGES/DRESSINGS) ×1 IMPLANT
TOWEL GREEN STERILE (TOWEL DISPOSABLE) IMPLANT
TOWEL GREEN STERILE FF (TOWEL DISPOSABLE) IMPLANT
TRAY FOLEY SLVR 16FR TEMP STAT (SET/KITS/TRAYS/PACK) IMPLANT
TROCAR PORT AIRSEAL 12X150 (TUBING) IMPLANT
TROCAR PORT AIRSEAL 8X120 (TROCAR) ×1 IMPLANT
TUBING EXTENTION W/L.L. (IV SETS) IMPLANT

## 2021-06-17 NOTE — Anesthesia Procedure Notes (Signed)
Procedure Name: Intubation ?Date/Time: 06/17/2021 8:00 AM ?Performed by: Gaylene Brooks, CRNA ?Pre-anesthesia Checklist: Patient identified, Emergency Drugs available, Suction available and Patient being monitored ?Patient Re-evaluated:Patient Re-evaluated prior to induction ?Oxygen Delivery Method: Circle System Utilized ?Preoxygenation: Pre-oxygenation with 100% oxygen ?Induction Type: IV induction ?Ventilation: Mask ventilation without difficulty and Oral airway inserted - appropriate to patient size ?Laryngoscope Size: Sabra Heck and 2 ?Grade View: Grade I ?Tube type: Oral ?Endobronchial tube: Left, Double lumen EBT, EBT position confirmed by auscultation and EBT position confirmed by fiberoptic bronchoscope and 37 Fr ?Number of attempts: 1 ?Airway Equipment and Method: Stylet and Oral airway ?Placement Confirmation: ETT inserted through vocal cords under direct vision, positive ETCO2 and breath sounds checked- equal and bilateral ?Tube secured with: Tape ?Dental Injury: Teeth and Oropharynx as per pre-operative assessment  ? ? ? ? ?

## 2021-06-17 NOTE — Anesthesia Postprocedure Evaluation (Signed)
Anesthesia Post Note ? ?Patient: Antonio Scott ? ?Procedure(s) Performed: XI ROBOTIC ASSISTED RESECTION OF MEDIASTINAL MASS (Right: Chest) ?INTERCOSTAL NERVE BLOCK (Right: Chest) ? ?  ? ?Patient location during evaluation: PACU ?Anesthesia Type: General ?Level of consciousness: awake and alert ?Pain management: pain level controlled ?Vital Signs Assessment: post-procedure vital signs reviewed and stable ?Respiratory status: spontaneous breathing, nonlabored ventilation, respiratory function stable and patient connected to nasal cannula oxygen ?Cardiovascular status: blood pressure returned to baseline and stable ?Postop Assessment: no apparent nausea or vomiting ?Anesthetic complications: no ? ? ?No notable events documented. ? ?Last Vitals:  ?Vitals:  ? 06/17/21 1525 06/17/21 1624  ?BP: 137/85 129/88  ?Pulse: 82 75  ?Resp: 16 20  ?Temp:  (!) 35.7 ?C  ?SpO2: 100% 100%  ?  ?Last Pain:  ?Vitals:  ? 06/17/21 1624  ?TempSrc: Axillary  ?PainSc: 4   ? ? ?  ?  ?  ?  ?  ?  ? ?Santa Lighter ? ? ? ? ?

## 2021-06-17 NOTE — Interval H&P Note (Signed)
History and Physical Interval Note: ? ?06/17/2021 ?7:35 AM ? ?KLAY SOBOTKA  has presented today for surgery, with the diagnosis of MEDIASTINAL MASS.  The various methods of treatment have been discussed with the patient and family. After consideration of risks, benefits and other options for treatment, the patient has consented to  Procedure(s): ?XI ROBOTIC ASSISTED RESECTION OF MEDIASTINAL MASS (Right) as a surgical intervention.  The patient's history has been reviewed, patient examined, no change in status, stable for surgery.  I have reviewed the patient's chart and labs.  Questions were answered to the patient's satisfaction.   ? ? ?Lucile Crater Bently Morath ? ? ?

## 2021-06-17 NOTE — Brief Op Note (Signed)
06/17/2021 ? ?9:53 AM ? ?PATIENT:  Antonio Scott  66 y.o. male ? ?PRE-OPERATIVE DIAGNOSIS:  MEDIASTINAL MASS ? ?POST-OPERATIVE DIAGNOSIS:  MEDIASTINAL MASS ? ?PROCEDURE:  Procedure(s): ?XI ROBOTIC ASSISTED RESECTION OF MEDIASTINAL MASS (Right) ?INTERCOSTAL NERVE BLOCK (Right) ? ?SURGEON:  Surgeon(s) and Role: ?   * Lajuana Matte, MD - Primary ? ?PHYSICIAN ASSISTANT: Rynell Ciotti PA-C  ? ?ANESTHESIA:   general ? ?EBL: 75 CC  ? ?BLOOD ADMINISTERED:none ? ?DRAINS: (19) Blake drain(s) in the MEDIASTINUM   ? ?LOCAL MEDICATIONS USED:  OTHER EXPAREL MIXTURE ? ?SPECIMEN:  Source of Specimen:  ANTERIOR MEDIASTINAL MASS ? ?DISPOSITION OF SPECIMEN:  PATHOLOGY ? ?COUNTS:  YES ? ?TOURNIQUET:  * No tourniquets in log * ? ?DICTATION: .Dragon Dictation ? ?PLAN OF CARE: Admit to inpatient  ? ?PATIENT DISPOSITION:  STABLE TO PACU ?  ?Delay start of Pharmacological VTE agent (>24hrs) due to surgical blood loss or risk of bleeding: yes ? ?COMPLICATIONS: NO KNOWN ? ?

## 2021-06-17 NOTE — Discharge Summary (Signed)
Physician Discharge Summary  Patient ID: Antonio Scott MRN: [id number] DOB/AGE: 08/06/1955 66 y.o.  Admit date: 06/17/2021 Discharge date: 06/19/2021  Admission Diagnoses: Mediastinal mass  Discharge Diagnoses:   Patient Active Problem List   Diagnosis Date Noted   Mediastinal mass 04/08/2021   Acquired hypothyroidism 11/16/2020   Mixed hyperlipidemia 11/16/2020   Elevated serum creatinine 11/16/2020   Heavy cigarette smoker 11/15/2020   Nocturia more than twice per night 11/15/2020     Discharged Condition: good  History of Present Illness:    Antonio Scott 66 y.o. male with incidental finding for an anterior mediastinal mass.  He is being followed in the lung cancer screening program.  He does endorse some chest fullness and occasional sharp chest pain.  This is not associated with any activity or exertion.  He also admits to some visual defects on either eye.  This usually is relieved after rubbing it for few minutes.  He denies any other ocular symptoms or respiratory symptoms.   He informed me that his brother died of thymic cancer in 2017/04/30.  He also states that his father had an abnormal growth off of his aorta.  He is currently being treated with Synthroid for hypothyroidism.   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  This was repeated and was essentially normal.  Following evaluation of the patient and his studies Dr. Kipp Scott recommended proceeding with elective resection.  He was admitted this hospitalization for the procedure  Hospital course: The patient was admitted electively on 06/17/2021 and taken to the operating room at which time he underwent robotic assisted resection of anterior mediastinal mass.  He tolerated the procedure well and was taken to the surgical recovery room in stable condition.  Postoperative hospital course:  The  patient is doing well.  On postoperative day #1 his chest tube was removed.  He is noted not to have an anemia but he does have a reactive leukocytosis with a white blood cell count of 20.3.  This was monitored clinically and trending lower.[name]  He remained afebrile.[name]  He has a mild postoperative thrombocytopenia.  It is trending higher over time.  His creatinine has remained stable in its baseline range of 1.7. Incisions are healing well without evidence of infection.  He did have some early postoperative nausea and vomiting but this has resolved with routine measures.  He did have some difficulty with pain control but this improved over time.  Oxygen was weaned and he maintains good saturations on room air.  He is tolerating routine post surgical progression without difficulty.  Chest x-ray has remained stable in appearance.  Overall at the time of discharge the patient is felt to be quite stable.  It is noted that final pathology is still pending.   Consults: None  Significant Diagnostic Studies:  DG Chest 1 View  Result Date: 06/18/2021 CLINICAL DATA:  Right chest tube removal.  Chest pain. EXAM: CHEST  1 VIEW COMPARISON:  Radiographs 06/18/2021 and 06/17/2021.  CT 02/11/2021. FINDINGS: 1307 hours. Interval right chest tube removal. No evidence of pneumothorax. Lower lung volumes with increased opacities at both lung bases, likely reflecting atelectasis. The heart size and mediastinal contours are stable allowing for the lower lung volumes. Persistent soft tissue emphysema laterally in the lower right chest wall. No evidence of acute fracture. Telemetry leads overlie the chest. IMPRESSION: Lower lung  volumes and probable bibasilar atelectasis following right chest removal. No evidence of pneumothorax. Electronically Signed   By: Richardean Sale M.D.   On: 06/18/2021 13:21   DG Chest 2 View  Result Date: 06/17/2021 CLINICAL DATA:  Anterior mediastinal mass.  Preoperative evaluation. EXAM: CHEST - 2 VIEW  COMPARISON:  CT 02/11/2021.  PET-CT 03/07/2021. FINDINGS: The heart size and mediastinal contours are stable. Retrosternal density on the lateral view likely corresponds with the patient's known anterior mediastinal mass. The lungs appear clear. There is no pleural effusion or pneumothorax. No acute osseous findings are identified. IMPRESSION: Grossly stable anterior mediastinal mass. No acute cardiopulmonary process. Electronically Signed   By: Richardean Sale M.D.   On: 06/17/2021 11:12   DG CHEST PORT 1 VIEW  Result Date: 06/19/2021 CLINICAL DATA:  Reason for exam: surgery follow up, mediastinal mass Patient denies any sob or chest pains. Hx of XI ROBOTIC ASSISTED RESECTION OF MEDIASTINAL MASS right 06/17/2021. EXAM: PORTABLE CHEST - 1 VIEW COMPARISON:  the previous day's study FINDINGS: Relatively low lung volumes as before. Coarse bibasilar alveolar airspace opacity slightly increased on the left, now obscuring portions of the left diaphragmatic leaflet. Heart size and mediastinal contours are within normal limits. No effusion. Visualized bones unremarkable. IMPRESSION: Low volumes.  Some worsening of left lower lung airspace opacities. Electronically Signed   By: Lucrezia Europe M.D.   On: 06/19/2021 07:08   DG Chest Port 1 View  Result Date: 06/18/2021 CLINICAL DATA:  Provided history: Mediastinal mass. Shortness of breath. Chest pain. History of robotic assisted resection of mediastinal mass. EXAM: PORTABLE CHEST 1 VIEW COMPARISON:  Prior chest radiographs 06/17/2021 and earlier. PET-CT 03/07/2021. MRI chest 03/06/2021. FINDINGS: Stable position of a right-sided chest tube terminating in the region of the mediastinum. Unchanged opacity along the medial aspect of the right upper lobe, which may reflect atelectasis or postoperative mediastinal fluid. Mild atelectasis within the medial right middle lobe and right lower lobe. No appreciable airspace consolidation within the left lung. No evidence of pleural  effusion or pneumothorax. Mild subcutaneous emphysema along the right chest wall. IMPRESSION: Stable position of a right-sided chest tube terminating in the region of the mediastinum. Unchanged opacity along the medial aspect of the right upper lobe, which may reflect atelectasis or postoperative mediastinal fluid. Mild atelectasis within the medial right middle lobe and right lower lobe. Persistent mild subcutaneous emphysema within the right chest wall. Electronically Signed   By: Kellie Simmering D.O.   On: 06/18/2021 08:28   DG Chest Port 1 View  Result Date: 06/17/2021 CLINICAL DATA:  Status post robotic assisted mediastinal mass resection. EXAM: PORTABLE CHEST 1 VIEW COMPARISON:  Radiographs 06/14/2021. CT 02/11/2021. PET-CT 03/07/2021. FINDINGS: 1040 hours. Right-sided chest tube tip projects over the carina. There are lower lung volumes with mild right chest wall soft tissue emphysema. No pneumothorax or significant pleural effusion identified. The lungs are clear. The heart size and mediastinal contours are stable for the degree of inspiration. IMPRESSION: Interval postsurgical changes as described with mild soft tissue emphysema in the right chest wall. No evidence of pneumothorax. Electronically Signed   By: Richardean Sale M.D.   On: 06/17/2021 11:10    Treatments: surgery:  06/17/2021   Patient:  Vena Austria Pre-Op Dx: Anterior mediastinal mass Post-op Dx: Same Procedure: - Robotic assisted right video thoracoscopy - Resection of anterior mediastinal mass - Intercostal nerve block   Surgeon and Role:      * Lightfoot, Lucile Crater, MD -  Primary    Evonnie Pat, PA-C- assisting at the bedside Anesthesia  general   Discharge Exam: Blood pressure 122/76, pulse 87, temperature (!) 97.5 F (36.4 C), temperature source Oral, resp. rate 20, height '5\' 11"'$  (1.803 m), weight 79 kg, SpO2 94 %.    General appearance: alert, cooperative, and no distress Heart: regular rate and rhythm Lungs: clear  to auscultation bilaterally Abdomen: benign Extremities: no edema or calf tenderness Wound: incis healing well   Disposition: Discharge disposition: 01-Home or Self Care       Discharge Instructions     Discharge patient   Complete by: As directed    Discharge disposition: 01-Home or Self Care   Discharge patient date: 06/19/2021      Allergies as of 06/19/2021   No Known Allergies      Medication List     TAKE these medications    levothyroxine 25 MCG tablet Commonly known as: SYNTHROID Take 1 tablet (25 mcg total) by mouth daily.   neomycin-bacitracin-polymyxin ointment Commonly known as: NEOSPORIN Apply 1 application. topically 2 (two) times daily as needed for wound care.   nicotine polacrilex 2 MG lozenge Commonly known as: COMMIT Take 2 mg by mouth as needed for smoking cessation.   traMADol 50 MG tablet Commonly known as: ULTRAM Take 1 tablet (50 mg total) by mouth every 6 (six) hours as needed for up to 7 days (mild pain).        Follow-up Information     Lightfoot, Lucile Crater, MD Follow up.   Specialty: Cardiothoracic Surgery Why: Please see discharge paperwork for follow-up appointment with surgeon.  Please obtain a chest x-ray at [facility] 1/2-hour prior to appointment.  It is located in the same office complex on the first floor. Contact information: [place] [address] [phone]                 Signed: John Giovanni PA-C 06/19/2021, 1:51 PM

## 2021-06-17 NOTE — Op Note (Signed)
? ?   ?  [address] ?      [name] [address] ?            [phone]      ? ? ?06/17/2021 ? ?Patient:  Antonio Scott ?Pre-Op Dx: Anterior mediastinal mass ?Post-op Dx: Same ?Procedure: ?- Robotic assisted right video thoracoscopy ?- Resection of anterior mediastinal mass ?- Intercostal nerve block ? ?Surgeon and Role:   ?   * Lajuana Matte, MD - Primary ?   Evonnie Pat, PA-C- assisting at the bedside ?Anesthesia  general ?EBL: 10 ml ?Blood Administration: None ?Specimen: Anterior mediastinal mass ? ?Drains: 60 F chest tube in right chest ?Counts: correct ? ? 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  His cross-sectional imaging and tumor markers have all been reviewed.  Recommended that he undergo an excisional biopsy.  This will consist of a right robotic assisted thoracoscopy with resection of anterior mediastinal mass.  The risk and benefits were discussed and he is agreeable to proceed. ? ?Findings: ?Anterior mediastinal mass was evident.  We were able to mobilize most of it however it was deeply invested in the pericardium.  Due to concern for violating the capsule into the pericardium and took this en bloc with the mass. ? ?Operative Technique: ?After the risks, benefits and alternatives were thoroughly discussed, the patient was brought to the operative theatre.  Anesthesia was induced, and the patient was then placed in a left lazy lateral decubitus position and was prepped and draped in normal sterile fashion.  An appropriate surgical pause was performed, and pre-operative antibiotics were dosed accordingly. ? ?We began by placing our 3 robotic ports in the the intercostal spaces targeting the pericardium.  A 25m assistant port was placed in the 9th intercostal space in the posterior axillary line.  The robot was then docked and all instruments were passed under direct visualization.  The mass was evident anterior to the pericardium.   We began with a lateral to medial dissection taking care not to injure the phrenic nerve.  The dissection was carried posterior to the mass along the pericardium.  The left pleural space was entered.  We then continued to dissect all mediastinal subcutaneous tissue off the sternum.  We then continued our dissection into the neck and brought down the thymic horns.  The innominate vein was identified, and the tissue was carefully dissected away from it.  The pericardium above the aorta had to be open to get the mass off completely without violating the capsule.  Meticulous hemostasis was obtained.   ? ?The mass was then placed in an endocatch bag, and removed from the assistant point site.  All instruments were removed, and the robot was undocked.  A 19 [place] drain was passed through right inferior robotic port into the thoracic space.  An intercostal nerve block was performed under direct visualization.  The skin and soft tissue were closed with absorbable suture   ? ?The patient tolerated the procedure without any immediate complications, and was transferred to the PACU in stable condition. ? ?HLajuana Matte ?

## 2021-06-17 NOTE — Transfer of Care (Signed)
Immediate Anesthesia Transfer of Care Note ? ?Patient: Antonio Scott ? ?Procedure(s) Performed: XI ROBOTIC ASSISTED RESECTION OF MEDIASTINAL MASS (Right: Chest) ?INTERCOSTAL NERVE BLOCK (Right: Chest) ? ?Patient Location: PACU ? ?Anesthesia Type:General ? ?Level of Consciousness: awake, alert , oriented and patient cooperative ? ?Airway & Oxygen Therapy: Patient Spontanous Breathing and Patient connected to face mask oxygen ? ?Post-op Assessment: Report given to RN, Post -op Vital signs reviewed and stable and Patient moving all extremities X 4 ? ?Post vital signs: Reviewed and stable ? ?Last Vitals:  ?Vitals Value Taken Time  ?BP    ?Temp    ?Pulse 66 06/17/21 1023  ?Resp    ?SpO2 85 % 06/17/21 1023  ?Vitals shown include unvalidated device data. ? ?Last Pain:  ?Vitals:  ? 06/17/21 0622  ?TempSrc: Oral  ?PainSc: 0-No pain  ?   ? ?  ? ?Complications: No notable events documented. ?

## 2021-06-17 NOTE — Hospital Course (Addendum)
History of Present Illness:    ?Antonio Scott 66 y.o. male with incidental finding for an anterior mediastinal mass.  He is being followed in the lung cancer screening program.  He does endorse some chest fullness and occasional sharp chest pain.  This is not associated with any activity or exertion.  He also admits to some visual defects on either eye.  This usually is relieved after rubbing it for few minutes.  He denies any other ocular symptoms or respiratory symptoms. ?  ?He informed me that his brother died of thymic cancer in 05/12/2017.  He also states that his father had an abnormal growth off of his aorta.  He is currently being treated with Synthroid for hypothyroidism. ?  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  This was repeated and was essentially normal. ? ?Following evaluation of the patient and his studies Dr. Kipp Brood recommended proceeding with elective resection.  He was admitted this hospitalization for the procedure ? ?Hospital course: The patient was admitted electively on 06/17/2021 and taken to the operating room at which time he underwent robotic assisted resection of anterior mediastinal mass.  He tolerated the procedure well and was taken to the surgical recovery room in stable condition. ? ?Postoperative hospital course: ? ?The patient is doing well.  On postoperative day #1 his chest tube was removed.  He is noted not to have an anemia but he does have a reactive leukocytosis with a white blood cell count of 20.3.  This will be monitored medically.  He has a mild postoperative thrombocytopenia.  His creatinine has remained stable in its baseline range of 1.7.  He is tolerating routine postoperative activity advancement.  Incisions are healing well without evidence of infection.  He did have some early postoperative nausea and vomiting but this has resolved with  routine measures. ?

## 2021-06-18 ENCOUNTER — Encounter (HOSPITAL_COMMUNITY): Payer: Self-pay | Admitting: Thoracic Surgery (Cardiothoracic Vascular Surgery)

## 2021-06-18 ENCOUNTER — Inpatient Hospital Stay (HOSPITAL_COMMUNITY): Payer: Medicare HMO

## 2021-06-18 LAB — CBC
HCT: 40.9 % (ref 39.0–52.0)
Hemoglobin: 13.4 g/dL (ref 13.0–17.0)
MCH: 28.6 pg (ref 26.0–34.0)
MCHC: 32.8 g/dL (ref 30.0–36.0)
MCV: 87.2 fL (ref 80.0–100.0)
Platelets: 137 10*3/uL — ABNORMAL LOW (ref 150–400)
RBC: 4.69 MIL/uL (ref 4.22–5.81)
RDW: 14.4 % (ref 11.5–15.5)
WBC: 20.3 10*3/uL — ABNORMAL HIGH (ref 4.0–10.5)
nRBC: 0 % (ref 0.0–0.2)

## 2021-06-18 LAB — BASIC METABOLIC PANEL
Anion gap: 9 (ref 5–15)
BUN: 16 mg/dL (ref 8–23)
CO2: 27 mmol/L (ref 22–32)
Calcium: 9.2 mg/dL (ref 8.9–10.3)
Chloride: 100 mmol/L (ref 98–111)
Creatinine, Ser: 1.78 mg/dL — ABNORMAL HIGH (ref 0.61–1.24)
GFR, Estimated: 42 mL/min — ABNORMAL LOW (ref >60)
Glucose, Bld: 141 mg/dL — ABNORMAL HIGH (ref 70–99)
Potassium: 4.1 mmol/L (ref 3.5–5.1)
Sodium: 136 mmol/L (ref 135–145)

## 2021-06-18 MED ORDER — ALBUTEROL SULFATE (2.5 MG/3ML) 0.083% IN NEBU
2.5000 mg | INHALATION_SOLUTION | Freq: Two times a day (BID) | RESPIRATORY_TRACT | Status: DC
  Administered 2021-06-19: 2.5 mg via RESPIRATORY_TRACT
  Filled 2021-06-18: qty 3

## 2021-06-18 MED ORDER — TRAZODONE HCL 50 MG PO TABS
25.0000 mg | ORAL_TABLET | Freq: Every day | ORAL | Status: DC
  Administered 2021-06-18: 25 mg via ORAL
  Filled 2021-06-18: qty 1

## 2021-06-18 MED ORDER — DM-GUAIFENESIN ER 30-600 MG PO TB12
1.0000 | ORAL_TABLET | Freq: Two times a day (BID) | ORAL | Status: DC
  Administered 2021-06-18 – 2021-06-19 (×3): 1 via ORAL
  Filled 2021-06-18 (×3): qty 1

## 2021-06-18 NOTE — Progress Notes (Signed)
?   06/18/21 1200  ?Assess: MEWS Score  ?BP 112/77  ?Pulse Rate (!) 102  ?ECG Heart Rate (!) 102  ?Resp (!) 21  ?SpO2 98 %  ?Assess: MEWS Score  ?MEWS Temp 0  ?MEWS Systolic 0  ?MEWS Pulse 1  ?MEWS RR 1  ?MEWS LOC 0  ?MEWS Score 2  ?MEWS Score Color Yellow  ?Assess: if the MEWS score is Yellow or Red  ?Were vital signs taken at a resting state? Yes  ?Focused Assessment No change from prior assessment  ?Early Detection of Sepsis Score *See Row Information* High  ?MEWS guidelines implemented *See Row Information* Yes  ?Treat  ?MEWS Interventions Escalated (See documentation below)  ?Pain Scale 0-10  ?Pain Score 6  ?Pain Type Surgical pain  ?Pain Location Chest  ?Pain Descriptors / Indicators Aching  ?Pain Frequency Intermittent  ?Pain Onset On-going  ?Pain Intervention(s) Medication (See eMAR);Repositioned;Emotional support  ?Take Vital Signs  ?Increase Vital Sign Frequency  Yellow: Q 2hr X 2 then Q 4hr X 2, if remains yellow, continue Q 4hrs  ?Escalate  ?MEWS: Escalate Yellow: discuss with charge nurse/RN and consider discussing with provider and RRT  ?Notify: Charge Nurse/RN  ?Name of Charge Nurse/RN Notified April  ?Date Charge Nurse/RN Notified 06/18/21  ?Time Charge Nurse/RN Notified 1215  ?Notify: Provider  ?Provider Name/Title Jadene Pierini  ?Date Provider Notified 06/18/21  ?Time Provider Notified 1215  ?Method of Notification Page  ?Notification Reason Change in status  ?Provider response At bedside  ?Date of Provider Response 06/18/21  ?Time of Provider Response 1220  ?Document  ?Patient Outcome Other (Comment) ?(PA notified and at bedside)  ?Progress note created (see row info) Yes  ? ? ?

## 2021-06-18 NOTE — Progress Notes (Addendum)
? ?   ?[address] ?      [name] [address] ?            [phone]   ? ?  1 Day Post-Op Procedure(s) (LRB): ?XI ROBOTIC ASSISTED RESECTION OF MEDIASTINAL MASS (Right) ?INTERCOSTAL NERVE BLOCK (Right) ?Subjective: ?Some nausea and vomiting overnight- better this am ? ?Objective: ?Vital signs in last 24 hours: ?Temp:  [96.2 ?F (35.7 ?C)-98.4 ?F (36.9 ?C)] 97.6 ?F (36.4 ?C) (05/16 0732) ?Pulse Rate:  [63-82] 79 (05/16 0732) ?Cardiac Rhythm: Sinus tachycardia (05/16 0714) ?Resp:  [11-22] 19 (05/16 0732) ?BP: (102-138)/(61-93) 104/66 (05/16 0732) ?SpO2:  [91 %-100 %] 100 % (05/16 0418) ? ?Hemodynamic parameters for last 24 hours: ?  ? ?Intake/Output from previous day: ?05/15 0701 - 05/16 0700 ?In: 2270 [P.O.:720; I.V.:1100; IV Piggyback:300] ?Out: 860 [Urine:670; Blood:50; Chest Tube:140] ?Intake/Output this shift: ?No intake/output data recorded. ? ?General appearance: alert, cooperative, and no distress ?Heart: regular rate and rhythm ?Lungs: clear to auscultation bilaterally ?Abdomen: soft, non tender ?Extremities: no edema or calf tenderness ?Wound: incis okm some serous drainage in CT dressing ? ?Lab Results: ?No results for input(s): WBC, HGB, HCT, PLT in the last 72 hours. ?BMET:  ?Recent Labs  ?  06/18/21 ?0236  ?NA 136  ?K 4.1  ?CL 100  ?CO2 27  ?GLUCOSE 141*  ?BUN 16  ?CREATININE 1.78*  ?CALCIUM 9.2  ?  ?PT/INR: No results for input(s): LABPROT, INR in the last 72 hours. ?ABG ?   ?Component Value Date/Time  ? PHART 7.43 06/14/2021 1504  ? HCO3 26.5 06/14/2021 1504  ? O2SAT 99.3 06/14/2021 1504  ? ?CBG (last 3)  ?No results for input(s): GLUCAP in the last 72 hours. ? ?Meds ?Scheduled Meds: ? acetaminophen  1,000 mg Oral Q6H  ? Or  ? acetaminophen (TYLENOL) oral liquid 160 mg/5 mL  1,000 mg Oral Q6H  ? albuterol  2.5 mg Nebulization TID  ? bisacodyl  10 mg Oral Daily  ? enoxaparin (LOVENOX) injection  40 mg Subcutaneous Daily  ? levothyroxine  25 mcg Oral Daily  ? senna-docusate  1 tablet  Oral QHS  ? ?Continuous Infusions: ?PRN Meds:.fentaNYL (SUBLIMAZE) injection, nicotine polacrilex, ondansetron (ZOFRAN) IV, traMADol ? ?Xrays ?DG Chest Port 1 View ? ?Result Date: 06/17/2021 ?CLINICAL DATA:  Status post robotic assisted mediastinal mass resection. EXAM: PORTABLE CHEST 1 VIEW COMPARISON:  Radiographs 06/14/2021. CT 02/11/2021. PET-CT 03/07/2021. FINDINGS: 1040 hours. Right-sided chest tube tip projects over the carina. There are lower lung volumes with mild right chest wall soft tissue emphysema. No pneumothorax or significant pleural effusion identified. The lungs are clear. The heart size and mediastinal contours are stable for the degree of inspiration. IMPRESSION: Interval postsurgical changes as described with mild soft tissue emphysema in the right chest wall. No evidence of pneumothorax. Electronically Signed   By: Richardean Sale M.D.   On: 06/17/2021 11:10   ? ?Assessment/Plan: ?S/P Procedure(s) (LRB): ?XI ROBOTIC ASSISTED RESECTION OF MEDIASTINAL MASS (Right) ?INTERCOSTAL NERVE BLOCK (Right) ?POD#1 ? ?1 afeb, VSS ?2 sats good on RA ?3 CT 140 cc-serosang,  no air leak, poss remove later today ?4 UOP - fair ?5 creat stable at 1.78., Preop 1.74 ?6 CBC pending ?7 no pntx, no significant effus/infilts ?8 routine activity progression and pulm hygiene ?9 lovenox for DVT ppx ? ? ? ? LOS: 1 day  ? ? ?John Giovanni PA-C ?Pager [phone] ?06/18/2021 ?  ? ?Agree with above. ?We will remove chest tube  today. ?Dispo planning. ? ?Lajuana Matte ? ?

## 2021-06-18 NOTE — Progress Notes (Addendum)
?  Mobility Specialist Criteria Algorithm Info. ? ? 06/18/21 1150  ?Mobility  ?Activity Dangled on edge of bed  ?Range of Motion/Exercises Active;All extremities  ?Level of Assistance Independent  ?Assistive Device None  ?Activity Response Tolerated poorly;RN notified  ? ?Patient received in distress c/o severe SOB and 8/10 chest pain. SpO2 desat 84% with positional changes but was >90% while resting. HR elevated 119+. Cued pt to pursed lip breathing which subsequently didn't improve S/Sx. Terminated session and RN notified. Was left in supine with all needs met, call bell in reach. RN present. ? ?06/18/2021 ?11:50 AM ? ?[place] Dezra Mandella, CMS, BS EXP ?Acute Rehabilitation Services  ?BBHQS:[phone] ?Office: [phone] ? ?

## 2021-06-18 NOTE — Plan of Care (Signed)
?  Problem: Cardiac: ?Goal: Will achieve and/or maintain hemodynamic stability ?Outcome: Progressing ?  ?Problem: Pain Management: ?Goal: Pain level will decrease ?Outcome: Progressing ?  ?Problem: Skin Integrity: ?Goal: Wound healing without signs and symptoms infection will improve ?Outcome: Progressing ?  ?Problem: Pain Managment: ?Goal: General experience of comfort will improve ?Outcome: Progressing ?  ?Problem: Activity: ?Goal: Risk for activity intolerance will decrease ?Outcome: Not Progressing ?  ?Problem: Coping: ?Goal: Level of anxiety will decrease ?Outcome: Not Progressing ?  ?Problem: Elimination: ?Goal: Will not experience complications related to bowel motility ?Outcome: Not Progressing ?  ?

## 2021-06-19 ENCOUNTER — Inpatient Hospital Stay (HOSPITAL_COMMUNITY): Payer: Medicare HMO

## 2021-06-19 DIAGNOSIS — R918 Other nonspecific abnormal finding of lung field: Secondary | ICD-10-CM | POA: Diagnosis not present

## 2021-06-19 DIAGNOSIS — R079 Chest pain, unspecified: Secondary | ICD-10-CM | POA: Diagnosis not present

## 2021-06-19 DIAGNOSIS — Z8709 Personal history of other diseases of the respiratory system: Secondary | ICD-10-CM | POA: Diagnosis not present

## 2021-06-19 LAB — COMPREHENSIVE METABOLIC PANEL
ALT: 22 U/L (ref 0–44)
AST: 35 U/L (ref 15–41)
Albumin: 3.6 g/dL (ref 3.5–5.0)
Alkaline Phosphatase: 66 U/L (ref 38–126)
Anion gap: 8 (ref 5–15)
BUN: 26 mg/dL — ABNORMAL HIGH (ref 8–23)
CO2: 26 mmol/L (ref 22–32)
Calcium: 9.2 mg/dL (ref 8.9–10.3)
Chloride: 99 mmol/L (ref 98–111)
Creatinine, Ser: 1.82 mg/dL — ABNORMAL HIGH (ref 0.61–1.24)
GFR, Estimated: 40 mL/min — ABNORMAL LOW (ref >60)
Glucose, Bld: 104 mg/dL — ABNORMAL HIGH (ref 70–99)
Potassium: 3.9 mmol/L (ref 3.5–5.1)
Sodium: 133 mmol/L — ABNORMAL LOW (ref 135–145)
Total Bilirubin: 0.4 mg/dL (ref 0.3–1.2)
Total Protein: 6.6 g/dL (ref 6.5–8.1)

## 2021-06-19 LAB — CBC
HCT: 43.2 % (ref 39.0–52.0)
Hemoglobin: 14.1 g/dL (ref 13.0–17.0)
MCH: 28.5 pg (ref 26.0–34.0)
MCHC: 32.6 g/dL (ref 30.0–36.0)
MCV: 87.4 fL (ref 80.0–100.0)
Platelets: 142 10*3/uL — ABNORMAL LOW (ref 150–400)
RBC: 4.94 MIL/uL (ref 4.22–5.81)
RDW: 14.5 % (ref 11.5–15.5)
WBC: 19 10*3/uL — ABNORMAL HIGH (ref 4.0–10.5)
nRBC: 0 % (ref 0.0–0.2)

## 2021-06-19 MED ORDER — TRAMADOL HCL 50 MG PO TABS: 50.0000 mg | ORAL_TABLET | Freq: Four times a day (QID) | ORAL | 0 refills | Status: AC | PRN

## 2021-06-19 NOTE — Progress Notes (Signed)
? ?   ?[address] ?      [name] [address] ?            [phone]   ? ?  2 Days Post-Op Procedure(s) (LRB): ?XI ROBOTIC ASSISTED RESECTION OF MEDIASTINAL MASS (Right) ?INTERCOSTAL NERVE BLOCK (Right) ?Subjective: ?Mild nausea, pain better controlled, wants to go home ? ?Objective: ?Vital signs in last 24 hours: ?Temp:  [97.5 ?F (36.4 ?C)-98.4 ?F (36.9 ?C)] 97.6 ?F (36.4 ?C) (05/17 0417) ?Pulse Rate:  [76-102] 82 (05/17 0417) ?Cardiac Rhythm: Normal sinus rhythm (05/17 0512) ?Resp:  [16-25] 16 (05/17 0417) ?BP: (99-119)/(63-77) 99/63 (05/17 0417) ?SpO2:  [92 %-98 %] 93 % (05/16 2313) ? ?Hemodynamic parameters for last 24 hours: ?  ? ?Intake/Output from previous day: ?05/16 0701 - 05/17 0700 ?In: -  ?Out: 5956 [Urine:1025; Chest Tube:50] ?Intake/Output this shift: ?No intake/output data recorded. ? ?General appearance: alert, cooperative, and no distress ?Heart: regular rate and rhythm ?Lungs: clear to auscultation bilaterally ?Abdomen: benign ?Extremities: no edema or calf tenderness ?Wound: incis healing well ? ?Lab Results: ?Recent Labs  ?  06/18/21 ?0236 06/19/21 ?3875  ?WBC 20.3* 19.0*  ?HGB 13.4 14.1  ?HCT 40.9 43.2  ?PLT 137* 142*  ? ?BMET:  ?Recent Labs  ?  06/18/21 ?0236 06/19/21 ?6433  ?NA 136 133*  ?K 4.1 3.9  ?CL 100 99  ?CO2 27 26  ?GLUCOSE 141* 104*  ?BUN 16 26*  ?CREATININE 1.78* 1.82*  ?CALCIUM 9.2 9.2  ?  ?PT/INR: No results for input(s): LABPROT, INR in the last 72 hours. ?ABG ?   ?Component Value Date/Time  ? PHART 7.43 06/14/2021 1504  ? HCO3 26.5 06/14/2021 1504  ? O2SAT 99.3 06/14/2021 1504  ? ?CBG (last 3)  ?No results for input(s): GLUCAP in the last 72 hours. ? ?Meds ?Scheduled Meds: ? acetaminophen  1,000 mg Oral Q6H  ? Or  ? acetaminophen (TYLENOL) oral liquid 160 mg/5 mL  1,000 mg Oral Q6H  ? albuterol  2.5 mg Nebulization BID  ? bisacodyl  10 mg Oral Daily  ? dextromethorphan-guaiFENesin  1 tablet Oral BID  ? enoxaparin (LOVENOX) injection  40 mg Subcutaneous Daily   ? levothyroxine  25 mcg Oral Daily  ? senna-docusate  1 tablet Oral QHS  ? traZODone  25 mg Oral QHS  ? ?Continuous Infusions: ?PRN Meds:.fentaNYL (SUBLIMAZE) injection, nicotine polacrilex, ondansetron (ZOFRAN) IV, traMADol ? ?Xrays ?DG Chest 1 View ? ?Result Date: 06/18/2021 ?CLINICAL DATA:  Right chest tube removal.  Chest pain. EXAM: CHEST  1 VIEW COMPARISON:  Radiographs 06/18/2021 and 06/17/2021.  CT 02/11/2021. FINDINGS: 1307 hours. Interval right chest tube removal. No evidence of pneumothorax. Lower lung volumes with increased opacities at both lung bases, likely reflecting atelectasis. The heart size and mediastinal contours are stable allowing for the lower lung volumes. Persistent soft tissue emphysema laterally in the lower right chest wall. No evidence of acute fracture. Telemetry leads overlie the chest. IMPRESSION: Lower lung volumes and probable bibasilar atelectasis following right chest removal. No evidence of pneumothorax. Electronically Signed   By: Richardean Sale M.D.   On: 06/18/2021 13:21  ? ?DG CHEST PORT 1 VIEW ? ?Result Date: 06/19/2021 ?CLINICAL DATA:  Reason for exam: surgery follow up, mediastinal mass Patient denies any sob or chest pains. Hx of XI ROBOTIC ASSISTED RESECTION OF MEDIASTINAL MASS right 06/17/2021. EXAM: PORTABLE CHEST - 1 VIEW COMPARISON:  the previous day's study FINDINGS: Relatively low lung volumes as before. Coarse bibasilar alveolar airspace  opacity slightly increased on the left, now obscuring portions of the left diaphragmatic leaflet. Heart size and mediastinal contours are within normal limits. No effusion. Visualized bones unremarkable. IMPRESSION: Low volumes.  Some worsening of left lower lung airspace opacities. Electronically Signed   By: Lucrezia Europe M.D.   On: 06/19/2021 07:08  ? ?DG Chest Port 1 View ? ?Result Date: 06/18/2021 ?CLINICAL DATA:  Provided history: Mediastinal mass. Shortness of breath. Chest pain. History of robotic assisted resection of  mediastinal mass. EXAM: PORTABLE CHEST 1 VIEW COMPARISON:  Prior chest radiographs 06/17/2021 and earlier. PET-CT 03/07/2021. MRI chest 03/06/2021. FINDINGS: Stable position of a right-sided chest tube terminating in the region of the mediastinum. Unchanged opacity along the medial aspect of the right upper lobe, which may reflect atelectasis or postoperative mediastinal fluid. Mild atelectasis within the medial right middle lobe and right lower lobe. No appreciable airspace consolidation within the left lung. No evidence of pleural effusion or pneumothorax. Mild subcutaneous emphysema along the right chest wall. IMPRESSION: Stable position of a right-sided chest tube terminating in the region of the mediastinum. Unchanged opacity along the medial aspect of the right upper lobe, which may reflect atelectasis or postoperative mediastinal fluid. Mild atelectasis within the medial right middle lobe and right lower lobe. Persistent mild subcutaneous emphysema within the right chest wall. Electronically Signed   By: Kellie Simmering D.O.   On: 06/18/2021 08:28  ? ?DG Chest Port 1 View ? ?Result Date: 06/17/2021 ?CLINICAL DATA:  Status post robotic assisted mediastinal mass resection. EXAM: PORTABLE CHEST 1 VIEW COMPARISON:  Radiographs 06/14/2021. CT 02/11/2021. PET-CT 03/07/2021. FINDINGS: 1040 hours. Right-sided chest tube tip projects over the carina. There are lower lung volumes with mild right chest wall soft tissue emphysema. No pneumothorax or significant pleural effusion identified. The lungs are clear. The heart size and mediastinal contours are stable for the degree of inspiration. IMPRESSION: Interval postsurgical changes as described with mild soft tissue emphysema in the right chest wall. No evidence of pneumothorax. Electronically Signed   By: Richardean Sale M.D.   On: 06/17/2021 11:10   ? ?Assessment/Plan: ?S/P Procedure(s) (LRB): ?XI ROBOTIC ASSISTED RESECTION OF MEDIASTINAL MASS (Right) ?INTERCOSTAL NERVE  BLOCK (Right) ?POD#2 ?1 afeb, VSS , sinus rhythm ?2 sats good on RA ?3 good UOP ?4 CXR some minor ASD L>R ?5 minor hyponatremia ?6 BUN/creat up a little but pretty close to baseline-encourage po fluids ?7 leukocytosis, trending down a little ?8 steady progress overall, he is stable for discharge- discussed instructions ?9 final path pending ? ? ? ? LOS: 2 days  ? ? ?John Giovanni PA-C ?Pager [phone] ? ?06/19/2021 ?  ?

## 2021-06-19 NOTE — TOC Initial Note (Signed)
Transition of Care (TOC) - Initial/Assessment Note  ? ? ?Patient Details  ?Name: ODILON CASS ?MRN: [id number] ?Date of Birth: 05/13/1955 ? ?Transition of Care (TOC) CM/SW Contact:    ?Angelita Ingles, RN ?Phone [phone] ? ?06/19/2021, 8:40 AM ? ?Clinical Narrative:                 ? ?Transition of Care (TOC) Screening Note ? ? ?Patient Details  ?Name: LEANDREW KEECH ?Date of Birth: 24-Nov-1955 ? ? ?Transition of Care (TOC) CM/SW Contact:    ?Angelita Ingles, RN ?Phone Number: ?06/19/2021, 8:40 AM ? ? ? ?Transition of Care Department [facility]) has reviewed patient and no TOC needs have been identified at this time. Patient is discharging. ? ? ? ?  ?  ? ? ?Patient Goals and CMS Choice ?  ?  ?  ? ?Expected Discharge Plan and Services ?  ?  ?  ?  ?  ?Expected Discharge Date: 06/19/21               ?  ?  ?  ?  ?  ?  ?  ?  ?  ?  ? ?Prior Living Arrangements/Services ?  ?  ?  ?       ?  ?  ?  ?  ? ?Activities of Daily Living ?  ?  ? ?Permission Sought/Granted ?  ?  ?   ?   ?   ?   ? ?Emotional Assessment ?  ?  ?  ?  ?  ?  ? ?Admission diagnosis:  Mediastinal mass [J98.59] ?Patient Active Problem List  ? Diagnosis Date Noted  ? Mediastinal mass 04/08/2021  ? Acquired hypothyroidism 11/16/2020  ? Mixed hyperlipidemia 11/16/2020  ? Elevated serum creatinine 11/16/2020  ? Heavy cigarette smoker 11/15/2020  ? Nocturia more than twice per night 11/15/2020  ? ?PCP:  Baruch Gouty, FNP ?Pharmacy:   ?[place] [address] ?Phone: [phone] Fax: [phone] ? ? ? ? ?Social Determinants of Health (SDOH) Interventions ?  ? ?Readmission Risk Interventions ?   ? View : No data to display.  ?  ?  ?  ? ? ? ?

## 2021-06-19 NOTE — Plan of Care (Signed)
  Problem: Education: Goal: Knowledge of disease or condition will improve Outcome: Adequate for Discharge Goal: Knowledge of the prescribed therapeutic regimen will improve Outcome: Adequate for Discharge   Problem: Activity: Goal: Risk for activity intolerance will decrease Outcome: Adequate for Discharge   Problem: Cardiac: Goal: Will achieve and/or maintain hemodynamic stability Outcome: Adequate for Discharge   Problem: Clinical Measurements: Goal: Postoperative complications will be avoided or minimized Outcome: Adequate for Discharge   Problem: Respiratory: Goal: Respiratory status will improve Outcome: Adequate for Discharge   Problem: Pain Management: Goal: Pain level will decrease Outcome: Adequate for Discharge   Problem: Skin Integrity: Goal: Wound healing without signs and symptoms infection will improve Outcome: Adequate for Discharge   Problem: Education: Goal: Knowledge of General Education information will improve Description: Including pain rating scale, medication(s)/side effects and non-pharmacologic comfort measures Outcome: Adequate for Discharge   Problem: Health Behavior/Discharge Planning: Goal: Ability to manage health-related needs will improve Outcome: Adequate for Discharge   Problem: Clinical Measurements: Goal: Ability to maintain clinical measurements within normal limits will improve Outcome: Adequate for Discharge Goal: Will remain free from infection Outcome: Adequate for Discharge Goal: Diagnostic test results will improve Outcome: Adequate for Discharge Goal: Respiratory complications will improve Outcome: Adequate for Discharge Goal: Cardiovascular complication will be avoided Outcome: Adequate for Discharge   Problem: Activity: Goal: Risk for activity intolerance will decrease Outcome: Adequate for Discharge   Problem: Nutrition: Goal: Adequate nutrition will be maintained Outcome: Adequate for Discharge   Problem:  Coping: Goal: Level of anxiety will decrease Outcome: Adequate for Discharge   Problem: Elimination: Goal: Will not experience complications related to bowel motility Outcome: Adequate for Discharge Goal: Will not experience complications related to urinary retention Outcome: Adequate for Discharge   Problem: Pain Managment: Goal: General experience of comfort will improve Outcome: Adequate for Discharge   Problem: Safety: Goal: Ability to remain free from injury will improve Outcome: Adequate for Discharge   Problem: Skin Integrity: Goal: Risk for impaired skin integrity will decrease Outcome: Adequate for Discharge   

## 2021-06-21 LAB — SURGICAL PATHOLOGY

## 2021-06-24 ENCOUNTER — Telehealth: Payer: Self-pay | Admitting: Family Medicine

## 2021-06-24 NOTE — Telephone Encounter (Signed)
Pt r/c.

## 2021-06-24 NOTE — Telephone Encounter (Signed)
LM to RC  

## 2021-06-24 NOTE — Telephone Encounter (Signed)
Patient wanted to go over lab results. Had questions regarding Lipids and Renal Function. Discussed with patient importance of adaquate nutrition and hydration. Patient verbalized understanding.

## 2021-06-26 ENCOUNTER — Other Ambulatory Visit: Payer: Self-pay | Admitting: Thoracic Surgery (Cardiothoracic Vascular Surgery)

## 2021-06-26 DIAGNOSIS — J9859 Other diseases of mediastinum, not elsewhere classified: Secondary | ICD-10-CM

## 2021-06-28 ENCOUNTER — Ambulatory Visit (INDEPENDENT_AMBULATORY_CARE_PROVIDER_SITE_OTHER): Payer: Self-pay | Admitting: Thoracic Surgery (Cardiothoracic Vascular Surgery)

## 2021-06-28 ENCOUNTER — Ambulatory Visit
Admission: RE | Admit: 2021-06-28 | Discharge: 2021-06-28 | Disposition: A | Discharge status: Home / Self Care | Payer: Medicare HMO | Source: Ambulatory Visit | Attending: Thoracic Surgery (Cardiothoracic Vascular Surgery) | Admitting: Thoracic Surgery (Cardiothoracic Vascular Surgery)

## 2021-06-28 ENCOUNTER — Telehealth: Payer: Self-pay | Admitting: Radiation Oncology

## 2021-06-28 VITALS — BP 137/95 | HR 102 | Resp 20 | Ht 71.0 in | Wt 172.0 lb

## 2021-06-28 DIAGNOSIS — J9859 Other diseases of mediastinum, not elsewhere classified: Secondary | ICD-10-CM

## 2021-06-28 DIAGNOSIS — Z9889 Other specified postprocedural states: Secondary | ICD-10-CM

## 2021-06-28 DIAGNOSIS — R918 Other nonspecific abnormal finding of lung field: Secondary | ICD-10-CM | POA: Diagnosis not present

## 2021-06-28 NOTE — Progress Notes (Signed)
      [address]       [place] [address]             [phone]        Antonio Scott [place] Medical Record #[id number] Date of Birth: 14-Feb-1955  Referring: Derek Jack, MD Primary Care: Baruch Gouty, FNP Primary Cardiologist:None  Reason for visit:   follow-up  History of Present Illness:     [name] comes in for his 1 week follow-up.  Overall he is doing well.  He has no complaints.  Physical Exam: BP (!) 137/95 (BP Location: Right Arm, Patient Position: Sitting)   Pulse (!) 102   Resp 20   Ht '5\' 11"'$  (1.803 m)   Wt 172 lb (78 kg)   SpO2 94% Comment: RA  BMI 23.99 kg/m   Alert NAD Incision clean.   Abdomen, ND No peripheral edema   Diagnostic Studies & Laboratory data:  Path: FINAL MICROSCOPIC DIAGNOSIS:   A. 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  The pericardium was inserted for complete resection.  Margins were all negative.  I have made him a referral to radiation oncology for further assessment.  We will follow-up in 1 month with a chest x-ray.   Antonio Scott 06/28/2021 4:23 PM

## 2021-06-28 NOTE — Telephone Encounter (Signed)
LVM to schedule consultation with Dr. Sondra Come

## 2021-07-02 ENCOUNTER — Telehealth: Payer: Self-pay | Admitting: Radiation Oncology

## 2021-07-02 NOTE — Telephone Encounter (Signed)
LVM to sched consult with Dr. Kinard °

## 2021-07-03 ENCOUNTER — Telehealth: Payer: Self-pay | Admitting: Radiation Oncology

## 2021-07-03 NOTE — Telephone Encounter (Signed)
LVM to schedule consultation with Dr. Sondra Come

## 2021-07-10 ENCOUNTER — Inpatient Hospital Stay (HOSPITAL_COMMUNITY): Payer: Medicare HMO | Attending: Hematology | Admitting: Hematology

## 2021-07-10 VITALS — BP 120/91 | HR 85 | Temp 97.5°F | Resp 18 | Ht 71.0 in | Wt 171.3 lb

## 2021-07-10 DIAGNOSIS — D4989 Neoplasm of unspecified behavior of other specified sites: Secondary | ICD-10-CM | POA: Diagnosis not present

## 2021-07-10 DIAGNOSIS — Z800 Family history of malignant neoplasm of digestive organs: Secondary | ICD-10-CM | POA: Diagnosis not present

## 2021-07-10 DIAGNOSIS — M25519 Pain in unspecified shoulder: Secondary | ICD-10-CM | POA: Diagnosis not present

## 2021-07-10 DIAGNOSIS — Z808 Family history of malignant neoplasm of other organs or systems: Secondary | ICD-10-CM | POA: Insufficient documentation

## 2021-07-10 DIAGNOSIS — F17210 Nicotine dependence, cigarettes, uncomplicated: Secondary | ICD-10-CM | POA: Insufficient documentation

## 2021-07-10 DIAGNOSIS — C37 Malignant neoplasm of thymus: Secondary | ICD-10-CM | POA: Diagnosis not present

## 2021-07-10 DIAGNOSIS — J9859 Other diseases of mediastinum, not elsewhere classified: Secondary | ICD-10-CM

## 2021-07-10 DIAGNOSIS — R69 Illness, unspecified: Secondary | ICD-10-CM | POA: Diagnosis not present

## 2021-07-10 NOTE — Progress Notes (Signed)
[name],  [address]   CLINIC:  Medical Oncology/Hematology  PCP:  Baruch Gouty, [place] / [place] [address] [phone]   REASON FOR VISIT:  Follow-up for thymoma  PRIOR THERAPY: Robotic assisted thoracoscopic resection of anterior mediastinal mass  NGS Results: not done  CURRENT THERAPY: PORT recommended.  BRIEF ONCOLOGIC HISTORY:  Oncology History   No history exists.    CANCER STAGING: Cancer Staging  Thymoma Staging form: Thymus, AJCC 8th Edition - Clinical stage from 07/10/2021: Stage II (cT2, cN0, cM0) - Unsigned   INTERVAL HISTORY:  Antonio Scott, a 66 y.o. male, returns for routine follow-up of his thymoma. Alyn was last seen on 04/08/2021.   Today he reports feeling good. He reports left sided scapula pain.   REVIEW OF SYSTEMS:  Review of Systems  Constitutional:  Negative for appetite change and fatigue.  Gastrointestinal:  Positive for abdominal pain (R side).  Musculoskeletal:  Positive for back pain (L upper).  Skin:  Positive for itching.  Psychiatric/Behavioral:  Positive for sleep disturbance.   All other systems reviewed and are negative.  PAST MEDICAL/SURGICAL HISTORY:  Past Medical History:  Diagnosis Date   Anxiety    Hypothyroidism    Past Surgical History:  Procedure Laterality Date   INTERCOSTAL NERVE BLOCK Right 06/17/2021   Procedure: INTERCOSTAL NERVE BLOCK;  Surgeon: Lajuana Matte, MD;  Location: [facility] OR;  Service: Thoracic;  Laterality: Right;   SKIN LESION EXCISION      SOCIAL HISTORY:  Social History   Socioeconomic History   Marital status: Single    Spouse name: Not on file   Number of children: 0   Years of education: Not on file   Highest education level: Not on file  Occupational History   Occupation: retired  Tobacco Use   Smoking status: Every Day    Packs/day: 0.20    Years: 50.00    Pack years: 10.00    Types: Cigarettes    Smokeless tobacco: Never   Tobacco comments:    Currently smoking 7-8 cig daily  Vaping Use   Vaping Use: Never used  Substance and Sexual Activity   Alcohol use: Not Currently   Drug use: Not Currently   Sexual activity: Not Currently  Other Topics Concern   Not on file  Social History Narrative   No children   Brother lives a mile away   Social Determinants of Health   Financial Resource Strain: Low Risk    Difficulty of Paying Living Expenses: Not hard at all  Food Insecurity: No Food Insecurity   Worried About Charity fundraiser in the Last Year: Never true   Arboriculturist in the Last Year: Never true  Transportation Needs: No Transportation Needs   Lack of Transportation (Medical): No   Lack of Transportation (Non-Medical): No  Physical Activity: Sufficiently Active   Days of Exercise per Week: 6 days   Minutes of Exercise per Session: 30 min  Stress: No Stress Concern Present   Feeling of Stress : Only a little  Social Connections: Socially Isolated   Frequency of Communication with Friends and Family: Twice a week   Frequency of Social Gatherings with Friends and Family: Once a week   Attends Religious Services: Never   Marine scientist or Organizations: No   Attends Archivist Meetings: Never   Marital Status: Never married  Intimate Partner Violence:  Not At Risk   Fear of Current or Ex-Partner: No   Emotionally Abused: No   Physically Abused: No   Sexually Abused: No    FAMILY HISTORY:  Family History  Problem Relation Age of Onset   Stroke Mother    Diabetes Mother    Cancer Father    Cancer Brother    Liver cancer Maternal Aunt    Colon cancer Paternal Grandfather     CURRENT MEDICATIONS:  Current Outpatient Medications  Medication Sig Dispense Refill   levothyroxine (SYNTHROID) 25 MCG tablet Take 1 tablet (25 mcg total) by mouth daily. 90 tablet 3   neomycin-bacitracin-polymyxin (NEOSPORIN) ointment Apply 1 application.  topically 2 (two) times daily as needed for wound care.     nicotine polacrilex (COMMIT) 2 MG lozenge Take 2 mg by mouth as needed for smoking cessation.     SHINGRIX injection      No current facility-administered medications for this visit.    ALLERGIES:  No Known Allergies  PHYSICAL EXAM:  Performance status (ECOG): 1 - Symptomatic but completely ambulatory  Vitals:   07/10/21 1304  BP: (!) 120/91  Pulse: 85  Resp: 18  Temp: (!) 97.5 F (36.4 C)  SpO2: 100%   Wt Readings from Last 3 Encounters:  07/10/21 171 lb 4.8 oz (77.7 kg)  06/28/21 172 lb (78 kg)  06/17/21 174 lb 3.2 oz (79 kg)   Physical Exam Vitals reviewed.  Constitutional:      Appearance: Normal appearance.  Cardiovascular:     Rate and Rhythm: Normal rate and regular rhythm.     Pulses: Normal pulses.     Heart sounds: Normal heart sounds.  Pulmonary:     Effort: Pulmonary effort is normal.     Breath sounds: Normal breath sounds.  Neurological:     General: No focal deficit present.     Mental Status: He is alert and oriented to person, place, and time.  Psychiatric:        Mood and Affect: Mood normal.        Behavior: Behavior normal.     LABORATORY DATA:  I have reviewed the labs as listed.     Latest Ref Rng & Units 06/19/2021   12:56 AM 06/18/2021    2:36 AM 06/14/2021    3:00 PM  CBC  WBC 4.0 - 10.5 K/uL 19.0   20.3   5.8    Hemoglobin 13.0 - 17.0 g/dL 14.1   13.4   14.4    Hematocrit 39.0 - 52.0 % 43.2   40.9   43.0    Platelets 150 - 400 K/uL 142   137   167        Latest Ref Rng & Units 06/19/2021   12:56 AM 06/18/2021    2:36 AM 06/14/2021    3:00 PM  CMP  Glucose 70 - 99 mg/dL 104   141   100    BUN 8 - 23 mg/dL '26   16   16    '$ Creatinine 0.61 - 1.24 mg/dL 1.82   1.78   1.74    Sodium 135 - 145 mmol/L 133   136   137    Potassium 3.5 - 5.1 mmol/L 3.9   4.1   5.1    Chloride 98 - 111 mmol/L 99   100   108    CO2 22 - 32 mmol/L '26   27   22    '$ Calcium 8.9 - 10.3 mg/dL  9.2    9.2   9.2    Total Protein 6.5 - 8.1 g/dL 6.6    6.9    Total Bilirubin 0.3 - 1.2 mg/dL 0.4    0.6    Alkaline Phos 38 - 126 U/L 66    70    AST 15 - 41 U/L 35    29    ALT 0 - 44 U/L 22    22      DIAGNOSTIC IMAGING:  I have independently reviewed the scans and discussed with the patient. DG Chest 1 View  Result Date: 06/18/2021 CLINICAL DATA:  Right chest tube removal.  Chest pain. EXAM: CHEST  1 VIEW COMPARISON:  Radiographs 06/18/2021 and 06/17/2021.  CT 02/11/2021. FINDINGS: 1307 hours. Interval right chest tube removal. No evidence of pneumothorax. Lower lung volumes with increased opacities at both lung bases, likely reflecting atelectasis. The heart size and mediastinal contours are stable allowing for the lower lung volumes. Persistent soft tissue emphysema laterally in the lower right chest wall. No evidence of acute fracture. Telemetry leads overlie the chest. IMPRESSION: Lower lung volumes and probable bibasilar atelectasis following right chest removal. No evidence of pneumothorax. Electronically Signed   By: Richardean Sale M.D.   On: 06/18/2021 13:21   DG Chest 2 View  Result Date: 06/28/2021 CLINICAL DATA:  mediastinal mass EXAM: CHEST - 2 VIEW COMPARISON:  Chest radiograph April 19, 2021. FINDINGS: Known mediastinal mass not well characterized by radiograph. Cardiomediastinal silhouette appears similar to prior. Improved aeration the left lung base with mild residual left basilar opacities. Possible trace bilateral pleural effusions. No visible pneumothorax. Biapical pleuroparenchymal scarring. IMPRESSION: 1. Improved aeration of the left lung base with mild residual left basilar opacities. 2. Possible trace bilateral pleural effusions. 3. Known mediastinal mass not well characterized by radiograph. Chest CT could better evaluate if clinically warranted. Electronically Signed   By: Margaretha Sheffield M.D.   On: 06/28/2021 11:27   DG Chest 2 View  Result Date: 06/17/2021 CLINICAL  DATA:  Anterior mediastinal mass.  Preoperative evaluation. EXAM: CHEST - 2 VIEW COMPARISON:  CT 02/11/2021.  PET-CT 03/07/2021. FINDINGS: The heart size and mediastinal contours are stable. Retrosternal density on the lateral view likely corresponds with the patient's known anterior mediastinal mass. The lungs appear clear. There is no pleural effusion or pneumothorax. No acute osseous findings are identified. IMPRESSION: Grossly stable anterior mediastinal mass. No acute cardiopulmonary process. Electronically Signed   By: Richardean Sale M.D.   On: 06/17/2021 11:12   DG CHEST PORT 1 VIEW  Result Date: 06/19/2021 CLINICAL DATA:  Reason for exam: surgery follow up, mediastinal mass Patient denies any sob or chest pains. Hx of XI ROBOTIC ASSISTED RESECTION OF MEDIASTINAL MASS right 06/17/2021. EXAM: PORTABLE CHEST - 1 VIEW COMPARISON:  the previous day's study FINDINGS: Relatively low lung volumes as before. Coarse bibasilar alveolar airspace opacity slightly increased on the left, now obscuring portions of the left diaphragmatic leaflet. Heart size and mediastinal contours are within normal limits. No effusion. Visualized bones unremarkable. IMPRESSION: Low volumes.  Some worsening of left lower lung airspace opacities. Electronically Signed   By: Lucrezia Europe M.D.   On: 06/19/2021 07:08   DG Chest Port 1 View  Result Date: 06/18/2021 CLINICAL DATA:  Provided history: Mediastinal mass. Shortness of breath. Chest pain. History of robotic assisted resection of mediastinal mass. EXAM: PORTABLE CHEST 1 VIEW COMPARISON:  Prior chest radiographs 06/17/2021 and earlier. PET-CT 03/07/2021. MRI chest 03/06/2021. FINDINGS: Stable position  of a right-sided chest tube terminating in the region of the mediastinum. Unchanged opacity along the medial aspect of the right upper lobe, which may reflect atelectasis or postoperative mediastinal fluid. Mild atelectasis within the medial right middle lobe and right lower lobe. No  appreciable airspace consolidation within the left lung. No evidence of pleural effusion or pneumothorax. Mild subcutaneous emphysema along the right chest wall. IMPRESSION: Stable position of a right-sided chest tube terminating in the region of the mediastinum. Unchanged opacity along the medial aspect of the right upper lobe, which may reflect atelectasis or postoperative mediastinal fluid. Mild atelectasis within the medial right middle lobe and right lower lobe. Persistent mild subcutaneous emphysema within the right chest wall. Electronically Signed   By: Kellie Simmering D.O.   On: 06/18/2021 08:28   DG Chest Port 1 View  Result Date: 06/17/2021 CLINICAL DATA:  Status post robotic assisted mediastinal mass resection. EXAM: PORTABLE CHEST 1 VIEW COMPARISON:  Radiographs 06/14/2021. CT 02/11/2021. PET-CT 03/07/2021. FINDINGS: 1040 hours. Right-sided chest tube tip projects over the carina. There are lower lung volumes with mild right chest wall soft tissue emphysema. No pneumothorax or significant pleural effusion identified. The lungs are clear. The heart size and mediastinal contours are stable for the degree of inspiration. IMPRESSION: Interval postsurgical changes as described with mild soft tissue emphysema in the right chest wall. No evidence of pneumothorax. Electronically Signed   By: Richardean Sale M.D.   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  No enlarged lymph nodes. - MRI of the chest with and without contrast on 03/06/2021: Heterogeneous, lobulated solid mass in the anterior mediastinum with faint contrast-enhancement measuring 4.4 x 2.8 cm.  Small areas of cystic change internally.  No enlarged mediastinal, hilar or axillary lymph nodes. - PET scan on 03/07/2021: Macrolobulated solid anterior mediastinal mass is hypermetabolic with  SUV 9.4.  Thyroid is diffusely hypermetabolic without dominant mass with SUV 9.4.  Hypermetabolism anterior to the larynx could be muscular or related to atypical location of the node in this region including 6 mm SUV 3.9. - He was evaluated by Dr. Kipp Brood.  Tumor markers show normal LDH and beta-hCG.  AFP was minimally high at 6.3 (less than 6.1). - No B symptoms. - Robotic assisted thoracoscopic resection of anterior mediastinal mass on 06/17/2021. - Pathology: 5.6 cm type B1 thymoma.  Margins negative for tumor.  Carcinoma appears to involve pericardium.    Social/family history: - He is a retired Engineer, structural. - Current active smoker, smoked 1 pack/day for 50 years, now smoking 2 cigarettes/day since last 1 month. - Brother died of thymic carcinoma.  Another brother had melanoma.  Maternal aunt had liver cancer.  Maternal grandmother had breast cancer.  Maternal grandfather had pancreatic cancer.  Paternal grandfather had colon cancer.   PLAN:  Stage II/ Masaoka stage III (T2Nx M0) thymoma: - I have reviewed pathology report with the patient in detail. - Given the high risk of local recurrence, I have strongly recommended postoperative radiation therapy (PORT).  Data regarding chemotherapy is limited in this setting. - I will schedule him for a CT of the chest in 4 months.  After that he will receive a CT scan every 6 months for 2 years, then annually for 5-10 years.  We have also discussed surveillance plan in  detail.   Orders placed this encounter:  No orders of the defined types were placed in this encounter.    Derek Jack, MD [place] [phone]   I, Thana Ates, am acting as a scribe for Dr. Derek Jack.  I, Derek Jack MD, have reviewed the above documentation for accuracy and completeness, and I agree with the above.

## 2021-07-10 NOTE — Patient Instructions (Addendum)
[place] at [facility] Discharge Instructions   You were seen and examined today by Dr. Delton Coombes.  See the radiation oncologist as scheduled to discuss radiation treatment. Dr. Raliegh Ip highly recommends you proceed with radiation therapy to prevent recurrence of this cancer, especially since the tumor involved the lining of your heart.   Return as scheduled.      Thank you for choosing [place] at [facility] to provide your oncology and hematology care.  To afford each patient quality time with our provider, please arrive at least 15 minutes before your scheduled appointment time.   If you have a lab appointment with the [facility] please come in thru the Main Entrance and check in at the main information desk.  You need to re-schedule your appointment should you arrive 10 or more minutes late.  We strive to give you quality time with our providers, and arriving late affects you and other patients whose appointments are after yours.  Also, if you no show three or more times for appointments you may be dismissed from the clinic at the providers discretion.     Again, thank you for choosing [facility].  Our hope is that these requests will decrease the amount of time that you wait before being seen by our physicians.       _____________________________________________________________  Should you have questions after your visit to [facility], please contact our office at [phone] and follow the prompts.  Our office hours are 8:00 a.m. and 4:30 p.m. Monday - Friday.  Please note that voicemails left after 4:00 p.m. may not be returned until the following business day.  We are closed weekends and major holidays.  You do have access to a nurse 24-7, just call the main number to the clinic [phone] and do not press any options, hold on the line and a nurse will answer the phone.    For prescription refill  requests, have your pharmacy contact our office and allow 72 hours.    Due to Covid, you will need to wear a mask upon entering the hospital. If you do not have a mask, a mask will be given to you at the Main Entrance upon arrival. For doctor visits, patients may have 1 support person age 47 or older with them. For treatment visits, patients can not have anyone with them due to social distancing guidelines and our immunocompromised population.

## 2021-07-12 NOTE — Progress Notes (Signed)
Thoracic Location of Tumor / Histology:  Type B1 thymoma  Biopsies of revealed:   FINAL MICROSCOPIC DIAGNOSIS:   A. MEDIASTINAL MASS, ANTERIOR, RESECTION:  - Type B1 thymoma, 5.6 cm, see comment   - Thymoma partially involves the adherent pericardium  - Resection margins are negative for tumor  - See oncology table   Tobacco/Marijuana/Snuff/ETOH use: {:18581}  Past/Anticipated interventions by cardiothoracic surgery, if any: Robotic assisted right video thoracoscopy, resection of anterior mediastinal mass, intercostal nerve block 06/17/2021 by Dr. Kipp Brood  Past/Anticipated interventions by medical oncology, if any: {:18581}  Signs/Symptoms Weight changes, if any: {:18581} Respiratory complaints, if any: {:18581} Hemoptysis, if any: {:18581} Pain issues, if any:  {:18581}  SAFETY ISSUES: Prior radiation?  Pacemaker/ICD? {:18581}  Possible current pregnancy?no Is the patient on methotrexate? {:18581}  Current Complaints / other details:  ***

## 2021-07-16 NOTE — Progress Notes (Signed)
Radiation Oncology         [phone] ________________________________  Initial Outpatient Consultation  Name: Antonio Scott MRN: [id number]  Date: 07/17/2021  DOB: 1955-02-27  LE:XNTZG, Connye Burkitt, FNP  Lajuana Matte, MD   REFERRING PHYSICIAN: Lajuana Matte, MD  DIAGNOSIS: There were no encounter diagnoses.  Type B1 thymoma - mediastinal mass  HISTORY OF PRESENT ILLNESS::Antonio Scott is a 66 y.o. male who is accompanied by ***. he is seen as a courtesy of Dr. Kipp Brood for an opinion concerning radiation therapy as part of management for his recently diagnosed mediastinal thymoma.  The patient has a 50 pack year smoking history and participates in the lung cancer screening program.  The patient presented for a lung cancer screening CT on 02/11/21 which revealed a lobulated anterior mediastinal mass measuring up to 5.4 cm in the craniocaudal dimension, concerning for thymic epithelial neoplasm. Linear nodular opacities were also appreciated in the left lower lobe which appeared to be attributed to scarring from a prior infection.  Accordingly, the patient met with pulmonology, Eric Form NP, on 02/20/21, who recommended further workup consisting of MRI and PET imaging for further classification.   MRI of the chest on 03/06/21 redemonstrated the heterogeneous, lobulated solid mass in the anterior mediastinum with faint contrast enhancement, measuring 4.4 x 2.8 cm, consistent with thymic epithelial neoplasm, and worrisome for invasive thymoma or thymic carcinoma given size. No pathologically enlarged lymph nodes were appreciated in the remainder of the chest.  PET on 03/07/21 showed a moderate hypermetabolism (SUV 9.4) corresponding to the anterior mediastinal mass; with differential considerations including thymic neoplasm or lymphoma. PET also showed: a diffusely hypermetabolic thyroid ( SUV 9.4), but without a dominant mass, and muscular activity versus an atypically  positioned mildly hypermetabolic node within the fat anterior to the larynx (nonspecific). (PET also showed infrarenal abdominal aortic dilatation at maximally 3.6 cm).  Subsequently, the patient was referred to Dr. Kipp Brood, Thoracic surgery, on 03/08/21 for further management. During this visit, the patient endorsed some chest fullness and occasional sharp chest pain without exertion. Dr. Kipp Brood ordered tumor markers which showed AFP elevated to 6.3 (slightly over the upper limit of normal), Normal LDH at 230, and normal beta-hCG. The patient was also referred to Dr. Delton Coombes on 04/08/21. During which time, the patient reported a cough productive of clear and occasional brown sputum which increased over the course of 1 year, and occasional mild chest pain. In terms of treatment options, Dr. Delton Coombes recommended complete resectioning with Dr. Kipp Brood. Dr. Delton Coombes also ordered repeat AFP which returned with near normal results.   The patient opted to proceed with resectioning of the anterior mediastinal mass on 06/17/21 under the care of Dr. Kipp Brood. Final pathology showed Type B1 thymoma measuring 5.6 cm, with partial involvement of the adherent pericardium by the mass. All margins negative.   Of note: family history significant for a brother who died of thymic carcinoma.   PREVIOUS RADIATION THERAPY: No  PAST MEDICAL HISTORY:  Past Medical History:  Diagnosis Date   Anxiety    Hypothyroidism     PAST SURGICAL HISTORY: Past Surgical History:  Procedure Laterality Date   INTERCOSTAL NERVE BLOCK Right 06/17/2021   Procedure: INTERCOSTAL NERVE BLOCK;  Surgeon: Lajuana Matte, MD;  Location: [facility];  Service: Thoracic;  Laterality: Right;   SKIN LESION EXCISION      FAMILY HISTORY:  Family History  Problem Relation Age of Onset   Stroke Mother  Diabetes Mother    Cancer Father    Cancer Brother    Liver cancer Maternal Aunt    Colon cancer Paternal Grandfather      SOCIAL HISTORY:  Social History   Tobacco Use   Smoking status: Every Day    Packs/day: 0.20    Years: 50.00    Total pack years: 10.00    Types: Cigarettes   Smokeless tobacco: Never   Tobacco comments:    Currently smoking 7-8 cig daily  Vaping Use   Vaping Use: Never used  Substance Use Topics   Alcohol use: Not Currently   Drug use: Not Currently    ALLERGIES: No Known Allergies  MEDICATIONS:  Current Outpatient Medications  Medication Sig Dispense Refill   levothyroxine (SYNTHROID) 25 MCG tablet Take 1 tablet (25 mcg total) by mouth daily. 90 tablet 3   neomycin-bacitracin-polymyxin (NEOSPORIN) ointment Apply 1 application. topically 2 (two) times daily as needed for wound care.     nicotine polacrilex (COMMIT) 2 MG lozenge Take 2 mg by mouth as needed for smoking cessation.     SHINGRIX injection      No current facility-administered medications for this encounter.    REVIEW OF SYSTEMS:  A 10+ POINT REVIEW OF SYSTEMS WAS OBTAINED including neurology, dermatology, psychiatry, cardiac, respiratory, lymph, extremities, GI, GU, musculoskeletal, constitutional, reproductive, HEENT. ***   PHYSICAL EXAM:  vitals were not taken for this visit.   General: Alert and oriented, in no acute distress HEENT: Head is normocephalic. Extraocular movements are intact. Oropharynx is clear. Neck: Neck is supple, no palpable cervical or supraclavicular lymphadenopathy. Heart: Regular in rate and rhythm with no murmurs, rubs, or gallops. Chest: Clear to auscultation bilaterally, with no rhonchi, wheezes, or rales. Abdomen: Soft, nontender, nondistended, with no rigidity or guarding. Extremities: No cyanosis or edema. Lymphatics: see Neck Exam Skin: No concerning lesions. Musculoskeletal: symmetric strength and muscle tone throughout. Neurologic: Cranial nerves II through XII are grossly intact. No obvious focalities. Speech is fluent. Coordination is intact. Psychiatric: Judgment  and insight are intact. Affect is appropriate. ***  ECOG = ***  0 - Asymptomatic (Fully active, able to carry on all predisease activities without restriction)  1 - Symptomatic but completely ambulatory (Restricted in physically strenuous activity but ambulatory and able to carry out work of a light or sedentary nature. For example, light housework, office work)  2 - Symptomatic, <50% in bed during the day (Ambulatory and capable of all self care but unable to carry out any work activities. Up and about more than 50% of waking hours)  3 - Symptomatic, >50% in bed, but not bedbound (Capable of only limited self-care, confined to bed or chair 50% or more of waking hours)  4 - Bedbound (Completely disabled. Cannot carry on any self-care. Totally confined to bed or chair)  5 - Death   Eustace Pen MM, Creech RH, Tormey DC, et al. [phone]). "Toxicity and response criteria of the [facility]". [place] Oncol. 5 (6): 649-55  LABORATORY DATA:  Lab Results  Component Value Date   WBC 19.0 (H) 06/19/2021   HGB 14.1 06/19/2021   HCT 43.2 06/19/2021   MCV 87.4 06/19/2021   PLT 142 (L) 06/19/2021   NEUTROABS 4.0 11/15/2020   Lab Results  Component Value Date   NA 133 (L) 06/19/2021   K 3.9 06/19/2021   CL 99 06/19/2021   CO2 26 06/19/2021   GLUCOSE 104 (H) 06/19/2021   BUN 26 (H) 06/19/2021  CREATININE 1.82 (H) 06/19/2021   CALCIUM 9.2 06/19/2021      RADIOGRAPHY: DG Chest 2 View  Result Date: 06/28/2021 CLINICAL DATA:  mediastinal mass EXAM: CHEST - 2 VIEW COMPARISON:  Chest radiograph April 19, 2021. FINDINGS: Known mediastinal mass not well characterized by radiograph. Cardiomediastinal silhouette appears similar to prior. Improved aeration the left lung base with mild residual left basilar opacities. Possible trace bilateral pleural effusions. No visible pneumothorax. Biapical pleuroparenchymal scarring. IMPRESSION: 1. Improved aeration of the left lung base with  mild residual left basilar opacities. 2. Possible trace bilateral pleural effusions. 3. Known mediastinal mass not well characterized by radiograph. Chest CT could better evaluate if clinically warranted. Electronically Signed   By: Margaretha Sheffield M.D.   On: 06/28/2021 11:27   DG CHEST PORT 1 VIEW  Result Date: 06/19/2021 CLINICAL DATA:  Reason for exam: surgery follow up, mediastinal mass Patient denies any sob or chest pains. Hx of XI ROBOTIC ASSISTED RESECTION OF MEDIASTINAL MASS right 06/17/2021. EXAM: PORTABLE CHEST - 1 VIEW COMPARISON:  the previous day's study FINDINGS: Relatively low lung volumes as before. Coarse bibasilar alveolar airspace opacity slightly increased on the left, now obscuring portions of the left diaphragmatic leaflet. Heart size and mediastinal contours are within normal limits. No effusion. Visualized bones unremarkable. IMPRESSION: Low volumes.  Some worsening of left lower lung airspace opacities. Electronically Signed   By: Lucrezia Europe M.D.   On: 06/19/2021 07:08   DG Chest 1 View  Result Date: 06/18/2021 CLINICAL DATA:  Right chest tube removal.  Chest pain. EXAM: CHEST  1 VIEW COMPARISON:  Radiographs 06/18/2021 and 06/17/2021.  CT 02/11/2021. FINDINGS: 1307 hours. Interval right chest tube removal. No evidence of pneumothorax. Lower lung volumes with increased opacities at both lung bases, likely reflecting atelectasis. The heart size and mediastinal contours are stable allowing for the lower lung volumes. Persistent soft tissue emphysema laterally in the lower right chest wall. No evidence of acute fracture. Telemetry leads overlie the chest. IMPRESSION: Lower lung volumes and probable bibasilar atelectasis following right chest removal. No evidence of pneumothorax. Electronically Signed   By: Richardean Sale M.D.   On: 06/18/2021 13:21   DG Chest Port 1 View  Result Date: 06/18/2021 CLINICAL DATA:  Provided history: Mediastinal mass. Shortness of breath. Chest pain.  History of robotic assisted resection of mediastinal mass. EXAM: PORTABLE CHEST 1 VIEW COMPARISON:  Prior chest radiographs 06/17/2021 and earlier. PET-CT 03/07/2021. MRI chest 03/06/2021. FINDINGS: Stable position of a right-sided chest tube terminating in the region of the mediastinum. Unchanged opacity along the medial aspect of the right upper lobe, which may reflect atelectasis or postoperative mediastinal fluid. Mild atelectasis within the medial right middle lobe and right lower lobe. No appreciable airspace consolidation within the left lung. No evidence of pleural effusion or pneumothorax. Mild subcutaneous emphysema along the right chest wall. IMPRESSION: Stable position of a right-sided chest tube terminating in the region of the mediastinum. Unchanged opacity along the medial aspect of the right upper lobe, which may reflect atelectasis or postoperative mediastinal fluid. Mild atelectasis within the medial right middle lobe and right lower lobe. Persistent mild subcutaneous emphysema within the right chest wall. Electronically Signed   By: Kellie Simmering D.O.   On: 06/18/2021 08:28   DG Chest Port 1 View  Result Date: 06/17/2021 CLINICAL DATA:  Status post robotic assisted mediastinal mass resection. EXAM: PORTABLE CHEST 1 VIEW COMPARISON:  Radiographs 06/14/2021. CT 02/11/2021. PET-CT 03/07/2021. FINDINGS: 1040 hours. Right-sided chest tube  tip projects over the carina. There are lower lung volumes with mild right chest wall soft tissue emphysema. No pneumothorax or significant pleural effusion identified. The lungs are clear. The heart size and mediastinal contours are stable for the degree of inspiration. IMPRESSION: Interval postsurgical changes as described with mild soft tissue emphysema in the right chest wall. No evidence of pneumothorax. Electronically Signed   By: Richardean Sale M.D.   On: 06/17/2021 11:10      IMPRESSION: Type B1 thymoma - mediastinal mass  ***  Today, I talked to the  patient and family about the findings and work-up thus far.  We discussed the natural history of *** and general treatment, highlighting the role of radiotherapy in the management.  We discussed the available radiation techniques, and focused on the details of logistics and delivery.  We reviewed the anticipated acute and late sequelae associated with radiation in this setting.  The patient was encouraged to ask questions that I answered to the best of my ability. *** A patient consent form was discussed and signed.  We retained a copy for our records.  The patient would like to proceed with radiation and will be scheduled for CT simulation.  PLAN: ***    *** minutes of total time was spent for this patient encounter, including preparation, face-to-face counseling with the patient and coordination of care, physical exam, and documentation of the encounter.   ------------------------------------------------  Blair Promise, PhD, MD  This document serves as a record of services personally performed by Gery Pray, MD. It was created on his behalf by Roney Mans, a trained medical scribe. The creation of this record is based on the scribe's personal observations and the provider's statements to them. This document has been checked and approved by the attending provider.

## 2021-07-17 ENCOUNTER — Ambulatory Visit
Admission: RE | Admit: 2021-07-17 | Discharge: 2021-07-17 | Disposition: A | Discharge status: Home / Self Care | Payer: Medicare HMO | Source: Ambulatory Visit | Attending: Radiation Oncology | Admitting: Radiation Oncology

## 2021-07-17 ENCOUNTER — Encounter: Payer: Self-pay | Admitting: Radiation Oncology

## 2021-07-17 ENCOUNTER — Other Ambulatory Visit: Payer: Self-pay

## 2021-07-17 VITALS — BP 125/85 | HR 92 | Temp 97.2°F | Resp 18 | Ht 71.0 in | Wt 162.4 lb

## 2021-07-17 DIAGNOSIS — Z800 Family history of malignant neoplasm of digestive organs: Secondary | ICD-10-CM | POA: Insufficient documentation

## 2021-07-17 DIAGNOSIS — D4989 Neoplasm of unspecified behavior of other specified sites: Secondary | ICD-10-CM

## 2021-07-17 DIAGNOSIS — E039 Hypothyroidism, unspecified: Secondary | ICD-10-CM | POA: Insufficient documentation

## 2021-07-17 DIAGNOSIS — C37 Malignant neoplasm of thymus: Secondary | ICD-10-CM | POA: Insufficient documentation

## 2021-07-17 DIAGNOSIS — Z808 Family history of malignant neoplasm of other organs or systems: Secondary | ICD-10-CM | POA: Diagnosis not present

## 2021-07-17 DIAGNOSIS — Z79899 Other long term (current) drug therapy: Secondary | ICD-10-CM | POA: Diagnosis not present

## 2021-07-17 DIAGNOSIS — F17210 Nicotine dependence, cigarettes, uncomplicated: Secondary | ICD-10-CM | POA: Diagnosis not present

## 2021-07-17 DIAGNOSIS — R079 Chest pain, unspecified: Secondary | ICD-10-CM | POA: Insufficient documentation

## 2021-07-17 DIAGNOSIS — R69 Illness, unspecified: Secondary | ICD-10-CM | POA: Diagnosis not present

## 2021-07-23 ENCOUNTER — Other Ambulatory Visit: Payer: Self-pay

## 2021-07-23 ENCOUNTER — Ambulatory Visit
Admission: RE | Admit: 2021-07-23 | Discharge: 2021-07-23 | Disposition: A | Discharge status: Home / Self Care | Payer: Medicare HMO | Source: Ambulatory Visit | Attending: Radiation Oncology | Admitting: Radiation Oncology

## 2021-07-23 DIAGNOSIS — Z510 Encounter for antineoplastic radiation therapy: Secondary | ICD-10-CM | POA: Diagnosis not present

## 2021-07-23 DIAGNOSIS — C37 Malignant neoplasm of thymus: Secondary | ICD-10-CM | POA: Insufficient documentation

## 2021-07-23 DIAGNOSIS — D4989 Neoplasm of unspecified behavior of other specified sites: Secondary | ICD-10-CM

## 2021-07-25 ENCOUNTER — Other Ambulatory Visit: Payer: Self-pay | Admitting: Thoracic Surgery (Cardiothoracic Vascular Surgery)

## 2021-07-25 DIAGNOSIS — Z510 Encounter for antineoplastic radiation therapy: Secondary | ICD-10-CM | POA: Diagnosis not present

## 2021-07-25 DIAGNOSIS — C37 Malignant neoplasm of thymus: Secondary | ICD-10-CM | POA: Diagnosis not present

## 2021-07-25 DIAGNOSIS — J9859 Other diseases of mediastinum, not elsewhere classified: Secondary | ICD-10-CM

## 2021-07-26 ENCOUNTER — Ambulatory Visit
Admission: RE | Admit: 2021-07-26 | Discharge: 2021-07-26 | Disposition: A | Discharge status: Home / Self Care | Payer: Medicare HMO | Source: Ambulatory Visit | Attending: Thoracic Surgery (Cardiothoracic Vascular Surgery) | Admitting: Thoracic Surgery (Cardiothoracic Vascular Surgery)

## 2021-07-26 ENCOUNTER — Ambulatory Visit (INDEPENDENT_AMBULATORY_CARE_PROVIDER_SITE_OTHER): Payer: Self-pay | Admitting: Thoracic Surgery (Cardiothoracic Vascular Surgery)

## 2021-07-26 VITALS — BP 125/86 | HR 82 | Resp 20 | Ht 71.0 in | Wt 163.0 lb

## 2021-07-26 DIAGNOSIS — Z9889 Other specified postprocedural states: Secondary | ICD-10-CM

## 2021-07-26 DIAGNOSIS — R222 Localized swelling, mass and lump, trunk: Secondary | ICD-10-CM | POA: Diagnosis not present

## 2021-07-26 DIAGNOSIS — J9859 Other diseases of mediastinum, not elsewhere classified: Secondary | ICD-10-CM

## 2021-07-26 DIAGNOSIS — Z8709 Personal history of other diseases of the respiratory system: Secondary | ICD-10-CM | POA: Diagnosis not present

## 2021-08-01 ENCOUNTER — Ambulatory Visit
Admission: RE | Admit: 2021-08-01 | Discharge: 2021-08-01 | Disposition: A | Discharge status: Home / Self Care | Payer: Medicare HMO | Source: Ambulatory Visit | Attending: Radiation Oncology | Admitting: Radiation Oncology

## 2021-08-01 ENCOUNTER — Other Ambulatory Visit: Payer: Self-pay

## 2021-08-01 DIAGNOSIS — Z510 Encounter for antineoplastic radiation therapy: Secondary | ICD-10-CM | POA: Diagnosis not present

## 2021-08-01 DIAGNOSIS — C37 Malignant neoplasm of thymus: Secondary | ICD-10-CM | POA: Diagnosis not present

## 2021-08-01 DIAGNOSIS — D4989 Neoplasm of unspecified behavior of other specified sites: Secondary | ICD-10-CM

## 2021-08-01 LAB — RAD ONC ARIA SESSION SUMMARY
Course Elapsed Days: 0
Plan Fractions Treated to Date: 1
Plan Prescribed Dose Per Fraction: 1.8 Gy
Plan Total Fractions Prescribed: 28
Plan Total Prescribed Dose: 50.4 Gy
Reference Point Dosage Given to Date: 1.8 Gy
Reference Point Session Dosage Given: 1.8 Gy
Session Number: 1

## 2021-08-02 ENCOUNTER — Ambulatory Visit
Admission: RE | Admit: 2021-08-02 | Discharge: 2021-08-02 | Disposition: A | Discharge status: Home / Self Care | Payer: Medicare HMO | Source: Ambulatory Visit | Attending: Radiation Oncology | Admitting: Radiation Oncology

## 2021-08-02 ENCOUNTER — Other Ambulatory Visit: Payer: Self-pay

## 2021-08-02 DIAGNOSIS — Z510 Encounter for antineoplastic radiation therapy: Secondary | ICD-10-CM | POA: Diagnosis not present

## 2021-08-02 DIAGNOSIS — C37 Malignant neoplasm of thymus: Secondary | ICD-10-CM | POA: Diagnosis not present

## 2021-08-02 LAB — RAD ONC ARIA SESSION SUMMARY
Course Elapsed Days: 1
Plan Fractions Treated to Date: 2
Plan Prescribed Dose Per Fraction: 1.8 Gy
Plan Total Fractions Prescribed: 28
Plan Total Prescribed Dose: 50.4 Gy
Reference Point Dosage Given to Date: 3.6 Gy
Reference Point Session Dosage Given: 1.8 Gy
Session Number: 2

## 2021-08-05 ENCOUNTER — Other Ambulatory Visit: Payer: Self-pay

## 2021-08-05 ENCOUNTER — Ambulatory Visit
Admission: RE | Admit: 2021-08-05 | Discharge: 2021-08-05 | Disposition: A | Discharge status: Home / Self Care | Payer: Medicare HMO | Source: Ambulatory Visit | Attending: Radiation Oncology | Admitting: Radiation Oncology

## 2021-08-05 DIAGNOSIS — D4989 Neoplasm of unspecified behavior of other specified sites: Secondary | ICD-10-CM | POA: Diagnosis not present

## 2021-08-05 DIAGNOSIS — Z510 Encounter for antineoplastic radiation therapy: Secondary | ICD-10-CM | POA: Diagnosis not present

## 2021-08-05 DIAGNOSIS — C37 Malignant neoplasm of thymus: Secondary | ICD-10-CM | POA: Diagnosis not present

## 2021-08-05 LAB — RAD ONC ARIA SESSION SUMMARY
Course Elapsed Days: 4
Plan Fractions Treated to Date: 3
Plan Prescribed Dose Per Fraction: 1.8 Gy
Plan Total Fractions Prescribed: 28
Plan Total Prescribed Dose: 50.4 Gy
Reference Point Dosage Given to Date: 5.4 Gy
Reference Point Session Dosage Given: 1.8 Gy
Session Number: 3

## 2021-08-05 MED ORDER — SONAFINE EX EMUL
1.0000 | Freq: Once | CUTANEOUS | Status: AC
  Administered 2021-08-05: 1 via TOPICAL

## 2021-08-05 NOTE — Progress Notes (Signed)
Pt here for patient teaching.    Pt given Radiation and You booklet, skin care instructions, and Sonafine.    Reviewed areas of pertinence such as fatigue, hair loss in treatment field, skin changes, throat changes, cough, and shortness of breath .   Pt able to give teach back of to pat skin, use unscented/gentle soap, and drink plenty of water,apply Sonafine bid and avoid applying anything to skin within 4 hours of treatment.   Pt verbalizes understanding of information given and will contact nursing with any questions or concerns.         

## 2021-08-07 ENCOUNTER — Other Ambulatory Visit: Payer: Self-pay

## 2021-08-07 ENCOUNTER — Ambulatory Visit
Admission: RE | Admit: 2021-08-07 | Discharge: 2021-08-07 | Disposition: A | Discharge status: Home / Self Care | Payer: Medicare HMO | Source: Ambulatory Visit | Attending: Radiation Oncology | Admitting: Radiation Oncology

## 2021-08-07 DIAGNOSIS — D4989 Neoplasm of unspecified behavior of other specified sites: Secondary | ICD-10-CM | POA: Diagnosis not present

## 2021-08-07 DIAGNOSIS — C37 Malignant neoplasm of thymus: Secondary | ICD-10-CM | POA: Diagnosis not present

## 2021-08-07 DIAGNOSIS — Z510 Encounter for antineoplastic radiation therapy: Secondary | ICD-10-CM | POA: Diagnosis not present

## 2021-08-07 LAB — RAD ONC ARIA SESSION SUMMARY
Course Elapsed Days: 6
Plan Fractions Treated to Date: 4
Plan Prescribed Dose Per Fraction: 1.8 Gy
Plan Total Fractions Prescribed: 28
Plan Total Prescribed Dose: 50.4 Gy
Reference Point Dosage Given to Date: 7.2 Gy
Reference Point Session Dosage Given: 1.8 Gy
Session Number: 4

## 2021-08-08 ENCOUNTER — Ambulatory Visit
Admission: RE | Admit: 2021-08-08 | Discharge: 2021-08-08 | Disposition: A | Discharge status: Home / Self Care | Payer: Medicare HMO | Source: Ambulatory Visit | Attending: Radiation Oncology | Admitting: Radiation Oncology

## 2021-08-08 ENCOUNTER — Other Ambulatory Visit: Payer: Self-pay

## 2021-08-08 DIAGNOSIS — D4989 Neoplasm of unspecified behavior of other specified sites: Secondary | ICD-10-CM | POA: Diagnosis not present

## 2021-08-08 DIAGNOSIS — Z510 Encounter for antineoplastic radiation therapy: Secondary | ICD-10-CM | POA: Diagnosis not present

## 2021-08-08 DIAGNOSIS — C37 Malignant neoplasm of thymus: Secondary | ICD-10-CM | POA: Diagnosis not present

## 2021-08-08 LAB — RAD ONC ARIA SESSION SUMMARY
Course Elapsed Days: 7
Plan Fractions Treated to Date: 5
Plan Prescribed Dose Per Fraction: 1.8 Gy
Plan Total Fractions Prescribed: 28
Plan Total Prescribed Dose: 50.4 Gy
Reference Point Dosage Given to Date: 9 Gy
Reference Point Session Dosage Given: 1.8 Gy
Session Number: 5

## 2021-08-09 ENCOUNTER — Other Ambulatory Visit: Payer: Self-pay

## 2021-08-09 ENCOUNTER — Ambulatory Visit
Admission: RE | Admit: 2021-08-09 | Discharge: 2021-08-09 | Disposition: A | Discharge status: Home / Self Care | Payer: Medicare HMO | Source: Ambulatory Visit | Attending: Radiation Oncology | Admitting: Radiation Oncology

## 2021-08-09 DIAGNOSIS — Z510 Encounter for antineoplastic radiation therapy: Secondary | ICD-10-CM | POA: Diagnosis not present

## 2021-08-09 DIAGNOSIS — D4989 Neoplasm of unspecified behavior of other specified sites: Secondary | ICD-10-CM | POA: Diagnosis not present

## 2021-08-09 DIAGNOSIS — C37 Malignant neoplasm of thymus: Secondary | ICD-10-CM | POA: Diagnosis not present

## 2021-08-09 LAB — RAD ONC ARIA SESSION SUMMARY
Course Elapsed Days: 8
Plan Fractions Treated to Date: 6
Plan Prescribed Dose Per Fraction: 1.8 Gy
Plan Total Fractions Prescribed: 28
Plan Total Prescribed Dose: 50.4 Gy
Reference Point Dosage Given to Date: 10.8 Gy
Reference Point Session Dosage Given: 1.8 Gy
Session Number: 6

## 2021-08-12 ENCOUNTER — Ambulatory Visit
Admission: RE | Admit: 2021-08-12 | Discharge: 2021-08-12 | Disposition: A | Discharge status: Home / Self Care | Payer: Medicare HMO | Source: Ambulatory Visit | Attending: Radiation Oncology | Admitting: Radiation Oncology

## 2021-08-12 ENCOUNTER — Other Ambulatory Visit: Payer: Self-pay

## 2021-08-12 DIAGNOSIS — D4989 Neoplasm of unspecified behavior of other specified sites: Secondary | ICD-10-CM | POA: Diagnosis not present

## 2021-08-12 DIAGNOSIS — C37 Malignant neoplasm of thymus: Secondary | ICD-10-CM | POA: Diagnosis not present

## 2021-08-12 DIAGNOSIS — Z510 Encounter for antineoplastic radiation therapy: Secondary | ICD-10-CM | POA: Diagnosis not present

## 2021-08-12 LAB — RAD ONC ARIA SESSION SUMMARY
Course Elapsed Days: 11
Plan Fractions Treated to Date: 7
Plan Prescribed Dose Per Fraction: 1.8 Gy
Plan Total Fractions Prescribed: 28
Plan Total Prescribed Dose: 50.4 Gy
Reference Point Dosage Given to Date: 12.6 Gy
Reference Point Session Dosage Given: 1.8 Gy
Session Number: 7

## 2021-08-13 ENCOUNTER — Other Ambulatory Visit: Payer: Self-pay

## 2021-08-13 ENCOUNTER — Ambulatory Visit
Admission: RE | Admit: 2021-08-13 | Discharge: 2021-08-13 | Disposition: A | Discharge status: Home / Self Care | Payer: Medicare HMO | Source: Ambulatory Visit | Attending: Radiation Oncology | Admitting: Radiation Oncology

## 2021-08-13 ENCOUNTER — Ambulatory Visit: Payer: Medicare HMO

## 2021-08-13 DIAGNOSIS — C37 Malignant neoplasm of thymus: Secondary | ICD-10-CM | POA: Diagnosis not present

## 2021-08-13 DIAGNOSIS — D4989 Neoplasm of unspecified behavior of other specified sites: Secondary | ICD-10-CM | POA: Diagnosis not present

## 2021-08-13 DIAGNOSIS — Z510 Encounter for antineoplastic radiation therapy: Secondary | ICD-10-CM | POA: Diagnosis not present

## 2021-08-13 LAB — RAD ONC ARIA SESSION SUMMARY
Course Elapsed Days: 12
Plan Fractions Treated to Date: 8
Plan Prescribed Dose Per Fraction: 1.8 Gy
Plan Total Fractions Prescribed: 28
Plan Total Prescribed Dose: 50.4 Gy
Reference Point Dosage Given to Date: 14.4 Gy
Reference Point Session Dosage Given: 1.8 Gy
Session Number: 8

## 2021-08-14 ENCOUNTER — Other Ambulatory Visit: Payer: Self-pay

## 2021-08-14 ENCOUNTER — Ambulatory Visit
Admission: RE | Admit: 2021-08-14 | Discharge: 2021-08-14 | Disposition: A | Discharge status: Home / Self Care | Payer: Medicare HMO | Source: Ambulatory Visit | Attending: Radiation Oncology | Admitting: Radiation Oncology

## 2021-08-14 DIAGNOSIS — Z510 Encounter for antineoplastic radiation therapy: Secondary | ICD-10-CM | POA: Diagnosis not present

## 2021-08-14 DIAGNOSIS — D4989 Neoplasm of unspecified behavior of other specified sites: Secondary | ICD-10-CM | POA: Diagnosis not present

## 2021-08-14 DIAGNOSIS — C37 Malignant neoplasm of thymus: Secondary | ICD-10-CM | POA: Diagnosis not present

## 2021-08-14 LAB — RAD ONC ARIA SESSION SUMMARY
Course Elapsed Days: 13
Plan Fractions Treated to Date: 9
Plan Prescribed Dose Per Fraction: 1.8 Gy
Plan Total Fractions Prescribed: 28
Plan Total Prescribed Dose: 50.4 Gy
Reference Point Dosage Given to Date: 16.2 Gy
Reference Point Session Dosage Given: 1.8 Gy
Session Number: 9

## 2021-08-15 ENCOUNTER — Ambulatory Visit
Admission: RE | Admit: 2021-08-15 | Discharge: 2021-08-15 | Disposition: A | Discharge status: Home / Self Care | Payer: Medicare HMO | Source: Ambulatory Visit | Attending: Radiation Oncology | Admitting: Radiation Oncology

## 2021-08-15 ENCOUNTER — Other Ambulatory Visit: Payer: Self-pay

## 2021-08-15 DIAGNOSIS — D4989 Neoplasm of unspecified behavior of other specified sites: Secondary | ICD-10-CM | POA: Diagnosis not present

## 2021-08-15 DIAGNOSIS — C37 Malignant neoplasm of thymus: Secondary | ICD-10-CM | POA: Diagnosis not present

## 2021-08-15 DIAGNOSIS — Z510 Encounter for antineoplastic radiation therapy: Secondary | ICD-10-CM | POA: Diagnosis not present

## 2021-08-15 LAB — RAD ONC ARIA SESSION SUMMARY
Course Elapsed Days: 14
Plan Fractions Treated to Date: 10
Plan Prescribed Dose Per Fraction: 1.8 Gy
Plan Total Fractions Prescribed: 28
Plan Total Prescribed Dose: 50.4 Gy
Reference Point Dosage Given to Date: 18 Gy
Reference Point Session Dosage Given: 1.8 Gy
Session Number: 10

## 2021-08-16 ENCOUNTER — Ambulatory Visit
Admission: RE | Admit: 2021-08-16 | Discharge: 2021-08-16 | Disposition: A | Discharge status: Home / Self Care | Payer: Medicare HMO | Source: Ambulatory Visit | Attending: Radiation Oncology | Admitting: Radiation Oncology

## 2021-08-16 ENCOUNTER — Other Ambulatory Visit: Payer: Self-pay

## 2021-08-16 DIAGNOSIS — D4989 Neoplasm of unspecified behavior of other specified sites: Secondary | ICD-10-CM | POA: Diagnosis not present

## 2021-08-16 DIAGNOSIS — C37 Malignant neoplasm of thymus: Secondary | ICD-10-CM | POA: Diagnosis not present

## 2021-08-16 DIAGNOSIS — Z510 Encounter for antineoplastic radiation therapy: Secondary | ICD-10-CM | POA: Diagnosis not present

## 2021-08-16 LAB — RAD ONC ARIA SESSION SUMMARY
Course Elapsed Days: 15
Plan Fractions Treated to Date: 11
Plan Prescribed Dose Per Fraction: 1.8 Gy
Plan Total Fractions Prescribed: 28
Plan Total Prescribed Dose: 50.4 Gy
Reference Point Dosage Given to Date: 19.8 Gy
Reference Point Session Dosage Given: 1.8 Gy
Session Number: 11

## 2021-08-19 ENCOUNTER — Ambulatory Visit
Admission: RE | Admit: 2021-08-19 | Discharge: 2021-08-19 | Disposition: A | Discharge status: Home / Self Care | Payer: Medicare HMO | Source: Ambulatory Visit | Attending: Radiation Oncology | Admitting: Radiation Oncology

## 2021-08-19 ENCOUNTER — Other Ambulatory Visit: Payer: Self-pay

## 2021-08-19 DIAGNOSIS — Z510 Encounter for antineoplastic radiation therapy: Secondary | ICD-10-CM | POA: Diagnosis not present

## 2021-08-19 DIAGNOSIS — D4989 Neoplasm of unspecified behavior of other specified sites: Secondary | ICD-10-CM | POA: Diagnosis not present

## 2021-08-19 DIAGNOSIS — C37 Malignant neoplasm of thymus: Secondary | ICD-10-CM | POA: Diagnosis not present

## 2021-08-19 LAB — RAD ONC ARIA SESSION SUMMARY
Course Elapsed Days: 18
Plan Fractions Treated to Date: 12
Plan Prescribed Dose Per Fraction: 1.8 Gy
Plan Total Fractions Prescribed: 28
Plan Total Prescribed Dose: 50.4 Gy
Reference Point Dosage Given to Date: 21.6 Gy
Reference Point Session Dosage Given: 1.8 Gy
Session Number: 12

## 2021-08-20 ENCOUNTER — Encounter: Payer: Self-pay | Admitting: Family Medicine

## 2021-08-20 ENCOUNTER — Ambulatory Visit: Payer: Medicare HMO

## 2021-08-20 ENCOUNTER — Ambulatory Visit (INDEPENDENT_AMBULATORY_CARE_PROVIDER_SITE_OTHER): Payer: Medicare HMO | Admitting: Family Medicine

## 2021-08-20 VITALS — BP 119/87 | HR 87 | Temp 98.9°F | Ht 71.0 in | Wt 160.0 lb

## 2021-08-20 DIAGNOSIS — R634 Abnormal weight loss: Secondary | ICD-10-CM

## 2021-08-20 DIAGNOSIS — R7309 Other abnormal glucose: Secondary | ICD-10-CM

## 2021-08-20 DIAGNOSIS — E782 Mixed hyperlipidemia: Secondary | ICD-10-CM

## 2021-08-20 DIAGNOSIS — D4989 Neoplasm of unspecified behavior of other specified sites: Secondary | ICD-10-CM

## 2021-08-20 DIAGNOSIS — E039 Hypothyroidism, unspecified: Secondary | ICD-10-CM

## 2021-08-20 DIAGNOSIS — Z9089 Acquired absence of other organs: Secondary | ICD-10-CM | POA: Diagnosis not present

## 2021-08-20 NOTE — Progress Notes (Signed)
Subjective:  Patient ID: Antonio Scott, male    DOB: 01/08/1956, 66 y.o.   MRN: [id number]  Patient Care Team: Baruch Gouty, FNP as PCP - General (Family Medicine) Eloise Harman, DO as Consulting Physician (Gastroenterology) Derek Jack, MD as Medical Oncologist (Medical Oncology) Brien Mates, RN as Oncology Nurse Navigator (Medical Oncology)   Chief Complaint:  Medical Management of Chronic Issues   HPI: Antonio Scott is a 66 y.o. male presenting on 08/20/2021 for Medical Management of Chronic Issues   Pt presents today for management of chronic medical conditions. He is currently undergoing radiation therapy after thymectomy. He is followed by oncology and CT surgery on a regular basis. He has hypothyroidism and was started on repletion therapy. States he does Antonio feel he needs this anymore and has Antonio been taking it for several weeks. He has received upper and lower dentures since last visit and is tolerating well. His biggest concern today is weight loss. Aware this could be due to current treatment regimen. He denies fever, chills, confusion, weakness, or fatigue. Previous labs revealed elevated lipids and blood glucose. He is Antonio on medications for this. Denies polyuria, polydipsia, or polyphagia. Will complete A1C.    Relevant past medical, surgical, family, and social history reviewed and updated as indicated.  Allergies and medications reviewed and updated. Data reviewed: Chart in Epic.   Past Medical History:  Diagnosis Date   Anxiety    Hypothyroidism     Past Surgical History:  Procedure Laterality Date   INTERCOSTAL NERVE BLOCK Right 06/17/2021   Procedure: INTERCOSTAL NERVE BLOCK;  Surgeon: Lajuana Matte, MD;  Location: [facility] OR;  Service: Thoracic;  Laterality: Right;   SKIN LESION EXCISION      Social History   Socioeconomic History   Marital status: Single    Spouse name: Antonio Scott   Number of children: 0   Years of education:  Antonio Scott   Highest education level: Antonio Scott  Occupational History   Occupation: retired  Tobacco Use   Smoking status: Every Day    Packs/day: 0.20    Years: 50.00    Total pack years: 10.00    Types: Cigarettes   Smokeless tobacco: Never   Tobacco comments:    Currently smoking 7-10 cig daily  Vaping Use   Vaping Use: Never used  Substance and Sexual Activity   Alcohol use: Antonio Currently   Drug use: Antonio Currently   Sexual activity: Antonio Currently  Other Topics Concern   Antonio Scott  Social History Narrative   No children   Brother lives a mile away   Social Determinants of Health   Financial Resource Strain: Low Risk  (02/25/2021)   Overall Financial Resource Strain (CARDIA)    Difficulty of Paying Living Expenses: Antonio hard at all  Food Insecurity: No Food Insecurity (02/25/2021)   Hunger Vital Sign    Worried About Running Out of Food in the Last Year: Never true    Ran Out of Food in the Last Year: Never true  Transportation Needs: No Transportation Needs (02/25/2021)   PRAPARE - Hydrologist (Medical): No    Lack of Transportation (Non-Medical): No  Physical Activity: Sufficiently Active (02/25/2021)   Exercise Vital Sign    Days of Exercise per Week: 6 days    Minutes of Exercise per Session: 30 min  Stress: No Stress Concern Present (02/25/2021)   [place]  [employer] - Occupational Stress Questionnaire    Feeling of Stress : Only a little  Social Connections: Socially Isolated (02/25/2021)   Social Connection and Isolation Panel [NHANES]    Frequency of Communication with Friends and Family: Twice a week    Frequency of Social Gatherings with Friends and Family: Once a week    Attends Religious Services: Never    Marine scientist or Organizations: No    Attends Archivist Meetings: Never    Marital Status: Never married  Intimate Partner Violence: Antonio At Risk (02/25/2021)   Humiliation, Afraid,  Rape, and Kick questionnaire    Fear of Current or Ex-Partner: No    Emotionally Abused: No    Physically Abused: No    Sexually Abused: No    Outpatient Encounter Medications as of 08/20/2021  Medication Sig   levothyroxine (SYNTHROID) 25 MCG tablet Take 1 tablet (25 mcg total) by mouth daily.   nicotine polacrilex (COMMIT) 2 MG lozenge Take 2 mg by mouth as needed for smoking cessation.   [DISCONTINUED] neomycin-bacitracin-polymyxin (NEOSPORIN) ointment Apply 1 application. topically 2 (two) times daily as needed for wound care.   [DISCONTINUED] SHINGRIX injection    No facility-administered encounter medications on Scott as of 08/20/2021.    No Known Allergies  Review of Systems  Constitutional:  Positive for unexpected weight change. Negative for activity change, appetite change, chills, diaphoresis, fatigue and fever.  HENT: Negative.    Eyes: Negative.   Respiratory:  Negative for cough, chest tightness and shortness of breath.   Cardiovascular:  Negative for chest pain, palpitations and leg swelling.  Gastrointestinal:  Negative for abdominal pain, blood in stool, constipation, diarrhea, nausea and vomiting.  Endocrine: Negative.   Genitourinary:  Negative for decreased urine volume, difficulty urinating, dysuria, frequency and urgency.  Musculoskeletal:  Negative for arthralgias and myalgias.  Skin: Negative.   Allergic/Immunologic: Negative.   Neurological:  Negative for dizziness and headaches.  Hematological: Negative.   Psychiatric/Behavioral:  Negative for agitation, confusion, hallucinations, sleep disturbance and suicidal ideas.   All other systems reviewed and are negative.       Objective:  BP 119/87   Pulse 87   Temp 98.9 F (37.2 C)   Ht '5\' 11"'  (1.803 m)   Wt 160 lb (72.6 kg)   SpO2 95%   BMI 22.32 kg/m    Wt Readings from Last 3 Encounters:  08/20/21 160 lb (72.6 kg)  07/26/21 163 lb (73.9 kg)  07/17/21 162 lb 6 oz (73.7 kg)    Physical  Exam Vitals and nursing note reviewed.  Constitutional:      General: He is Antonio in acute distress.    Appearance: Normal appearance. He is well-developed, well-groomed and normal weight. He is Antonio ill-appearing, toxic-appearing or diaphoretic.  HENT:     Head: Normocephalic and atraumatic.     Jaw: There is normal jaw occlusion.     Right Ear: Hearing normal.     Left Ear: Hearing normal.     Nose: Nose normal.     Mouth/Throat:     Lips: Pink.     Mouth: Mucous membranes are moist.     Pharynx: Oropharynx is clear. Uvula midline.     Comments: Upper and lower dentures  Eyes:     General: Lids are normal.     Extraocular Movements: Extraocular movements intact.     Conjunctiva/sclera: Conjunctivae normal.     Pupils: Pupils are equal, round, and reactive  to light.  Neck:     Thyroid: No thyroid mass, thyromegaly or thyroid tenderness.     Vascular: No carotid bruit or JVD.     Trachea: Trachea and phonation normal.  Cardiovascular:     Rate and Rhythm: Normal rate and regular rhythm.     Chest Wall: PMI is Antonio displaced.     Pulses: Normal pulses.     Heart sounds: Normal heart sounds. No murmur heard.    No friction rub. No gallop.  Pulmonary:     Effort: Pulmonary effort is normal.     Breath sounds: Normal breath sounds.  Abdominal:     General: Bowel sounds are normal. There is no abdominal bruit.     Palpations: Abdomen is soft. There is no hepatomegaly or splenomegaly.  Musculoskeletal:        General: Normal range of motion.     Cervical back: Normal range of motion and neck supple.     Right lower leg: No edema.     Left lower leg: No edema.  Lymphadenopathy:     Cervical: No cervical adenopathy.  Skin:    General: Skin is warm and dry.     Capillary Refill: Capillary refill takes less than 2 seconds.     Coloration: Skin is Antonio cyanotic, jaundiced or pale.     Findings: No rash.  Neurological:     General: No focal deficit present.     Mental Status: He is  alert and oriented to person, place, and time.     Sensory: Sensation is intact.     Motor: Motor function is intact.     Coordination: Coordination is intact.     Gait: Gait is intact.     Deep Tendon Reflexes: Reflexes are normal and symmetric.  Psychiatric:        Attention and Perception: Attention and perception normal.        Mood and Affect: Mood and affect normal.        Speech: Speech normal.        Behavior: Behavior normal. Behavior is cooperative.        Thought Content: Thought content normal.        Cognition and Memory: Cognition and memory normal.        Judgment: Judgment normal.     Results for orders placed or performed in visit on 08/19/21  Rad Onc Aria Session Summary  Result Value Ref Range   Course ID C1_Chest    Course Intent Unknown    Course Start Date 07/23/2021  1:47 PM    Session Number 12    Course First Treatment Date 08/01/2021 12:23 PM    Course Last Treatment Date 08/19/2021  4:26 PM    Course Elapsed Days 18    Reference Point ID Chest DP    Reference Point Dosage Given to Date [id number] Gy   Reference Point Session Dosage Given [id number] Gy   Plan ID Chest    Plan Name _Hyb2.4    Plan Fractions Treated to Date 12    Plan Total Fractions Prescribed 28    Plan Prescribed Dose Per Fraction 1.8 Gy   Plan Total Prescribed Dose 50.400000 Gy   Plan Primary Reference Point Chest DP        Pertinent labs & imaging results that were available during my care of the patient were reviewed by me and considered in my medical decision making.  Assessment & Plan:  Jaime was seen today for  medical management of chronic issues.  Diagnoses and all orders for this visit:  Acquired hypothyroidism Has Antonio been taking repletion therapy. Will check labs today and restart if necessary.  -     Thyroid Panel With TSH  Elevated glucose Will check below labs along with A1C and start therapy if warranted.  -     CMP14+EGFR -     CBC with  Differential/Platelet -     Bayer DCA Hb A1c Waived  Weight loss Likely due to current treatment regimen. Will check labs for potential underlying causes.  -     CMP14+EGFR -     CBC with Differential/Platelet -     Thyroid Panel With TSH -     Bayer DCA Hb A1c Waived  Thymoma S/P thymectomy Currently undergoing radiation therapy.   Mixed hyperlipidemia Labs pending. Diet and exercise encouraged. Will discuss medications if warranted.  -     Lipid panel     Continue all other maintenance medications.  Follow up plan: Return in about 6 months (around 02/20/2022), or if symptoms worsen or fail to improve.   Continue healthy lifestyle choices, including diet (rich in fruits, vegetables, and lean proteins, and low in salt and simple carbohydrates) and exercise (at least 30 minutes of moderate physical activity daily).   The above assessment and management plan was discussed with the patient. The patient verbalized understanding of and has agreed to the management plan. Patient is aware to call the clinic if they develop any new symptoms or if symptoms persist or worsen. Patient is aware when to return to the clinic for a follow-up visit. Patient educated on when it is appropriate to go to the emergency department.   Monia Pouch, FNP-C [facility] [phone]

## 2021-08-21 ENCOUNTER — Other Ambulatory Visit: Payer: Self-pay

## 2021-08-21 ENCOUNTER — Ambulatory Visit
Admission: RE | Admit: 2021-08-21 | Discharge: 2021-08-21 | Disposition: A | Discharge status: Home / Self Care | Payer: Medicare HMO | Source: Ambulatory Visit | Attending: Radiation Oncology | Admitting: Radiation Oncology

## 2021-08-21 ENCOUNTER — Ambulatory Visit: Payer: Medicare HMO

## 2021-08-21 DIAGNOSIS — C37 Malignant neoplasm of thymus: Secondary | ICD-10-CM | POA: Diagnosis not present

## 2021-08-21 DIAGNOSIS — Z510 Encounter for antineoplastic radiation therapy: Secondary | ICD-10-CM | POA: Diagnosis not present

## 2021-08-21 DIAGNOSIS — D4989 Neoplasm of unspecified behavior of other specified sites: Secondary | ICD-10-CM | POA: Diagnosis not present

## 2021-08-21 LAB — RAD ONC ARIA SESSION SUMMARY
Course Elapsed Days: 20
Plan Fractions Treated to Date: 13
Plan Prescribed Dose Per Fraction: 1.8 Gy
Plan Total Fractions Prescribed: 28
Plan Total Prescribed Dose: 50.4 Gy
Reference Point Dosage Given to Date: 23.4 Gy
Reference Point Session Dosage Given: 1.8 Gy
Session Number: 13

## 2021-08-21 LAB — CBC WITH DIFFERENTIAL/PLATELET
Basophils Absolute: 0.1 10*3/uL (ref 0.0–0.2)
Basos: 2 %
EOS (ABSOLUTE): 0.2 10*3/uL (ref 0.0–0.4)
Eos: 5 %
Hematocrit: 41.5 % (ref 37.5–51.0)
Hemoglobin: 14.2 g/dL (ref 13.0–17.7)
Immature Grans (Abs): 0 10*3/uL (ref 0.0–0.1)
Immature Granulocytes: 0 %
Lymphocytes Absolute: 0.7 10*3/uL (ref 0.7–3.1)
Lymphs: 14 %
MCH: 29.8 pg (ref 26.6–33.0)
MCHC: 34.2 g/dL (ref 31.5–35.7)
MCV: 87 fL (ref 79–97)
Monocytes Absolute: 0.5 10*3/uL (ref 0.1–0.9)
Monocytes: 11 %
Neutrophils Absolute: 3.3 10*3/uL (ref 1.4–7.0)
Neutrophils: 68 %
Platelets: 253 10*3/uL (ref 150–450)
RBC: 4.76 x10E6/uL (ref 4.14–5.80)
RDW: 13 % (ref 11.6–15.4)
WBC: 4.8 10*3/uL (ref 3.4–10.8)

## 2021-08-21 LAB — CMP14+EGFR
ALT: 16 IU/L (ref 0–44)
AST: 19 IU/L (ref 0–40)
Albumin/Globulin Ratio: 1.5 (ref 1.2–2.2)
Albumin: 4.3 g/dL (ref 3.9–4.9)
Alkaline Phosphatase: 102 IU/L (ref 44–121)
BUN/Creatinine Ratio: 13 (ref 10–24)
BUN: 22 mg/dL (ref 8–27)
Bilirubin Total: 0.3 mg/dL (ref 0.0–1.2)
CO2: 24 mmol/L (ref 20–29)
Calcium: 9.8 mg/dL (ref 8.6–10.2)
Chloride: 103 mmol/L (ref 96–106)
Creatinine, Ser: 1.76 mg/dL — ABNORMAL HIGH (ref 0.76–1.27)
Globulin, Total: 2.9 g/dL (ref 1.5–4.5)
Glucose: 87 mg/dL (ref 70–99)
Potassium: 5.1 mmol/L (ref 3.5–5.2)
Sodium: 139 mmol/L (ref 134–144)
Total Protein: 7.2 g/dL (ref 6.0–8.5)
eGFR: 42 mL/min/{1.73_m2} — ABNORMAL LOW (ref >59)

## 2021-08-21 LAB — LIPID PANEL
Chol/HDL Ratio: 2.7 ratio (ref 0.0–5.0)
Cholesterol, Total: 146 mg/dL (ref 100–199)
HDL: 54 mg/dL (ref >39)
LDL Chol Calc (NIH): 78 mg/dL (ref 0–99)
Triglycerides: 71 mg/dL (ref 0–149)
VLDL Cholesterol Cal: 14 mg/dL (ref 5–40)

## 2021-08-21 LAB — BAYER DCA HB A1C WAIVED: HB A1C (BAYER DCA - WAIVED): 5.3 % (ref 4.8–5.6)

## 2021-08-21 LAB — THYROID PANEL WITH TSH
Free Thyroxine Index: 2.3 (ref 1.2–4.9)
T3 Uptake Ratio: 33 % (ref 24–39)
T4, Total: 7 ug/dL (ref 4.5–12.0)
TSH: 9.9 u[IU]/mL — ABNORMAL HIGH (ref 0.450–4.500)

## 2021-08-21 MED ORDER — LEVOTHYROXINE SODIUM 25 MCG PO TABS: 25.0000 ug | ORAL_TABLET | Freq: Every day | ORAL | 3 refills | Status: DC

## 2021-08-21 NOTE — Addendum Note (Signed)
Addended by: Baruch Gouty on: 08/21/2021 01:02 PM   Modules accepted: Orders

## 2021-08-22 ENCOUNTER — Ambulatory Visit
Admission: RE | Admit: 2021-08-22 | Discharge: 2021-08-22 | Disposition: A | Discharge status: Home / Self Care | Payer: Medicare HMO | Source: Ambulatory Visit | Attending: Radiation Oncology | Admitting: Radiation Oncology

## 2021-08-22 ENCOUNTER — Other Ambulatory Visit: Payer: Self-pay

## 2021-08-22 DIAGNOSIS — Z510 Encounter for antineoplastic radiation therapy: Secondary | ICD-10-CM | POA: Diagnosis not present

## 2021-08-22 DIAGNOSIS — C37 Malignant neoplasm of thymus: Secondary | ICD-10-CM | POA: Diagnosis not present

## 2021-08-22 DIAGNOSIS — D4989 Neoplasm of unspecified behavior of other specified sites: Secondary | ICD-10-CM | POA: Diagnosis not present

## 2021-08-22 LAB — RAD ONC ARIA SESSION SUMMARY
Course Elapsed Days: 21
Plan Fractions Treated to Date: 14
Plan Prescribed Dose Per Fraction: 1.8 Gy
Plan Total Fractions Prescribed: 28
Plan Total Prescribed Dose: 50.4 Gy
Reference Point Dosage Given to Date: 25.2 Gy
Reference Point Session Dosage Given: 1.8 Gy
Session Number: 14

## 2021-08-23 ENCOUNTER — Ambulatory Visit
Admission: RE | Admit: 2021-08-23 | Discharge: 2021-08-23 | Disposition: A | Discharge status: Home / Self Care | Payer: Medicare HMO | Source: Ambulatory Visit | Attending: Radiation Oncology | Admitting: Radiation Oncology

## 2021-08-23 ENCOUNTER — Other Ambulatory Visit: Payer: Self-pay

## 2021-08-23 DIAGNOSIS — D4989 Neoplasm of unspecified behavior of other specified sites: Secondary | ICD-10-CM | POA: Diagnosis not present

## 2021-08-23 DIAGNOSIS — Z510 Encounter for antineoplastic radiation therapy: Secondary | ICD-10-CM | POA: Diagnosis not present

## 2021-08-23 DIAGNOSIS — C37 Malignant neoplasm of thymus: Secondary | ICD-10-CM | POA: Diagnosis not present

## 2021-08-23 LAB — RAD ONC ARIA SESSION SUMMARY
Course Elapsed Days: 22
Plan Fractions Treated to Date: 15
Plan Prescribed Dose Per Fraction: 1.8 Gy
Plan Total Fractions Prescribed: 28
Plan Total Prescribed Dose: 50.4 Gy
Reference Point Dosage Given to Date: 27 Gy
Reference Point Session Dosage Given: 1.8 Gy
Session Number: 15

## 2021-08-26 ENCOUNTER — Other Ambulatory Visit: Payer: Self-pay

## 2021-08-26 ENCOUNTER — Ambulatory Visit
Admission: RE | Admit: 2021-08-26 | Discharge: 2021-08-26 | Disposition: A | Discharge status: Home / Self Care | Payer: Medicare HMO | Source: Ambulatory Visit | Attending: Radiation Oncology | Admitting: Radiation Oncology

## 2021-08-26 DIAGNOSIS — Z510 Encounter for antineoplastic radiation therapy: Secondary | ICD-10-CM | POA: Diagnosis not present

## 2021-08-26 DIAGNOSIS — C37 Malignant neoplasm of thymus: Secondary | ICD-10-CM | POA: Diagnosis not present

## 2021-08-26 DIAGNOSIS — D4989 Neoplasm of unspecified behavior of other specified sites: Secondary | ICD-10-CM | POA: Diagnosis not present

## 2021-08-26 LAB — RAD ONC ARIA SESSION SUMMARY
Course Elapsed Days: 25
Plan Fractions Treated to Date: 16
Plan Prescribed Dose Per Fraction: 1.8 Gy
Plan Total Fractions Prescribed: 28
Plan Total Prescribed Dose: 50.4 Gy
Reference Point Dosage Given to Date: 28.8 Gy
Reference Point Session Dosage Given: 1.8 Gy
Session Number: 16

## 2021-08-27 ENCOUNTER — Ambulatory Visit
Admission: RE | Admit: 2021-08-27 | Discharge: 2021-08-27 | Disposition: A | Discharge status: Home / Self Care | Payer: Medicare HMO | Source: Ambulatory Visit | Attending: Radiation Oncology | Admitting: Radiation Oncology

## 2021-08-27 ENCOUNTER — Other Ambulatory Visit: Payer: Self-pay

## 2021-08-27 DIAGNOSIS — Z510 Encounter for antineoplastic radiation therapy: Secondary | ICD-10-CM | POA: Diagnosis not present

## 2021-08-27 DIAGNOSIS — C37 Malignant neoplasm of thymus: Secondary | ICD-10-CM | POA: Diagnosis not present

## 2021-08-27 DIAGNOSIS — D4989 Neoplasm of unspecified behavior of other specified sites: Secondary | ICD-10-CM | POA: Diagnosis not present

## 2021-08-27 LAB — RAD ONC ARIA SESSION SUMMARY
Course Elapsed Days: 26
Plan Fractions Treated to Date: 17
Plan Prescribed Dose Per Fraction: 1.8 Gy
Plan Total Fractions Prescribed: 28
Plan Total Prescribed Dose: 50.4 Gy
Reference Point Dosage Given to Date: 30.6 Gy
Reference Point Session Dosage Given: 1.8 Gy
Session Number: 17

## 2021-08-28 ENCOUNTER — Ambulatory Visit
Admission: RE | Admit: 2021-08-28 | Discharge: 2021-08-28 | Disposition: A | Discharge status: Home / Self Care | Payer: Medicare HMO | Source: Ambulatory Visit | Attending: Radiation Oncology | Admitting: Radiation Oncology

## 2021-08-28 ENCOUNTER — Other Ambulatory Visit: Payer: Self-pay

## 2021-08-28 DIAGNOSIS — C37 Malignant neoplasm of thymus: Secondary | ICD-10-CM | POA: Diagnosis not present

## 2021-08-28 DIAGNOSIS — Z510 Encounter for antineoplastic radiation therapy: Secondary | ICD-10-CM | POA: Diagnosis not present

## 2021-08-28 DIAGNOSIS — D4989 Neoplasm of unspecified behavior of other specified sites: Secondary | ICD-10-CM | POA: Diagnosis not present

## 2021-08-28 LAB — RAD ONC ARIA SESSION SUMMARY
Course Elapsed Days: 27
Plan Fractions Treated to Date: 18
Plan Prescribed Dose Per Fraction: 1.8 Gy
Plan Total Fractions Prescribed: 28
Plan Total Prescribed Dose: 50.4 Gy
Reference Point Dosage Given to Date: 32.4 Gy
Reference Point Session Dosage Given: 1.8 Gy
Session Number: 18

## 2021-08-29 ENCOUNTER — Other Ambulatory Visit: Payer: Self-pay

## 2021-08-29 ENCOUNTER — Ambulatory Visit
Admission: RE | Admit: 2021-08-29 | Discharge: 2021-08-29 | Disposition: A | Discharge status: Home / Self Care | Payer: Medicare HMO | Source: Ambulatory Visit | Attending: Radiation Oncology | Admitting: Radiation Oncology

## 2021-08-29 DIAGNOSIS — D4989 Neoplasm of unspecified behavior of other specified sites: Secondary | ICD-10-CM | POA: Diagnosis not present

## 2021-08-29 DIAGNOSIS — Z510 Encounter for antineoplastic radiation therapy: Secondary | ICD-10-CM | POA: Diagnosis not present

## 2021-08-29 DIAGNOSIS — C37 Malignant neoplasm of thymus: Secondary | ICD-10-CM | POA: Diagnosis not present

## 2021-08-29 LAB — RAD ONC ARIA SESSION SUMMARY
Course Elapsed Days: 28
Plan Fractions Treated to Date: 19
Plan Prescribed Dose Per Fraction: 1.8 Gy
Plan Total Fractions Prescribed: 28
Plan Total Prescribed Dose: 50.4 Gy
Reference Point Dosage Given to Date: 34.2 Gy
Reference Point Session Dosage Given: 1.8 Gy
Session Number: 19

## 2021-08-30 ENCOUNTER — Other Ambulatory Visit: Payer: Self-pay

## 2021-08-30 ENCOUNTER — Ambulatory Visit
Admission: RE | Admit: 2021-08-30 | Discharge: 2021-08-30 | Disposition: A | Discharge status: Home / Self Care | Payer: Medicare HMO | Source: Ambulatory Visit | Attending: Radiation Oncology | Admitting: Radiation Oncology

## 2021-08-30 DIAGNOSIS — C37 Malignant neoplasm of thymus: Secondary | ICD-10-CM | POA: Diagnosis not present

## 2021-08-30 DIAGNOSIS — D4989 Neoplasm of unspecified behavior of other specified sites: Secondary | ICD-10-CM | POA: Diagnosis not present

## 2021-08-30 DIAGNOSIS — Z510 Encounter for antineoplastic radiation therapy: Secondary | ICD-10-CM | POA: Diagnosis not present

## 2021-08-30 LAB — RAD ONC ARIA SESSION SUMMARY
Course Elapsed Days: 29
Plan Fractions Treated to Date: 20
Plan Prescribed Dose Per Fraction: 1.8 Gy
Plan Total Fractions Prescribed: 28
Plan Total Prescribed Dose: 50.4 Gy
Reference Point Dosage Given to Date: 36 Gy
Reference Point Session Dosage Given: 1.8 Gy
Session Number: 20

## 2021-09-02 ENCOUNTER — Ambulatory Visit
Admission: RE | Admit: 2021-09-02 | Discharge: 2021-09-02 | Disposition: A | Discharge status: Home / Self Care | Payer: Medicare HMO | Source: Ambulatory Visit | Attending: Radiation Oncology | Admitting: Radiation Oncology

## 2021-09-02 ENCOUNTER — Other Ambulatory Visit: Payer: Self-pay

## 2021-09-02 DIAGNOSIS — C37 Malignant neoplasm of thymus: Secondary | ICD-10-CM | POA: Diagnosis not present

## 2021-09-02 DIAGNOSIS — Z510 Encounter for antineoplastic radiation therapy: Secondary | ICD-10-CM | POA: Diagnosis not present

## 2021-09-02 DIAGNOSIS — D4989 Neoplasm of unspecified behavior of other specified sites: Secondary | ICD-10-CM | POA: Diagnosis not present

## 2021-09-02 LAB — RAD ONC ARIA SESSION SUMMARY
Course Elapsed Days: 32
Plan Fractions Treated to Date: 21
Plan Prescribed Dose Per Fraction: 1.8 Gy
Plan Total Fractions Prescribed: 28
Plan Total Prescribed Dose: 50.4 Gy
Reference Point Dosage Given to Date: 37.8 Gy
Reference Point Session Dosage Given: 1.8 Gy
Session Number: 21

## 2021-09-03 ENCOUNTER — Other Ambulatory Visit: Payer: Self-pay

## 2021-09-03 ENCOUNTER — Ambulatory Visit: Payer: Medicare HMO

## 2021-09-03 ENCOUNTER — Ambulatory Visit
Admission: RE | Admit: 2021-09-03 | Discharge: 2021-09-03 | Disposition: A | Discharge status: Home / Self Care | Payer: Medicare HMO | Source: Ambulatory Visit | Attending: Radiation Oncology | Admitting: Radiation Oncology

## 2021-09-03 DIAGNOSIS — D4989 Neoplasm of unspecified behavior of other specified sites: Secondary | ICD-10-CM | POA: Diagnosis not present

## 2021-09-03 DIAGNOSIS — C37 Malignant neoplasm of thymus: Secondary | ICD-10-CM | POA: Diagnosis not present

## 2021-09-03 DIAGNOSIS — Z510 Encounter for antineoplastic radiation therapy: Secondary | ICD-10-CM | POA: Diagnosis not present

## 2021-09-03 LAB — RAD ONC ARIA SESSION SUMMARY
Course Elapsed Days: 33
Plan Fractions Treated to Date: 22
Plan Prescribed Dose Per Fraction: 1.8 Gy
Plan Total Fractions Prescribed: 28
Plan Total Prescribed Dose: 50.4 Gy
Reference Point Dosage Given to Date: 39.6 Gy
Reference Point Session Dosage Given: 1.8 Gy
Session Number: 22

## 2021-09-04 ENCOUNTER — Other Ambulatory Visit: Payer: Self-pay

## 2021-09-04 ENCOUNTER — Ambulatory Visit
Admission: RE | Admit: 2021-09-04 | Discharge: 2021-09-04 | Disposition: A | Discharge status: Home / Self Care | Payer: Medicare HMO | Source: Ambulatory Visit | Attending: Radiation Oncology | Admitting: Radiation Oncology

## 2021-09-04 DIAGNOSIS — D4989 Neoplasm of unspecified behavior of other specified sites: Secondary | ICD-10-CM | POA: Diagnosis not present

## 2021-09-04 DIAGNOSIS — Z510 Encounter for antineoplastic radiation therapy: Secondary | ICD-10-CM | POA: Diagnosis not present

## 2021-09-04 DIAGNOSIS — C37 Malignant neoplasm of thymus: Secondary | ICD-10-CM | POA: Diagnosis not present

## 2021-09-04 LAB — RAD ONC ARIA SESSION SUMMARY
Course Elapsed Days: 34
Plan Fractions Treated to Date: 23
Plan Prescribed Dose Per Fraction: 1.8 Gy
Plan Total Fractions Prescribed: 28
Plan Total Prescribed Dose: 50.4 Gy
Reference Point Dosage Given to Date: 41.4 Gy
Reference Point Session Dosage Given: 1.8 Gy
Session Number: 23

## 2021-09-05 ENCOUNTER — Ambulatory Visit
Admission: RE | Admit: 2021-09-05 | Discharge: 2021-09-05 | Disposition: A | Discharge status: Home / Self Care | Payer: Medicare HMO | Source: Ambulatory Visit | Attending: Radiation Oncology | Admitting: Radiation Oncology

## 2021-09-05 ENCOUNTER — Ambulatory Visit: Payer: Medicare HMO | Admitting: Family Medicine

## 2021-09-05 ENCOUNTER — Other Ambulatory Visit: Payer: Self-pay

## 2021-09-05 DIAGNOSIS — Z510 Encounter for antineoplastic radiation therapy: Secondary | ICD-10-CM | POA: Diagnosis not present

## 2021-09-05 DIAGNOSIS — C37 Malignant neoplasm of thymus: Secondary | ICD-10-CM | POA: Diagnosis not present

## 2021-09-05 DIAGNOSIS — D4989 Neoplasm of unspecified behavior of other specified sites: Secondary | ICD-10-CM | POA: Diagnosis not present

## 2021-09-05 LAB — RAD ONC ARIA SESSION SUMMARY
Course Elapsed Days: 35
Plan Fractions Treated to Date: 24
Plan Prescribed Dose Per Fraction: 1.8 Gy
Plan Total Fractions Prescribed: 28
Plan Total Prescribed Dose: 50.4 Gy
Reference Point Dosage Given to Date: 43.2 Gy
Reference Point Session Dosage Given: 1.8 Gy
Session Number: 24

## 2021-09-06 ENCOUNTER — Ambulatory Visit
Admission: RE | Admit: 2021-09-06 | Discharge: 2021-09-06 | Disposition: A | Discharge status: Home / Self Care | Payer: Medicare HMO | Source: Ambulatory Visit | Attending: Radiation Oncology | Admitting: Radiation Oncology

## 2021-09-06 ENCOUNTER — Other Ambulatory Visit: Payer: Self-pay

## 2021-09-06 DIAGNOSIS — D4989 Neoplasm of unspecified behavior of other specified sites: Secondary | ICD-10-CM | POA: Diagnosis not present

## 2021-09-06 DIAGNOSIS — C37 Malignant neoplasm of thymus: Secondary | ICD-10-CM | POA: Diagnosis not present

## 2021-09-06 DIAGNOSIS — Z510 Encounter for antineoplastic radiation therapy: Secondary | ICD-10-CM | POA: Diagnosis not present

## 2021-09-06 LAB — RAD ONC ARIA SESSION SUMMARY
Course Elapsed Days: 36
Plan Fractions Treated to Date: 25
Plan Prescribed Dose Per Fraction: 1.8 Gy
Plan Total Fractions Prescribed: 28
Plan Total Prescribed Dose: 50.4 Gy
Reference Point Dosage Given to Date: 45 Gy
Reference Point Session Dosage Given: 1.8 Gy
Session Number: 25

## 2021-09-07 ENCOUNTER — Emergency Department (HOSPITAL_COMMUNITY): Payer: Medicare HMO

## 2021-09-07 ENCOUNTER — Inpatient Hospital Stay (HOSPITAL_COMMUNITY)
Admission: EM | Admit: 2021-09-07 | Discharge: 2021-09-09 | DRG: 885 | Disposition: A | Discharge status: Home / Self Care | Payer: Medicare HMO | Attending: Internal Medicine | Admitting: Internal Medicine

## 2021-09-07 ENCOUNTER — Other Ambulatory Visit: Payer: Self-pay

## 2021-09-07 DIAGNOSIS — N1832 Chronic kidney disease, stage 3b: Secondary | ICD-10-CM | POA: Diagnosis present

## 2021-09-07 DIAGNOSIS — D4989 Neoplasm of unspecified behavior of other specified sites: Secondary | ICD-10-CM

## 2021-09-07 DIAGNOSIS — F23 Brief psychotic disorder: Secondary | ICD-10-CM | POA: Diagnosis not present

## 2021-09-07 DIAGNOSIS — R000 Tachycardia, unspecified: Secondary | ICD-10-CM | POA: Diagnosis not present

## 2021-09-07 DIAGNOSIS — Z9183 Wandering in diseases classified elsewhere: Secondary | ICD-10-CM

## 2021-09-07 DIAGNOSIS — F17210 Nicotine dependence, cigarettes, uncomplicated: Secondary | ICD-10-CM | POA: Diagnosis not present

## 2021-09-07 DIAGNOSIS — G9341 Metabolic encephalopathy: Secondary | ICD-10-CM | POA: Diagnosis not present

## 2021-09-07 DIAGNOSIS — Z923 Personal history of irradiation: Secondary | ICD-10-CM

## 2021-09-07 DIAGNOSIS — Z800 Family history of malignant neoplasm of digestive organs: Secondary | ICD-10-CM | POA: Diagnosis not present

## 2021-09-07 DIAGNOSIS — E785 Hyperlipidemia, unspecified: Secondary | ICD-10-CM | POA: Diagnosis not present

## 2021-09-07 DIAGNOSIS — Z20822 Contact with and (suspected) exposure to covid-19: Secondary | ICD-10-CM | POA: Diagnosis present

## 2021-09-07 DIAGNOSIS — E039 Hypothyroidism, unspecified: Secondary | ICD-10-CM | POA: Diagnosis not present

## 2021-09-07 DIAGNOSIS — Z823 Family history of stroke: Secondary | ICD-10-CM | POA: Diagnosis not present

## 2021-09-07 DIAGNOSIS — Z79890 Hormone replacement therapy (postmenopausal): Secondary | ICD-10-CM | POA: Diagnosis not present

## 2021-09-07 DIAGNOSIS — Z833 Family history of diabetes mellitus: Secondary | ICD-10-CM | POA: Diagnosis not present

## 2021-09-07 DIAGNOSIS — G9340 Encephalopathy, unspecified: Secondary | ICD-10-CM

## 2021-09-07 DIAGNOSIS — R4182 Altered mental status, unspecified: Secondary | ICD-10-CM | POA: Diagnosis not present

## 2021-09-07 DIAGNOSIS — J9859 Other diseases of mediastinum, not elsewhere classified: Secondary | ICD-10-CM | POA: Diagnosis not present

## 2021-09-07 DIAGNOSIS — J929 Pleural plaque without asbestos: Secondary | ICD-10-CM | POA: Diagnosis not present

## 2021-09-07 DIAGNOSIS — Z01818 Encounter for other preprocedural examination: Secondary | ICD-10-CM | POA: Diagnosis not present

## 2021-09-07 DIAGNOSIS — R69 Illness, unspecified: Secondary | ICD-10-CM | POA: Diagnosis not present

## 2021-09-07 LAB — COMPREHENSIVE METABOLIC PANEL
ALT: 18 U/L (ref 0–44)
AST: 18 U/L (ref 15–41)
Albumin: 4.4 g/dL (ref 3.5–5.0)
Alkaline Phosphatase: 70 U/L (ref 38–126)
Anion gap: 9 (ref 5–15)
BUN: 15 mg/dL (ref 8–23)
CO2: 22 mmol/L (ref 22–32)
Calcium: 9.4 mg/dL (ref 8.9–10.3)
Chloride: 109 mmol/L (ref 98–111)
Creatinine, Ser: 1.52 mg/dL — ABNORMAL HIGH (ref 0.61–1.24)
GFR, Estimated: 50 mL/min — ABNORMAL LOW (ref >60)
Glucose, Bld: 108 mg/dL — ABNORMAL HIGH (ref 70–99)
Potassium: 3.9 mmol/L (ref 3.5–5.1)
Sodium: 140 mmol/L (ref 135–145)
Total Bilirubin: 0.6 mg/dL (ref 0.3–1.2)
Total Protein: 7.9 g/dL (ref 6.5–8.1)

## 2021-09-07 LAB — URINALYSIS, ROUTINE W REFLEX MICROSCOPIC
Bilirubin Urine: NEGATIVE
Glucose, UA: NEGATIVE mg/dL
Hgb urine dipstick: NEGATIVE
Ketones, ur: NEGATIVE mg/dL
Leukocytes,Ua: NEGATIVE
Nitrite: NEGATIVE
Protein, ur: NEGATIVE mg/dL
Specific Gravity, Urine: 1.006 (ref 1.005–1.030)
pH: 6 (ref 5.0–8.0)

## 2021-09-07 LAB — CBC WITH DIFFERENTIAL/PLATELET
Abs Immature Granulocytes: 0.02 10*3/uL (ref 0.00–0.07)
Basophils Absolute: 0.1 10*3/uL (ref 0.0–0.1)
Basophils Relative: 1 %
Eosinophils Absolute: 0.2 10*3/uL (ref 0.0–0.5)
Eosinophils Relative: 2 %
HCT: 44.8 % (ref 39.0–52.0)
Hemoglobin: 14.9 g/dL (ref 13.0–17.0)
Immature Granulocytes: 0 %
Lymphocytes Relative: 6 %
Lymphs Abs: 0.4 10*3/uL — ABNORMAL LOW (ref 0.7–4.0)
MCH: 29.6 pg (ref 26.0–34.0)
MCHC: 33.3 g/dL (ref 30.0–36.0)
MCV: 89.1 fL (ref 80.0–100.0)
Monocytes Absolute: 0.4 10*3/uL (ref 0.1–1.0)
Monocytes Relative: 6 %
Neutro Abs: 5.8 10*3/uL (ref 1.7–7.7)
Neutrophils Relative %: 85 %
Platelets: 160 10*3/uL (ref 150–400)
RBC: 5.03 MIL/uL (ref 4.22–5.81)
RDW: 14.3 % (ref 11.5–15.5)
WBC: 6.9 10*3/uL (ref 4.0–10.5)
nRBC: 0 % (ref 0.0–0.2)

## 2021-09-07 LAB — RESP PANEL BY RT-PCR (FLU A&B, COVID) ARPGX2
Influenza A by PCR: NEGATIVE
Influenza B by PCR: NEGATIVE
SARS Coronavirus 2 by RT PCR: NEGATIVE

## 2021-09-07 LAB — RAPID URINE DRUG SCREEN, HOSP PERFORMED
Amphetamines: NOT DETECTED
Barbiturates: NOT DETECTED
Benzodiazepines: NOT DETECTED
Cocaine: NOT DETECTED
Opiates: NOT DETECTED
Tetrahydrocannabinol: NOT DETECTED

## 2021-09-07 LAB — MAGNESIUM: Magnesium: 2.1 mg/dL (ref 1.7–2.4)

## 2021-09-07 LAB — RETICULOCYTES
Immature Retic Fract: 6.2 % (ref 2.3–15.9)
RBC.: 5 MIL/uL (ref 4.22–5.81)
Retic Count, Absolute: 38 10*3/uL (ref 19.0–186.0)
Retic Ct Pct: 0.8 % (ref 0.4–3.1)

## 2021-09-07 LAB — CBG MONITORING, ED: Glucose-Capillary: 88 mg/dL (ref 70–99)

## 2021-09-07 LAB — AMMONIA: Ammonia: 17 umol/L (ref 9–35)

## 2021-09-07 LAB — ETHANOL: Alcohol, Ethyl (B): <10 mg/dL (ref <10)

## 2021-09-07 LAB — TSH: TSH: 12.977 u[IU]/mL — ABNORMAL HIGH (ref 0.350–4.500)

## 2021-09-07 MED ORDER — ZIPRASIDONE MESYLATE 20 MG IM SOLR
10.0000 mg | INTRAMUSCULAR | Status: AC | PRN
  Administered 2021-09-07: 10 mg via INTRAMUSCULAR
  Filled 2021-09-07: qty 20

## 2021-09-07 MED ORDER — STERILE WATER FOR INJECTION IJ SOLN
INTRAMUSCULAR | Status: AC
  Filled 2021-09-07: qty 10

## 2021-09-07 MED ORDER — LORAZEPAM 1 MG PO TABS: 1.0000 mg | ORAL_TABLET | ORAL | Status: DC | PRN

## 2021-09-07 MED ORDER — RISPERIDONE 1 MG PO TBDP
2.0000 mg | ORAL_TABLET | Freq: Three times a day (TID) | ORAL | Status: DC | PRN
  Filled 2021-09-07: qty 2

## 2021-09-07 MED ORDER — GADOBUTROL 1 MMOL/ML IV SOLN
7.0000 mL | Freq: Once | INTRAVENOUS | Status: AC | PRN
  Administered 2021-09-07: 7 mL via INTRAVENOUS

## 2021-09-07 MED ORDER — LORAZEPAM 2 MG/ML IJ SOLN
INTRAMUSCULAR | Status: AC
  Administered 2021-09-07: 1 mg via INTRAMUSCULAR
  Filled 2021-09-07: qty 1

## 2021-09-07 MED ORDER — LACTATED RINGERS BOLUS PEDS
1000.0000 mL | Freq: Once | INTRAVENOUS | Status: AC
  Administered 2021-09-07: 1000 mL via INTRAVENOUS

## 2021-09-07 MED ORDER — LEVOTHYROXINE SODIUM 50 MCG PO TABS
50.0000 ug | ORAL_TABLET | Freq: Every day | ORAL | Status: DC
  Administered 2021-09-08: 50 ug via ORAL
  Filled 2021-09-07: qty 1

## 2021-09-07 MED ORDER — LORAZEPAM 2 MG/ML IJ SOLN: 1.0000 mg | Freq: Once | INTRAMUSCULAR | Status: AC
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
## 2021-09-07 NOTE — ED Notes (Signed)
This nurse attempted to call patient's brother Hazael Olveda, [phone] x [phone]. No answer, no option to leave voicemail

## 2021-09-07 NOTE — ED Notes (Signed)
Patient transported to MRI with NT

## 2021-09-07 NOTE — ED Notes (Signed)
[facility] has been contacted. Pt transport in process

## 2021-09-07 NOTE — ED Notes (Signed)
Patient continuing to take off equipment and leave. Cursing at staff and not redirectable.
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
## 2021-09-07 NOTE — ED Notes (Signed)
Patient transported to X-ray 

## 2021-09-07 NOTE — ED Notes (Signed)
EKG shown to Dr.Dixon. 

## 2021-09-07 NOTE — ED Notes (Signed)
Patient returns from MRI

## 2021-09-07 NOTE — ED Notes (Signed)
Patient is constantly talking in his room, rapidly changing topic. There is no one else in the room with him.
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
## 2021-09-07 NOTE — ED Notes (Signed)
MD at bedside. 

## 2021-09-07 NOTE — Assessment & Plan Note (Signed)
Noted elevated TSH continue Synthroid can increase dose to 50 mcg a day await results of T4 T3
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
## 2021-09-07 NOTE — ED Notes (Signed)
Patient ambulatory to the restroom with assistance.

## 2021-09-07 NOTE — ED Notes (Signed)
Report given to [facility] transport.
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
## 2021-09-07 NOTE — ED Notes (Signed)
Sitter at bedside.

## 2021-09-07 NOTE — Assessment & Plan Note (Addendum)
Status post thymectomy

## 2021-09-07 NOTE — ED Notes (Signed)
Patient continues to speak loudly about large range of subjects, very tangential in conversation. Patient at times will geet aggressive with his speech, then seems to calm self dorn. MD aware.

## 2021-09-07 NOTE — ED Notes (Signed)
Patient managed to pull out his IV.

## 2021-09-07 NOTE — Assessment & Plan Note (Addendum)
Appreciate neurology consult given history of thymoma possibility of Paraneoplastic syndrome. Order EEG Neurology to decide if needs steroids Treat underlying psychosis as per psychiatry Neurology request transfer to [facility] may need LP under sedation Check VBG coags panel, Ammonia level within normal limits MRI nonacute May need LP once able to tolerate Defer to neurology if needs EEG

## 2021-09-07 NOTE — ED Notes (Signed)
Patient still in the restroom, NT checking on patient.

## 2021-09-08 ENCOUNTER — Observation Stay (HOSPITAL_COMMUNITY): Payer: Medicare HMO

## 2021-09-08 DIAGNOSIS — Z800 Family history of malignant neoplasm of digestive organs: Secondary | ICD-10-CM | POA: Diagnosis not present

## 2021-09-08 DIAGNOSIS — Z9183 Wandering in diseases classified elsewhere: Secondary | ICD-10-CM | POA: Diagnosis not present

## 2021-09-08 DIAGNOSIS — E785 Hyperlipidemia, unspecified: Secondary | ICD-10-CM | POA: Diagnosis not present

## 2021-09-08 DIAGNOSIS — R69 Illness, unspecified: Secondary | ICD-10-CM | POA: Diagnosis not present

## 2021-09-08 DIAGNOSIS — G9340 Encephalopathy, unspecified: Secondary | ICD-10-CM | POA: Diagnosis not present

## 2021-09-08 DIAGNOSIS — Z20822 Contact with and (suspected) exposure to covid-19: Secondary | ICD-10-CM | POA: Diagnosis not present

## 2021-09-08 DIAGNOSIS — Z79890 Hormone replacement therapy (postmenopausal): Secondary | ICD-10-CM | POA: Diagnosis not present

## 2021-09-08 DIAGNOSIS — E039 Hypothyroidism, unspecified: Secondary | ICD-10-CM | POA: Diagnosis not present

## 2021-09-08 DIAGNOSIS — Z823 Family history of stroke: Secondary | ICD-10-CM | POA: Diagnosis not present

## 2021-09-08 DIAGNOSIS — Z833 Family history of diabetes mellitus: Secondary | ICD-10-CM | POA: Diagnosis not present

## 2021-09-08 DIAGNOSIS — C37 Malignant neoplasm of thymus: Secondary | ICD-10-CM | POA: Diagnosis not present

## 2021-09-08 DIAGNOSIS — Z923 Personal history of irradiation: Secondary | ICD-10-CM | POA: Diagnosis not present

## 2021-09-08 DIAGNOSIS — G9341 Metabolic encephalopathy: Secondary | ICD-10-CM | POA: Diagnosis not present

## 2021-09-08 DIAGNOSIS — R4182 Altered mental status, unspecified: Secondary | ICD-10-CM | POA: Diagnosis not present

## 2021-09-08 DIAGNOSIS — Z510 Encounter for antineoplastic radiation therapy: Secondary | ICD-10-CM | POA: Diagnosis not present

## 2021-09-08 DIAGNOSIS — F17210 Nicotine dependence, cigarettes, uncomplicated: Secondary | ICD-10-CM | POA: Diagnosis not present

## 2021-09-08 DIAGNOSIS — N1832 Chronic kidney disease, stage 3b: Secondary | ICD-10-CM | POA: Diagnosis not present

## 2021-09-08 DIAGNOSIS — F23 Brief psychotic disorder: Secondary | ICD-10-CM | POA: Diagnosis not present

## 2021-09-08 LAB — COMPREHENSIVE METABOLIC PANEL
ALT: 14 U/L (ref 0–44)
AST: 17 U/L (ref 15–41)
Albumin: 3.7 g/dL (ref 3.5–5.0)
Alkaline Phosphatase: 62 U/L (ref 38–126)
Anion gap: 10 (ref 5–15)
BUN: 9 mg/dL (ref 8–23)
CO2: 22 mmol/L (ref 22–32)
Calcium: 9.5 mg/dL (ref 8.9–10.3)
Chloride: 106 mmol/L (ref 98–111)
Creatinine, Ser: 1.34 mg/dL — ABNORMAL HIGH (ref 0.61–1.24)
GFR, Estimated: 58 mL/min — ABNORMAL LOW (ref >60)
Glucose, Bld: 86 mg/dL (ref 70–99)
Potassium: 3.6 mmol/L (ref 3.5–5.1)
Sodium: 138 mmol/L (ref 135–145)
Total Bilirubin: 0.8 mg/dL (ref 0.3–1.2)
Total Protein: 6.7 g/dL (ref 6.5–8.1)

## 2021-09-08 LAB — CK: Total CK: 187 U/L (ref 49–397)

## 2021-09-08 LAB — BLOOD GAS, VENOUS
Acid-Base Excess: 0.3 mmol/L (ref 0.0–2.0)
Bicarbonate: 26.5 mmol/L (ref 20.0–28.0)
Drawn by: 64037
O2 Saturation: 78.3 %
Patient temperature: 36.8
pCO2, Ven: 48 mmHg (ref 44–60)
pH, Ven: 7.35 (ref 7.25–7.43)
pO2, Ven: 45 mmHg (ref 32–45)

## 2021-09-08 LAB — CBC
HCT: 44.1 % (ref 39.0–52.0)
Hemoglobin: 14.8 g/dL (ref 13.0–17.0)
MCH: 29.5 pg (ref 26.0–34.0)
MCHC: 33.6 g/dL (ref 30.0–36.0)
MCV: 88 fL (ref 80.0–100.0)
Platelets: 135 10*3/uL — ABNORMAL LOW (ref 150–400)
RBC: 5.01 MIL/uL (ref 4.22–5.81)
RDW: 14.2 % (ref 11.5–15.5)
WBC: 6.1 10*3/uL (ref 4.0–10.5)
nRBC: 0 % (ref 0.0–0.2)

## 2021-09-08 LAB — IRON AND TIBC
Iron: 117 ug/dL (ref 45–182)
Saturation Ratios: 33 % (ref 17.9–39.5)
TIBC: 357 ug/dL (ref 250–450)
UIBC: 240 ug/dL

## 2021-09-08 LAB — COOXEMETRY PANEL
Carboxyhemoglobin: 0.3 % — ABNORMAL LOW (ref 0.5–1.5)
Methemoglobin: <0.7 % (ref 0.0–1.5)
O2 Saturation: 17.9 %
Total hemoglobin: 14.9 g/dL (ref 12.0–16.0)

## 2021-09-08 LAB — HIV ANTIBODY (ROUTINE TESTING W REFLEX): HIV Screen 4th Generation wRfx: NONREACTIVE

## 2021-09-08 LAB — C-REACTIVE PROTEIN: CRP: 0.5 mg/dL (ref <1.0)

## 2021-09-08 LAB — FOLATE: Folate: 7.7 ng/mL (ref >5.9)

## 2021-09-08 LAB — PROTIME-INR
INR: 1.1 (ref 0.8–1.2)
Prothrombin Time: 13.6 seconds (ref 11.4–15.2)

## 2021-09-08 LAB — LACTIC ACID, PLASMA
Lactic Acid, Venous: 0.9 mmol/L (ref 0.5–1.9)
Lactic Acid, Venous: 3.4 mmol/L (ref 0.5–1.9)

## 2021-09-08 LAB — PHOSPHORUS: Phosphorus: 3.5 mg/dL (ref 2.5–4.6)

## 2021-09-08 LAB — SEDIMENTATION RATE: Sed Rate: 1 mm/hr (ref 0–16)

## 2021-09-08 LAB — VITAMIN B12: Vitamin B-12: 156 pg/mL — ABNORMAL LOW (ref 180–914)

## 2021-09-08 LAB — RPR: RPR Ser Ql: NONREACTIVE

## 2021-09-08 LAB — FERRITIN: Ferritin: 22 ng/mL — ABNORMAL LOW (ref 24–336)

## 2021-09-08 MED ORDER — SODIUM CHLORIDE 0.9 % IV SOLN: INTRAVENOUS | Status: AC

## 2021-09-08 MED ORDER — LEVOTHYROXINE SODIUM 75 MCG PO TABS
75.0000 ug | ORAL_TABLET | Freq: Every day | ORAL | Status: DC
  Administered 2021-09-09: 75 ug via ORAL
  Filled 2021-09-08: qty 1

## 2021-09-08 MED ORDER — CYANOCOBALAMIN 1000 MCG/ML IJ SOLN
1000.0000 ug | Freq: Every day | INTRAMUSCULAR | Status: DC
  Administered 2021-09-08 – 2021-09-09 (×2): 1000 ug via INTRAMUSCULAR
  Filled 2021-09-08 (×2): qty 1

## 2021-09-08 MED ORDER — NICOTINE 21 MG/24HR TD PT24
21.0000 mg | MEDICATED_PATCH | Freq: Every day | TRANSDERMAL | Status: DC
  Administered 2021-09-08 – 2021-09-09 (×2): 21 mg via TRANSDERMAL
  Filled 2021-09-08 (×2): qty 1

## 2021-09-08 MED ORDER — ACETAMINOPHEN 650 MG RE SUPP: 650.0000 mg | Freq: Four times a day (QID) | RECTAL | Status: DC | PRN

## 2021-09-08 MED ORDER — ACETAMINOPHEN 325 MG PO TABS: 650.0000 mg | ORAL_TABLET | Freq: Four times a day (QID) | ORAL | Status: DC | PRN

## 2021-09-08 NOTE — Consult Note (Signed)
NEUROLOGY CONSULTATION NOTE   Date of service: September 08, 2021 Patient Name: Antonio Scott MRN:  [id number] DOB:  [birth year] Reason for consult: "Psychosis in the setting of diagnosis of thymoma" Requesting Provider: Toy Baker, MD _ _ _   _ __   _ __ _ _  __ __   _ __   __ _  History of Present Illness  Antonio Scott is a 66 y.o. 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  He presents today with concern for psychosis. He initially presented to [facility]. He tells me that he was going to [employer] for maintenance on his truck and lost his way. Ended up in the hospital parking lot and was interested in seeing how the hospital looks on a weekend. 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 Reports he makde some political jokes and that is how he ended up in the ED.  In the ED, was noted to be talking about random subjects, at times talking to himself and talking loud about a range of subjects per documentation.  On my evaluation, he started talking about himself and how he got here, then talking about his brother and how his brother does not want him to talk about business at home. Switches over to talking about cell phones and how he has consumer cellular and his brother has [employer] and therefore he cant talk to his brother. Then started talking about how the cell phone companies are monitoring [place] and how the big companies are taking over the world. Unclear if tangential thought process or just that he is odd.  Does not drink alcohol, denies using recreational substances, endorses poor eating habits.  He is awake, alert, oriented x 3 and able to do calculations.   ROS   Constitutional Denies weight loss, fever and chills.   HEENT Denies changes in vision and hearing.   Respiratory Denies SOB and cough.   CV  Denies palpitations and CP   GI Denies abdominal pain, nausea, vomiting and diarrhea.   GU Denies dysuria and urinary frequency.   MSK Denies myalgia and joint pain.   Skin Denies rash and pruritus.   Neurological Denies headache and syncope.   Psychiatric Denies recent changes in mood. Denies anxiety and depression.    Past History   Past Medical History:  Diagnosis Date   Anxiety    Hypothyroidism    Past Surgical History:  Procedure Laterality Date   INTERCOSTAL NERVE BLOCK Right 06/17/2021   Procedure: INTERCOSTAL NERVE BLOCK;  Surgeon: Lajuana Matte, MD;  Location: [facility] OR;  Service: Thoracic;  Laterality: Right;   SKIN LESION EXCISION     Family History  Problem Relation Age of Onset   Stroke Mother    Diabetes Mother    Cancer Father    Cancer Brother    Liver cancer Maternal Aunt    Colon cancer Paternal Grandfather    Social History   Socioeconomic History   Marital status: Single    Spouse name: Not on file   Number of children: 0   Years of education: Not on file   Highest education level: Not on file  Occupational History   Occupation: retired  Tobacco Use   Smoking status: Every Day    Packs/day: 0.20    Years: 50.00    Total  pack years: 10.00    Types: Cigarettes   Smokeless tobacco: Never   Tobacco comments:    Currently smoking 7-10 cig daily  Vaping Use   Vaping Use: Never used  Substance and Sexual Activity   Alcohol use: Not Currently   Drug use: Not Currently   Sexual activity: Not Currently  Other Topics Concern   Not on file  Social History Narrative   No children   Brother lives a mile away   Social Determinants of Health   Financial Resource Strain: Low Risk  (02/25/2021)   Overall Financial Resource Strain (CARDIA)    Difficulty of Paying Living Expenses: Not hard at all  Food Insecurity: No Food Insecurity (02/25/2021)   Hunger Vital Sign    Worried About Running Out of Food in the Last Year: Never true    Ran Out of  Food in the Last Year: Never true  Transportation Needs: No Transportation Needs (02/25/2021)   PRAPARE - Hydrologist (Medical): No    Lack of Transportation (Non-Medical): No  Physical Activity: Sufficiently Active (02/25/2021)   Exercise Vital Sign    Days of Exercise per Week: 6 days    Minutes of Exercise per Session: 30 min  Stress: No Stress Concern Present (02/25/2021)   [place]    Feeling of Stress : Only a little  Social Connections: Socially Isolated (02/25/2021)   Social Connection and Isolation Panel [NHANES]    Frequency of Communication with Friends and Family: Twice a week    Frequency of Social Gatherings with Friends and Family: Once a week    Attends Religious Services: Never    Marine scientist or Organizations: No    Attends Music therapist: Never    Marital Status: Never married   No Known Allergies  Medications   Medications Prior to Admission  Medication Sig Dispense Refill Last Dose   levothyroxine (SYNTHROID) 25 MCG tablet Take 1 tablet (25 mcg total) by mouth daily. 90 tablet 3    nicotine polacrilex (COMMIT) 2 MG lozenge Take 2 mg by mouth as needed for smoking cessation.        Vitals   Vitals:   09/07/21 2045 09/07/21 2130 09/08/21 0013 09/08/21 0112  BP: (!) 128/92 (!) 135/114 (!) 133/101 119/89  Pulse: 85 99 89   Resp: 17 (!) 21 20   Temp:   97.6 F (36.4 C)   TempSrc:      SpO2: 99% 100% 100%   Weight:      Height:         Body mass index is 22.37 kg/m.  Physical Exam   General: Laying comfortably in bed; in no acute distress.  HENT: Normal oropharynx and mucosa. Normal external appearance of ears and nose.  Neck: Supple, no pain or tenderness  CV: No JVD. No peripheral edema.  Pulmonary: Symmetric Chest rise. Normal respiratory effort.  Abdomen: Soft to touch, non-tender.  Ext: No cyanosis, edema, or  deformity Skin: No rash. Normal palpation of skin.   Musculoskeletal: Normal digits and nails by inspection. No clubbing.   Neurologic Examination  Mental status/Cognition: Alert, oriented to self, place, month and year, good attention.  Speech/language: Fluent, comprehension intact, object naming intact, repetition intact.  Cranial nerves:   CN II Pupils equal and reactive to light, no VF deficits    CN III,IV,VI EOM intact, no gaze preference or deviation, no nystagmus  CN V normal sensation in V1, V2, and V3 segments bilaterally   CN VII no asymmetry, no nasolabial fold flattening    CN VIII normal hearing to speech    CN IX & X normal palatal elevation, no uvular deviation    CN XI 5/5 head turn and 5/5 shoulder shrug bilaterally    CN XII midline tongue protrusion    Motor:  Muscle bulk: normal, tone normal, pronator drift none tremor none Mvmt Root Nerve  Muscle Right Left Comments  SA C5/6 Ax Deltoid 5 5   EF C5/6 Mc Biceps 5 5   EE C6/7/8 Rad Triceps 5 5   WF C6/7 Med FCR     WE C7/8 PIN ECU     F Ab C8/T1 U ADM/FDI 5 5   HF L1/2/3 Fem Illopsoas 5 5   KE L2/3/4 Fem Quad 5 5   DF L4/5 D Peron Tib Ant 5 5   PF S1/2 Tibial Grc/Sol 5 5    Reflexes:  Right Left Comments  Pectoralis      Biceps (C5/6) 2 2   Brachioradialis (C5/6) 2 2    Triceps (C6/7) 2 2    Patellar (L3/4) 2 2    Achilles (S1)      Hoffman      Plantar     Jaw jerk    Sensation:  Light touch Intact throughout   Pin prick    Temperature    Vibration   Proprioception    Coordination/Complex Motor:  - Finger to Nose intact BL - Heel to shin intact BL - Rapid alternating movement are normal - Gait: deferred.  Labs   CBC:  Recent Labs  Lab 09/07/21 1429  WBC 6.9  NEUTROABS 5.8  HGB 14.9  HCT 44.8  MCV 89.1  PLT 950    Basic Metabolic Panel:  Lab Results  Component Value Date   NA 140 09/07/2021   K 3.9 09/07/2021   CO2 22 09/07/2021   GLUCOSE 108 (H) 09/07/2021   BUN 15  09/07/2021   CREATININE 1.52 (H) 09/07/2021   CALCIUM 9.4 09/07/2021   GFRNONAA 50 (L) 09/07/2021   Lipid Panel:  Lab Results  Component Value Date   LDLCALC 78 08/20/2021   HgbA1c:  Lab Results  Component Value Date   HGBA1C 5.3 08/20/2021   Urine Drug Screen:     Component Value Date/Time   LABOPIA NONE DETECTED 09/07/2021 2145   COCAINSCRNUR NONE DETECTED 09/07/2021 2145   LABBENZ NONE DETECTED 09/07/2021 2145   AMPHETMU NONE DETECTED 09/07/2021 2145   THCU NONE DETECTED 09/07/2021 2145   LABBARB NONE DETECTED 09/07/2021 2145    Alcohol Level     Component Value Date/Time   ETH <10 09/07/2021 1416    CT Head without contrast(Personally reviewed): CTH was negative for a large hypodensity concerning for a large territory infarct or hyperdensity concerning for an ICH  MRI Brain(Personally reviewed): 1. No acute intracranial abnormality. 2. Advanced atrophy and chronic microvascular ischemia. 3. Old left PCA territory infarct.  rEEG:  pending  Impression   Antonio Scott is a 66 y.o. 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 He presents today with concern for psychosis. Do have some suspicion for tangential thought process vs just being odd. He does seem to connect and transition between subjects of conversation fairly well. I would appreciate psychiatry teams input  on this case to see if they think this is new onset psychosis.   Thymomas are associated with several paraneoplastic disorders and would consider treating with IV solumedrol or other immunosuppression if there is concern about new onset psychosis.  I was unable to get in touch with his brother for colateral history.  In addition, will get routine EEG, thiamine, folate, B12, RPR, HIV.  Recommendations  - Recommend psych consult for concern for new onset psychosis. Thymomas  are associated with several paraneoplastic disorders and would consider treating with IV solumedrol or other immunosuppression if there is concern about new onset psychosis. - routine EEG, folate, TSH, B12, Thiamine, HIV, RPR, UDS, EtOH. - Start thiamine at wernicke's dosing once thiamine levels have been collected. - Further workup pending above. ______________________________________________________________________   Thank you for the opportunity to take part in the care of this patient. If you have any further questions, please contact the neurology consultation attending.  Signed,  [place] Pager Number [phone] _ _ _   _ __   _ __ _ _  __ __   _ __   __ _

## 2021-09-08 NOTE — Evaluation (Signed)
Physical Therapy Evaluation Patient Details Name: ERCEL PEPITONE MRN: [id number] DOB: [birth year] Today's Date: 09/08/2021  History of Present Illness  The pt is a 66 yo male presenting 8/5 with AMS. Undergoing work up for psychosis, MRI shows o acute abnormality. PMH includes: HLD, hypothyroidism, tobacco use, and anxiety.   Clinical Impression  Pt in bed upon arrival of PT, agreeable to evaluation at this time. Prior to admission the pt was independent with all mobility without use of DME, reports living alone in a home with 2 steps to enter, and was still driving for all errands and needs. The pt now presents with minor limitations in functional mobility, dynamic stability, and safety awareness due to above dx, and will continue to benefit from skilled PT to address these deficits. The pt was able to complete sit-stand transfers with minG for safety, and completed hallway ambulation without UE support or assist, even with addition of dynamic balance challenge. The pt does present with mild instability with addition of challenge such as stepping over objects or ambulation with narrow BOS. Will maintain on caseload acutely to address balance deficits, but anticipate pt will be able to return home with intermittent supervision from family and neighbors.   Dynamic Gait Index (DGI): 18/24 (<19 indicates increased risk for falls)     Recommendations for follow up therapy are one component of a multi-disciplinary discharge planning process, led by the attending physician.  Recommendations may be updated based on patient status, additional functional criteria and insurance authorization.  Follow Up Recommendations No PT follow up      Assistance Recommended at Discharge Intermittent Supervision/Assistance  Patient can return home with the following  Assist for transportation;Help with stairs or ramp for entrance;Direct supervision/assist for medications management;Direct supervision/assist for  financial management    Equipment Recommendations None recommended by PT  Recommendations for Other Services       Functional Status Assessment Patient has had a recent decline in their functional status and demonstrates the ability to make significant improvements in function in a reasonable and predictable amount of time.     Precautions / Restrictions Precautions Precautions: Fall Restrictions Weight Bearing Restrictions: No      Mobility  Bed Mobility Overal bed mobility: Modified Independent                  Transfers Overall transfer level: Needs assistance Equipment used: None Transfers: Sit to/from Stand Sit to Stand: Min guard           General transfer comment: minG for safety    Ambulation/Gait Ambulation/Gait assistance: Min guard Gait Distance (Feet): 150 Feet Assistive device: None Gait Pattern/deviations: Step-through pattern Gait velocity: decrased Gait velocity interpretation: 1.31 - 2.62 ft/sec, indicative of limited community ambulator   General Gait Details: pt with slightly slowed speed, no overt LOB even with balance challenges. mildly unsteady  Stairs Stairs: Yes Stairs assistance: Min guard Stair Management: One rail Right, No rails, Alternating pattern, Step to pattern, Forwards Number of Stairs: 4 (x2) General stair comments: first with single UE support, then withno UE support. step-to descending without rail    Balance Overall balance assessment: Mild deficits observed, not formally tested                   Tandem Stance - Right Leg: 15 Tandem Stance - Left Leg: 10 Rhomberg - Eyes Opened: 15 Rhomberg - Eyes Closed: 15 High level balance activites: Backward walking, Direction changes, Turns, Sudden stops, Head turns (tandem walking)  High Level Balance Comments: increased instability with narrow BOS, no overt LOB Standardized Balance Assessment Standardized Balance Assessment : Dynamic Gait Index   Dynamic Gait  Index Level Surface: Normal Change in Gait Speed: Normal Gait with Horizontal Head Turns: Mild Impairment Gait with Vertical Head Turns: Mild Impairment Gait and Pivot Turn: Mild Impairment Step Over Obstacle: Mild Impairment Step Around Obstacles: Mild Impairment Steps: Mild Impairment Total Score: 18       Pertinent Vitals/Pain Pain Assessment Pain Assessment: No/denies pain Pain Intervention(s): Monitored during session    Home Living Family/patient expects to be discharged to:: Private residence Living Arrangements: Alone Available Help at Discharge: Family;Available PRN/intermittently Type of Home: House Home Access: Stairs to enter   [employer] of Steps: 4-5 (front)   Home Layout: One level Home Equipment: Other (comment) Additional Comments: uses walking stick every once in a while    Prior Function Prior Level of Function : Independent/Modified Independent             Mobility Comments: no AD\ ADLs Comments: independent ADL/iADL     Hand Dominance   Dominant Hand: Right    Extremity/Trunk Assessment   Upper Extremity Assessment Upper Extremity Assessment: Defer to OT evaluation;Overall [facility] for tasks assessed    Lower Extremity Assessment Lower Extremity Assessment: Overall WFL for tasks assessed    Cervical / Trunk Assessment Cervical / Trunk Assessment: Normal  Communication   Communication: No difficulties  Cognition Arousal/Alertness: Awake/alert Behavior During Therapy: WFL for tasks assessed/performed Overall Cognitive Status: No family/caregiver present to determine baseline cognitive functioning Area of Impairment: Problem solving                             Problem Solving: Slow processing, Difficulty sequencing, Requires verbal cues General Comments: pt at times needing increased cues to time to complete tasks, no family present to determine if baseline        General Comments General comments (skin  integrity, edema, etc.): HR 110-124bpm        Assessment/Plan    PT Assessment Patient needs continued PT services  PT Problem List Decreased activity tolerance;Decreased balance;Decreased safety awareness       PT Treatment Interventions Gait training;DME instruction;Stair training;Functional mobility training;Therapeutic activities;Therapeutic exercise;Balance training;Patient/family education    PT Goals (Current goals can be found in the Care Plan section)  Acute Rehab PT Goals Patient Stated Goal: return home asap PT Goal Formulation: With patient Time For Goal Achievement: 09/22/21 Potential to Achieve Goals: Good    Frequency Min 3X/week        AM-PAC PT "6 Clicks" Mobility  Outcome Measure Help needed turning from your back to your side while in a flat bed without using bedrails?: None Help needed moving from lying on your back to sitting on the side of a flat bed without using bedrails?: A Little Help needed moving to and from a bed to a chair (including a wheelchair)?: A Little Help needed standing up from a chair using your arms (e.g., wheelchair or bedside chair)?: A Little Help needed to walk in hospital room?: A Little Help needed climbing 3-5 steps with a railing? : A Little 6 Click Score: 19    End of Session Equipment Utilized During Treatment: Gait belt Activity Tolerance: Patient tolerated treatment well Patient left: in chair;with call bell/phone within reach;with chair alarm set Nurse Communication: Mobility status PT Visit Diagnosis: Other abnormalities of gait and mobility (R26.89)  Time: 8241-7530 PT Time Calculation (min) (ACUTE ONLY): 14 min   Charges:   PT Evaluation $PT Eval Low Complexity: 1 Low          [place], PT, DPT   Acute Rehabilitation Department  Sandra Cockayne 09/08/2021, 8:54 AM

## 2021-09-08 NOTE — Progress Notes (Signed)
EEG complete - results pending 

## 2021-09-08 NOTE — Progress Notes (Signed)
PROGRESS NOTE    [name]  BSW:[id number] DOB: 1955/12/04 DOA: 09/07/2021 PCP: Baruch Gouty, FNP    Brief Narrative:  66 year old with history of anxiety, thymoma s/p resection and currently on thyroxine 50 mcg daily undergoing radiation therapy found in the [place] long hospital hallway lost his way and with tangential thoughts.  He was seen at [facility] long emergency room, hemodynamically stable, unsafe due to psychosis and transferred to [facility] for neurology evaluation.   Assessment & Plan:   Psychosis: Confusion and disorientation.  No obvious aggravating factors. -Routine EEG, no evidence of seizure. -TSH more than 10, inadequate thyroxine replacement, increase dose to 75 mcg daily. -B12 borderline low, folic acid is normal.  Electrolytes are normal.  Will replace aggressively while in the hospital and keep on replacement. -HIV negative, COVID-19 and influenza negative, UDS negative for common substances. Followed by neurology.  Unsure about the cause.  Patient currently involuntarily committed because of confusion, psychiatry to evaluate today. -As needed Risperdal for agitation, however patient is easily redirectable.  Acquired hypothyroidism: As above  Thymoma: Status post thymectomy.   DVT prophylaxis: SCDs Start: 09/08/21 0021   Code Status: Full code Family Communication: Unable.  His brother does not pick up the phone. Disposition Plan: Status is: Observation The patient will require care spanning > 2 midnights and should be moved to inpatient because: Acute psychosis.     Consultants:  Neurology Psychiatry  Procedures:  None  Antimicrobials:  None   Subjective: Patient seen and examined in the morning rounds.  Fairly interactive but has some tangential thoughts. He tells me that he is very good and making jokes, he can do political jokes but wants to stay away from them.  "Sometimes they will take it very differently when you make political or  religious jokes".  He tells me he lives by himself.  He tells me his brother is not in good terms with him however he is only 1 family member alive. Patient tells me he is agreeable to stay in the hospital for his safety and plans to go home when we let him go home.  Objective: Vitals:   09/08/21 0013 09/08/21 0112 09/08/21 0353 09/08/21 0851  BP: (!) 133/101 119/89 110/83 121/78  Pulse: 89  75   Resp: '20  18 16  '$ Temp: 97.6 F (36.4 C)  98.5 F (36.9 C) 98.2 F (36.8 C)  TempSrc:   Oral Oral  SpO2: 100%  100% 100%  Weight:      Height:       No intake or output data in the 24 hours ending 09/08/21 1123 Filed Weights   09/07/21 1407  Weight: 72.8 kg    Examination:  General exam: Appears calm and comfortable  Respiratory system: Clear to auscultation. Respiratory effort normal. Cardiovascular system: S1 & S2 heard, RRR. No JVD, murmurs, rubs, gallops or clicks. No pedal edema. Gastrointestinal system: Abdomen is nondistended, soft and nontender. No organomegaly or masses felt. Normal bowel sounds heard. Central nervous system: Alert and oriented.  Does not remember going to [facility]. Psychiatry: Calm, comfortable, talkative.  Tangential thoughts.  Denies any delusions or hallucinations.  Denies any suicidal homicidal ideations. Normal speech.  Normal affect.    Data Reviewed: I have personally reviewed following labs and imaging studies  CBC: Recent Labs  Lab 09/07/21 1429 09/08/21 0445  WBC 6.9 6.1  NEUTROABS 5.8  --   HGB 14.9 14.8  HCT 44.8 44.1  MCV 89.1 88.0  PLT 160 400*   Basic Metabolic Panel: Recent Labs  Lab 09/07/21 1429 09/08/21 0445  NA 140 138  K 3.9 3.6  CL 109 106  CO2 22 22  GLUCOSE 108* 86  BUN 15 9  CREATININE 1.52* 1.34*  CALCIUM 9.4 9.5  MG 2.1  --   PHOS  --  3.5   GFR: Estimated Creatinine Clearance: 55.1 mL/min (A) (by C-G formula based on SCr of 1.34 mg/dL (H)). Liver Function Tests: Recent Labs  Lab  09/07/21 1429 09/08/21 0445  AST 18 17  ALT 18 14  ALKPHOS 70 62  BILITOT 0.6 0.8  PROT 7.9 6.7  ALBUMIN 4.4 3.7   No results for input(s): "LIPASE", "AMYLASE" in the last 168 hours. Recent Labs  Lab 09/07/21 1430  AMMONIA 17   Coagulation Profile: Recent Labs  Lab 09/08/21 0445  INR 1.1   Cardiac Enzymes: Recent Labs  Lab 09/08/21 0445  CKTOTAL 187   BNP (last 3 results) No results for input(s): "PROBNP" in the last 8760 hours. HbA1C: No results for input(s): "HGBA1C" in the last 72 hours. CBG: Recent Labs  Lab 09/07/21 1441  GLUCAP 88   Lipid Profile: No results for input(s): "CHOL", "HDL", "LDLCALC", "TRIG", "CHOLHDL", "LDLDIRECT" in the last 72 hours. Thyroid Function Tests: Recent Labs    09/07/21 2100  TSH 12.977*   Anemia Panel: Recent Labs    09/07/21 2100 09/08/21 0445  VITAMINB12  --  156*  FOLATE  --  7.7  FERRITIN  --  22*  TIBC  --  357  IRON  --  117  RETICCTPCT 0.8  --    Sepsis Labs: Recent Labs  Lab 09/08/21 0445 09/08/21 0948  LATICACIDVEN 0.9 3.4*    Recent Results (from the past 240 hour(s))  Resp Panel by RT-PCR (Flu A&B, Covid) Anterior Nasal Swab     Status: None   Collection Time: 09/07/21  9:38 PM   Specimen: Anterior Nasal Swab  Result Value Ref Range Status   SARS Coronavirus 2 by RT PCR NEGATIVE NEGATIVE Final    Comment: (NOTE) SARS-CoV-2 target nucleic acids are NOT DETECTED.  The SARS-CoV-2 RNA is generally detectable in upper respiratory specimens during the acute phase of infection. The lowest concentration of SARS-CoV-2 viral copies this assay can detect is 138 copies/mL. A negative result does not preclude SARS-Cov-2 infection and should not be used as the sole basis for treatment or other patient management decisions. A negative result may occur with  improper specimen collection/handling, submission of specimen other than nasopharyngeal swab, presence of viral mutation(s) within the areas targeted  by this assay, and inadequate number of viral copies(<138 copies/mL). A negative result must be combined with clinical observations, patient history, and epidemiological information. The expected result is Negative.  Fact Sheet for Patients:  EntrepreneurPulse.com.au  Fact Sheet for Healthcare Providers:  IncredibleEmployment.be  This test is no t yet approved or cleared by the [place] FDA and  has been authorized for detection and/or diagnosis of SARS-CoV-2 by FDA under an Emergency Use Authorization (EUA). This EUA will remain  in effect (meaning this test can be used) for the duration of the COVID-19 declaration under Section 564(b)(1) of the Act, 21 U.S.C.section 360bbb-3(b)(1), unless the authorization is terminated  or revoked sooner.       Influenza A by PCR NEGATIVE NEGATIVE Final   Influenza B by PCR NEGATIVE NEGATIVE Final    Comment: (NOTE) The Xpert Xpress SARS-CoV-2/FLU/RSV plus assay is intended as  an aid in the diagnosis of influenza from Nasopharyngeal swab specimens and should not be used as a sole basis for treatment. Nasal washings and aspirates are unacceptable for Xpert Xpress SARS-CoV-2/FLU/RSV testing.  Fact Sheet for Patients: EntrepreneurPulse.com.au  Fact Sheet for Healthcare Providers: IncredibleEmployment.be  This test is not yet approved or cleared by the [place] FDA and has been authorized for detection and/or diagnosis of SARS-CoV-2 by FDA under an Emergency Use Authorization (EUA). This EUA will remain in effect (meaning this test can be used) for the duration of the COVID-19 declaration under Section 564(b)(1) of the Act, 21 U.S.C. section 360bbb-3(b)(1), unless the authorization is terminated or revoked.  Performed at [facility], [place] [address], [place], [place] [address]          Radiology Studies: EEG adult  Result Date:  09/08/2021 Lora Havens, MD     09/08/2021  7:49 AM Patient Name: Antonio Scott MRN: [id number] Epilepsy Attending: Lora Havens Referring Physician/Provider: Donnetta Simpers, MD Date: 09/08/2021 Duration: 22.42 mins Patient history: 66 y.o. 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 He presents today with concern for psychosis. EEG to evaluate for seizure. Level of alertness: Awake, asleep AEDs during EEG study: None Technical aspects: This EEG study was done with scalp electrodes positioned according to the 10-20 International system of electrode placement. Electrical activity was reviewed with band pass filter of 1-'70Hz'$ , sensitivity of 7 uV/mm, display speed of 34m/sec with a '60Hz'$  notched filter applied as appropriate. EEG data were recorded continuously and digitally stored.  Video monitoring was available and reviewed as appropriate. Description: The posterior dominant rhythm consists of 8 Hz activity of moderate voltage (25-35 uV) seen predominantly in posterior head regions, symmetric and reactive to eye opening and eye closing. Sleep was characterized by vertex waves, sleep spindles (12 to 14 Hz), maximal frontocentral region.  Hyperventilation and photic stimulation were not performed.   IMPRESSION: This study is within normal limits. No seizures or epileptiform discharges were seen throughout the recording. A normal interictal EEG does not exclude nor support the diagnosis of epilepsy. PLora Havens  MR BRAIN W WO CONTRAST  Result Date: 09/07/2021 CLINICAL DATA:  Altered mental status EXAM: MRI HEAD WITHOUT AND WITH CONTRAST TECHNIQUE: Multiplanar, multiecho pulse sequences of the brain and surrounding structures were obtained without and with intravenous contrast. CONTRAST:  773mGADAVIST GADOBUTROL 1 MMOL/ML IV SOLN COMPARISON:  None Available. FINDINGS: Brain:  No acute infarct, mass effect or extra-axial collection. No acute or chronic hemorrhage. There is multifocal hyperintense T2-weighted signal within the white matter. There is advanced atrophy. Old left PCA territory infarct. The midline structures are normal. There is no abnormal contrast enhancement. Vascular: Major flow voids are preserved. Skull and upper cervical spine: Normal calvarium and skull base. Visualized upper cervical spine and soft tissues are normal. Sinuses/Orbits:No paranasal sinus fluid levels or advanced mucosal thickening. No mastoid or middle ear effusion. Normal orbits. IMPRESSION: 1. No acute intracranial abnormality. 2. Advanced atrophy and chronic microvascular ischemia. 3. Old left PCA territory infarct. Electronically Signed   By: KeUlyses Jarred.D.   On: 09/07/2021 19:30   DG Abdomen 1 View  Result Date: 09/07/2021 CLINICAL DATA:  Screening for MR EXAM: ABDOMEN - 1 VIEW COMPARISON:  None Available. FINDINGS: The bowel gas pattern is normal. No radio-opaque calculi or other significant radiographic abnormality are  seen. Incomplete assessment of the lateral soft tissues of the proximal LEFT thigh. No unexpected radiopaque foreign body of the visualized abdomen and pelvis. IMPRESSION: No unexpected radiopaque foreign body of the visualized abdomen and pelvis. Electronically Signed   By: Valentino Saxon M.D.   On: 09/07/2021 16:57   DG Chest Portable 1 View  Result Date: 09/07/2021 CLINICAL DATA:  MR screening EXAM: PORTABLE CHEST 1 VIEW COMPARISON:  July 26, 2021 FINDINGS: The cardiomediastinal silhouette is unchanged in contour.Biapical pleuroparenchymal thickening, similar comparison to prior. No pleural effusion. No pneumothorax. No acute pleuroparenchymal abnormality. Visualized abdomen is unremarkable. Known anterior mediastinal mass is better assessed on prior cross-sectional imaging. No unexpected radiopaque foreign body in the visualized thorax. IMPRESSION: No unexpected  radiopaque foreign body in the visualized thorax. Electronically Signed   By: Valentino Saxon M.D.   On: 09/07/2021 16:56   CT HEAD WO CONTRAST  Result Date: 09/07/2021 CLINICAL DATA:  Mental status change. EXAM: CT HEAD WITHOUT CONTRAST TECHNIQUE: Contiguous axial images were obtained from the base of the skull through the vertex without intravenous contrast. RADIATION DOSE REDUCTION: This exam was performed according to the departmental dose-optimization program which includes automated exposure control, adjustment of the mA and/or kV according to patient size and/or use of iterative reconstruction technique. COMPARISON:  None Available. FINDINGS: Brain: Small area of hypoattenuation in the right temporal lobe. Moderate area of encephalomalacia in the left occipital lobe. Patchy hypoattenuation of the deep white matter bilaterally. Symmetrically prominent ventricles. No evidence of intracranial hemorrhage or mass effect. Vascular: No hyperdense vessel or unexpected calcification. Skull: Normal. Negative for fracture or focal lesion. Sinuses/Orbits: No acute finding. Other: None. IMPRESSION: 1. Small area of hypoattenuation in the right temporal lobe, which may represent an age-indeterminate infarct. 2. Moderate area of encephalomalacia in the left occipital lobe may be due to prior infarction. 3. Patchy hypoattenuation of the deep white matter bilaterally, likely due to small vessel disease. 4. Symmetrically prominent ventricles, likely due to brain parenchymal volume loss. 5. Recommend comparison to prior studies. If no prior studies are available, then further evaluation with MRI of the brain may be considered. Electronically Signed   By: Fidela Salisbury M.D.   On: 09/07/2021 15:07        Scheduled Meds:  cyanocobalamin  1,000 mcg Intramuscular Q0600   [START ON 09/09/2021] levothyroxine  75 mcg Oral QAC breakfast   nicotine  21 mg Transdermal Daily   Continuous Infusions:  sodium chloride        LOS: 0 days    Time spent: 35 minutes    Barb Merino, MD [facility] Pager [phone]

## 2021-09-08 NOTE — Consult Note (Signed)
66 year old with history of anxiety, thymoma s/p resection and currently on thyroxine 50 mcg daily undergoing radiation therapy found in the [place] long hospital hallway lost his way and with tangential thoughts.  He was seen at [facility] long emergency room, hemodynamically stable, unsafe due to psychosis and transferred to [facility] for neurology evaluation.  Psych consult placed for new onset psychosis.  Current recommendations first inpatient psychiatric consult from neurology, who request psychiatry to evaluate patient today for new onset psychosis.  Patient is seen and assessed by this psychiatric nurse practitioner.  He is observed to be sitting in bedside chair, having a typical conversation with his [employer] crew.  He does acknowledge my entry into the room, and offers a seat on several occasions.  Introduce myself and reason for today's visit, and patient does agree to participate in psychiatric evaluation.   On evaluation patient is alert and oriented x4, calm and cooperative, very jovial and pleasant upon approach.  He does jokingly make a comment about his hair, noting that he is bald and excuse his presentation.  His speech is normal tone, normal volume, and normal pace.  Patient presents with casual appearance, good eye contact.  His speech is clear and coherent.  His mood is euthymic, and affect is congruent.  His thought process is coherent, goal directed, and linear.  He does present with circumstantiality, although he remains appropriate throughout psychiatric evaluation.  He further denies any depressive symptoms to include anhedonia, hopelessness, worthlessness, guilty, suicidal.  He does not present with derailment, flight of ideas, tangential, scattered thoughts, and or perseveration. He denies any acute symptoms of mania at this time to include impulsivity, grandiosity, mood lability, hypersexuality. His thought content remained logical and within normal limits throughout the  evaluation.  He further denies any acute psychiatric symptoms that include hallucinations, ideas of reference, first rank symptoms, persecutory delusions, paranoia.  He denies any suicidal thoughts and or homicidal thoughts; in addition to history of suicide attempts or homicidal attempts.  In terms of memory, he presents with good memory recall as he is noted to recall detailed information from 29s.  His judgment and insight appear to be normal.  And his executive functions appear to be within normal limits.  Patient denies any auditory and/or visual hallucinations, does not appear to be responding to internal or external stimuli.  There is no evidence of delusional thought content and patient appears to answer all questions appropriately.     On evaluation, He does not present with any of the above symptoms on this evaluation.  He does not appear to be displaying any or responding to internal stimuli, external stimuli, or exhibiting delusional thought disorder.   He does not currently have any outpatient psychiatric services, has not had a need to pursue outpatient psychiatric treatment at this time.     Pt denies ever been hospitalized for mental health concerns in the past.  He endorses isolated history of taking fluoxetine at the age of 9 for 6 months; and seeing a psychiatrist through employee assistance program.  He reports during this time he was working at [employer] third shift, 27 days on 1 day off.  He states he needed to see employee assistance, in order to receive additional time off of work.  In which he was granted additional days off during the month, and he took time as needed up until retirement.  Denies any previous history of suicidal thoughts, suicidal ideations, and or non suicidal self injurious behaviors. Pt  denies history of aggression, agitation, violent behavior, and or history of homicidal ideations/thoughts.  Patient further denies any current, previous legal charges.  Patient  further denies access to guns, weapons, or any engagement with the legal system.  Patient denies history of illicit substances to include synthetic substances, any cannabidiol, supplemental herbs.    Despite previous documentation of patient being confused, new onset psychosis as noted above patient does not present with any evidence of such during this psychiatric evaluation.  He remained appropriate throughout, answered all questions appropriately, and engage in conversation well.  With the exception of circumstantiality, which very well may be his baseline as patient lives history and talking, unable to say if his circumstantiality is related to psychosis.  As noted above patient did not experience any derailment, flight of ideas, or tangential thoughts during this psychiatric evaluation.  It is reasonable to say that patient may be experiencing fluctuate in cognitive functions and impairment.  Otherwise patient has no previous psychiatric history, and or symptoms leading up to that is suggestive of new onset psychosis.  -Recommend continuing further workup for diagnosis of thymoma.  -Patient with low B12 levels, suggest replacing as low B12 can cause worsening memory and cognitive impairment. -Continue of current recommendations by neurology. -At present time patient did not present with any symptoms suggestive for initiation of low-dose antipsychotic, however if the symptoms do returned recommend starting low-dose Seroquel 25 mg p.o. nightly.  EKG obtained yesterday; QTc within normal limits 402. -Patient does not appear to be a danger to himself and or others, does not meet inpatient criteria at this time.  Furthermore he does not meet criteria for [facility] involuntary commitment, as he is not a danger to himself currently.  -Psychiatry will sign off at this time.

## 2021-09-08 NOTE — Plan of Care (Signed)
Neurology plan of care  Please see neurology consult note from earlier this AM for full findings and recommendations. Awaiting psychiatry and input. Unable to get in touch with brother but when he is able to be contacted would be v helpful to get collateral hx regarding chronicity of patient's psychiatric issues.   Data  rEEG overnight WNL  MRI brain wwo 8/5 1. No acute intracranial abnormality. 2. Advanced atrophy and chronic microvascular ischemia. 3. Old left PCA territory infarct.  Further neurology recommendations tmrw after psychiatry input. If psychiatric sx are deemed acute will consider further workup incl LP for possible paraneoplastic syndrome in the setting of thymoma.  Su Monks, MD [facility] [phone]  If 7pm- 7am, please page neurology on call as listed in [place].

## 2021-09-08 NOTE — Progress Notes (Signed)
Patient arrived to the unit. No family at bedside. Oriented to room set up. Cardiac tele connected to CCMD. Call light within reach. Bed alarms on.

## 2021-09-08 NOTE — Evaluation (Signed)
Occupational Therapy Evaluation Patient Details Name: Antonio Scott MRN: [id number] DOB: [birth year] Today's Date: 09/08/2021   History of Present Illness The pt is a 66 yo male presenting 8/5 with AMS. Undergoing work up for psychosis, MRI shows o acute abnormality. PMH includes: HLD, hypothyroidism, tobacco use, and anxiety.   Clinical Impression   Pt PTA: Pt living alone, reports independence and is retired. Pt currently, Pt A/O x4 with context cues.  Pt slightlly tangential at times, but easily redirected and agreeable to therapy.Pt asked to find tooth brush and tooth paste- pt reading each label until finding the correct "shape" of toothpaste. Pt passing Short Blessed Test with no corrections. No family present to determine if baseline. Pt requiring minA for problem solving of grooming task. Pt supervisionA for OOB ADL tasks for lines and cognition. Pt would benefit from continued OT skilled services. OT following acutely.     Recommendations for follow up therapy are one component of a multi-disciplinary discharge planning process, led by the attending physician.  Recommendations may be updated based on patient status, additional functional criteria and insurance authorization.   Follow Up Recommendations  No OT follow up    Assistance Recommended at Discharge Set up Supervision/Assistance  Patient can return home with the following A little help with walking and/or transfers;Assist for transportation;Help with stairs or ramp for entrance    Functional Status Assessment  Patient has had a recent decline in their functional status and demonstrates the ability to make significant improvements in function in a reasonable and predictable amount of time.  Equipment Recommendations  None recommended by OT    Recommendations for Other Services       Precautions / Restrictions Precautions Precautions: Fall Restrictions Weight Bearing Restrictions: No      Mobility Bed  Mobility Overal bed mobility: Modified Independent                  Transfers Overall transfer level: Needs assistance Equipment used: None Transfers: Sit to/from Stand Sit to Stand: Min guard           General transfer comment: minG for safety      Balance Overall balance assessment: Mild deficits observed, not formally tested                   Tandem Stance - Right Leg: 15 Tandem Stance - Left Leg: 10 [place] - Eyes Opened: 15 [place] - Eyes Closed: 15   High Level Balance Comments: increased instability with narrow BOS, no overt LOB           ADL either performed or assessed with clinical judgement   ADL Overall ADL's : Modified independent                                       General ADL Comments: No physical assist provided.     Vision Baseline Vision/History: 0 No visual deficits Ability to See in Adequate Light: 0 Adequate Patient Visual Report: No change from baseline Vision Assessment?: No apparent visual deficits Additional Comments: Looking at calendar and able to see clock     Perception     Praxis      Pertinent Vitals/Pain Pain Assessment Pain Assessment: No/denies pain Pain Intervention(s): Monitored during session     Hand Dominance Right   Extremity/Trunk Assessment Upper Extremity Assessment Upper Extremity Assessment: Overall WFL for tasks assessed   Lower  Extremity Assessment Lower Extremity Assessment: Overall WFL for tasks assessed   Cervical / Trunk Assessment Cervical / Trunk Assessment: Normal   Communication Communication Communication: No difficulties   Cognition Arousal/Alertness: Awake/alert Behavior During Therapy: WFL for tasks assessed/performed Overall Cognitive Status: No family/caregiver present to determine baseline cognitive functioning Area of Impairment: Problem solving                             Problem Solving: Slow processing, Difficulty sequencing,  Requires verbal cues General Comments: Pt A/O x4 with context cues.  Pt slightlly tangential at times, but easily redirected and agreeable to therapy.Pt asked to find tooth brush and tooth paste- pt reading each label until finding the correct "shape" of toothpaste. Pt passing Short Blessed Test with no corrections. No family present to determine if baseline.     General Comments  HR 110-124 BPM with exertion    Exercises     Shoulder Instructions      Home Living Family/patient expects to be discharged to:: Private residence Living Arrangements: Alone Available Help at Discharge: Family;Available PRN/intermittently Type of Home: House Home Access: Stairs to enter [employer] of Steps: 4-5 (front)   Home Layout: One level     Bathroom Shower/Tub: Tub/shower unit;Walk-in shower   Bathroom Toilet: Standard     Home Equipment: Other (comment)   Additional Comments: uses walking stick every once in a while      Prior Functioning/Environment Prior Level of Function : Independent/Modified Independent             Mobility Comments: no AD\ ADLs Comments: independent ADL/iADL        OT Problem List: Decreased cognition;Decreased safety awareness      OT Treatment/Interventions: Self-care/ADL training;Therapeutic exercise;Energy conservation;DME and/or AE instruction;Therapeutic activities;Cognitive remediation/compensation;Patient/family education;Balance training    OT Goals(Current goals can be found in the care plan section) Acute Rehab OT Goals Patient Stated Goal: to go home OT Goal Formulation: With patient Time For Goal Achievement: 09/22/21 Potential to Achieve Goals: Good ADL Goals Additional ADL Goal #1: Pt will problem solve through higher level cognitive tasks with <2 verbal cues to attend to task. Additional ADL Goal #2: Pt will perform OOB ADL tasks with modified independence and no assist required for finding materials.  OT Frequency: Min  2X/week    Co-evaluation              AM-PAC OT "6 Clicks" Daily Activity     Outcome Measure Help from another person eating meals?: None Help from another person taking care of personal grooming?: A Little Help from another person toileting, which includes using toliet, bedpan, or urinal?: A Little Help from another person bathing (including washing, rinsing, drying)?: A Little Help from another person to put on and taking off regular upper body clothing?: None Help from another person to put on and taking off regular lower body clothing?: A Little 6 Click Score: 20   End of Session Nurse Communication: Mobility status  Activity Tolerance: Patient tolerated treatment well Patient left: in chair;with call bell/phone within reach;with chair alarm set  OT Visit Diagnosis: Unsteadiness on feet (R26.81);Other symptoms and signs involving cognitive function                Time: 3235-5732 OT Time Calculation (min): 13 min Charges:  OT General Charges $OT Visit: 1 Visit OT Evaluation $OT Eval Moderate Complexity: 1 Mod  Jefferey Pica, OTR/L Acute Rehabilitation Services Office:  [place] 09/08/2021, 9:49 AM

## 2021-09-08 NOTE — Procedures (Signed)
Patient Name: Antonio Scott  MRN: [id number]  Epilepsy Attending: Lora Havens  Referring Physician/Provider: Donnetta Simpers, MD  Date: 09/08/2021 Duration: 22.42 mins  Patient history: 66 y.o. 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 He presents today with concern for psychosis. EEG to evaluate for seizure.  Level of alertness: Awake, asleep  AEDs during EEG study: None  Technical aspects: This EEG study was done with scalp electrodes positioned according to the 10-20 International system of electrode placement. Electrical activity was reviewed with band pass filter of 1-'70Hz'$ , sensitivity of 7 uV/mm, display speed of 7m/sec with a '60Hz'$  notched filter applied as appropriate. EEG data were recorded continuously and digitally stored.  Video monitoring was available and reviewed as appropriate.  Description: The posterior dominant rhythm consists of 8 Hz activity of moderate voltage (25-35 uV) seen predominantly in posterior head regions, symmetric and reactive to eye opening and eye closing. Sleep was characterized by vertex waves, sleep spindles (12 to 14 Hz), maximal frontocentral region.  Hyperventilation and photic stimulation were not performed.     IMPRESSION: This study is within normal limits. No seizures or epileptiform discharges were seen throughout the recording.  A normal interictal EEG does not exclude nor support the diagnosis of epilepsy.   Carlotta Telfair OBarbra Sarks

## 2021-09-09 ENCOUNTER — Other Ambulatory Visit: Payer: Self-pay

## 2021-09-09 ENCOUNTER — Ambulatory Visit
Admission: RE | Admit: 2021-09-09 | Discharge: 2021-09-09 | Disposition: A | Discharge status: Home / Self Care | Payer: Medicare HMO | Source: Ambulatory Visit | Attending: Radiation Oncology | Admitting: Radiation Oncology

## 2021-09-09 ENCOUNTER — Ambulatory Visit: Payer: Medicare HMO

## 2021-09-09 DIAGNOSIS — G9341 Metabolic encephalopathy: Secondary | ICD-10-CM | POA: Diagnosis not present

## 2021-09-09 DIAGNOSIS — C37 Malignant neoplasm of thymus: Secondary | ICD-10-CM | POA: Diagnosis not present

## 2021-09-09 DIAGNOSIS — Z510 Encounter for antineoplastic radiation therapy: Secondary | ICD-10-CM | POA: Diagnosis not present

## 2021-09-09 LAB — COMPREHENSIVE METABOLIC PANEL
ALT: 16 U/L (ref 0–44)
AST: 17 U/L (ref 15–41)
Albumin: 3.3 g/dL — ABNORMAL LOW (ref 3.5–5.0)
Alkaline Phosphatase: 53 U/L (ref 38–126)
Anion gap: 3 — ABNORMAL LOW (ref 5–15)
BUN: 15 mg/dL (ref 8–23)
CO2: 26 mmol/L (ref 22–32)
Calcium: 8.9 mg/dL (ref 8.9–10.3)
Chloride: 110 mmol/L (ref 98–111)
Creatinine, Ser: 1.85 mg/dL — ABNORMAL HIGH (ref 0.61–1.24)
GFR, Estimated: 39 mL/min — ABNORMAL LOW (ref >60)
Glucose, Bld: 95 mg/dL (ref 70–99)
Potassium: 4.1 mmol/L (ref 3.5–5.1)
Sodium: 139 mmol/L (ref 135–145)
Total Bilirubin: 0.4 mg/dL (ref 0.3–1.2)
Total Protein: 5.8 g/dL — ABNORMAL LOW (ref 6.5–8.1)

## 2021-09-09 LAB — CBC WITH DIFFERENTIAL/PLATELET
Abs Immature Granulocytes: 0.01 10*3/uL (ref 0.00–0.07)
Basophils Absolute: 0.1 10*3/uL (ref 0.0–0.1)
Basophils Relative: 1 %
Eosinophils Absolute: 0.3 10*3/uL (ref 0.0–0.5)
Eosinophils Relative: 6 %
HCT: 39.5 % (ref 39.0–52.0)
Hemoglobin: 13.1 g/dL (ref 13.0–17.0)
Immature Granulocytes: 0 %
Lymphocytes Relative: 10 %
Lymphs Abs: 0.5 10*3/uL — ABNORMAL LOW (ref 0.7–4.0)
MCH: 30 pg (ref 26.0–34.0)
MCHC: 33.2 g/dL (ref 30.0–36.0)
MCV: 90.6 fL (ref 80.0–100.0)
Monocytes Absolute: 0.5 10*3/uL (ref 0.1–1.0)
Monocytes Relative: 11 %
Neutro Abs: 3.3 10*3/uL (ref 1.7–7.7)
Neutrophils Relative %: 72 %
Platelets: 122 10*3/uL — ABNORMAL LOW (ref 150–400)
RBC: 4.36 MIL/uL (ref 4.22–5.81)
RDW: 14.1 % (ref 11.5–15.5)
WBC: 4.6 10*3/uL (ref 4.0–10.5)
nRBC: 0 % (ref 0.0–0.2)

## 2021-09-09 LAB — RAD ONC ARIA SESSION SUMMARY
Course Elapsed Days: 39
Plan Fractions Treated to Date: 26
Plan Prescribed Dose Per Fraction: 1.8 Gy
Plan Total Fractions Prescribed: 28
Plan Total Prescribed Dose: 50.4 Gy
Reference Point Dosage Given to Date: 46.8 Gy
Reference Point Session Dosage Given: 1.8 Gy
Session Number: 26

## 2021-09-09 LAB — MAGNESIUM: Magnesium: 2 mg/dL (ref 1.7–2.4)

## 2021-09-09 LAB — LACTIC ACID, PLASMA: Lactic Acid, Venous: 0.8 mmol/L (ref 0.5–1.9)

## 2021-09-09 LAB — PHOSPHORUS: Phosphorus: 3.2 mg/dL (ref 2.5–4.6)

## 2021-09-09 MED ORDER — THIAMINE HCL 100 MG PO TABS: 100.0000 mg | ORAL_TABLET | Freq: Every day | ORAL | 2 refills | Status: AC

## 2021-09-09 MED ORDER — LEVOTHYROXINE SODIUM 75 MCG PO TABS: 75.0000 ug | ORAL_TABLET | Freq: Every day | ORAL | 0 refills | Status: DC

## 2021-09-09 MED ORDER — THIAMINE HCL 100 MG/ML IJ SOLN
100.0000 mg | Freq: Every day | INTRAMUSCULAR | Status: DC
Start: 2021-09-09 — End: 2021-09-09
  Administered 2021-09-09: 100 mg via INTRAVENOUS
  Filled 2021-09-09: qty 2

## 2021-09-09 MED ORDER — VITAMIN B-12 1000 MCG PO TABS: 1000.0000 ug | ORAL_TABLET | Freq: Every day | ORAL | 2 refills | Status: AC

## 2021-09-09 NOTE — Discharge Summary (Signed)
Physician Discharge Summary  Antonio Scott DJS:[id number] DOB: 09-May-1955 DOA: 09/07/2021  PCP: Baruch Gouty, FNP  Admit date: 09/07/2021 Discharge date: 09/09/2021  Admitted From: Home Disposition: Home  Recommendations for Outpatient Follow-up:  Follow up with PCP in 1-2 weeks Outpatient neurology follow-up  Home Health: N/A Equipment/Devices: N/A  Discharge Condition: Stable CODE STATUS: Full code Diet recommendation: Regular diet  Discharge summary: 66 year old with history of thymoma status postresection and currently on thyroid replacement, undergoing radiation therapy found at [facility] long hospital lobby confused.  Patient told the staff he was looking for [facility], could not find it so came to parked his car in the hospital parking lot because it is free and wanted to use the bathroom.  To the staff, he told them "wanted to see how hospital looks like on weekends".  Patient was difficult to reorient and distractible that prompted involuntary commitment for his safety, psychiatry and neurology evaluation.  Subsequently over time his mentation improved, he makes a joke of everything.  Detailed psych evaluation, without any delusions, no hallucinations, without any lethality.  Without any suicidal or homicidal ideation. Due to neurology evaluation, no neurodeficits.  Routine EEG normal.  CT head normal.  Electrolytes normal. -TSH more than 10, on thyroxine 25 mcg at home.  Will increase dose to 75 mcg daily.  Recheck in 1 month. -B12 was borderline low, replaced with injectable cyanocobalamin 1000 mcg intramuscular x 2, will discharge on oral replacement.  Please recheck in 1 month. -B1 levels are pending, will discharge with thiamine replacement.  Patient was monitored in the hospital for his safety.  He remains fairly stable.  He might have developed some cognitive decline and neurology recommended this to be followed up at outpatient neurology office.  Referral sent. Not  needing involuntary commitment.  Patient is stable for discharge.    Discharge Diagnoses:  Principal Problem:   Acute metabolic encephalopathy Active Problems:   Acquired hypothyroidism   Thymoma    Discharge Instructions  Discharge Instructions     Ambulatory referral to Neurology   Complete by: As directed    An appointment is requested in approximately: 4 weeks   Diet general   Complete by: As directed    Discharge instructions   Complete by: As directed    Schedule follow up with your primary care doctor and radiation doctor   Increase activity slowly   Complete by: As directed       Allergies as of 09/09/2021   No Known Allergies      Medication List     STOP taking these medications    nicotine polacrilex 2 MG lozenge Commonly known as: COMMIT       TAKE these medications    cyanocobalamin 1000 MCG tablet Commonly known as: VITAMIN B12 Take 1 tablet (1,000 mcg total) by mouth daily.   levothyroxine 75 MCG tablet Commonly known as: SYNTHROID Take 1 tablet (75 mcg total) by mouth daily before breakfast. Start taking on: September 10, 2021 What changed:  medication strength how much to take when to take this   thiamine 100 MG tablet Commonly known as: VITAMIN B1 Take 1 tablet (100 mg total) by mouth daily.        Follow-up Information     Baruch Gouty, FNP Follow up in 2 week(s).   Specialty: Family Medicine Contact information: [address] [place] [address] [phone]  No Known Allergies  Consultations: Neurology Psychiatry   Procedures/Studies: EEG adult  Result Date: September 29, 2021 Lora Havens, MD     29-Sep-2021  7:49 AM Patient Name: Antonio Scott MRN: [id number] Epilepsy Attending: Lora Havens Referring Physician/Provider: Donnetta Simpers, MD Date: 2021/09/29 Duration: 22.42 mins Patient history: 66 y.o. male with PMH significant for with PMH significant for anxiety, acquired  hypothyroidism, thymoma diagnosed in feb 2023 and s/p resection in may 2023 and undergoing radiation. He presents today with concern for psychosis. EEG to evaluate for seizure. Level of alertness: Awake, asleep AEDs during EEG study: None Technical aspects: This EEG study was done with scalp electrodes positioned according to the 10-20 International system of electrode placement. Electrical activity was reviewed with band pass filter of 1-'70Hz'$ , sensitivity of 7 uV/mm, display speed of 48m/sec with a '60Hz'$  notched filter applied as appropriate. EEG data were recorded continuously and digitally stored.  Video monitoring was available and reviewed as appropriate. Description: The posterior dominant rhythm consists of 8 Hz activity of moderate voltage (25-35 uV) seen predominantly in posterior head regions, symmetric and reactive to eye opening and eye closing. Sleep was characterized by vertex waves, sleep spindles (12 to 14 Hz), maximal frontocentral region.  Hyperventilation and photic stimulation were not performed.   IMPRESSION: This study is within normal limits. No seizures or epileptiform discharges were seen throughout the recording. A normal interictal EEG does not exclude nor support the diagnosis of epilepsy. PLora Havens  MR BRAIN W WO CONTRAST  Result Date: 09/07/2021 CLINICAL DATA:  Altered mental status EXAM: MRI HEAD WITHOUT AND WITH CONTRAST TECHNIQUE: Multiplanar, multiecho pulse sequences of the brain and surrounding structures were obtained without and with intravenous contrast. CONTRAST:  740mGADAVIST GADOBUTROL 1 MMOL/ML IV SOLN COMPARISON:  None Available. FINDINGS: Brain: No acute infarct, mass effect or extra-axial collection. No acute or chronic hemorrhage. There is multifocal hyperintense T2-weighted signal within the white matter. There is advanced atrophy. Old left PCA territory infarct. The midline structures are normal. There is no abnormal contrast enhancement. Vascular: Major  flow voids are preserved. Skull and upper cervical spine: Normal calvarium and skull base. Visualized upper cervical spine and soft tissues are normal. Sinuses/Orbits:No paranasal sinus fluid levels or advanced mucosal thickening. No mastoid or middle ear effusion. Normal orbits. IMPRESSION: 1. No acute intracranial abnormality. 2. Advanced atrophy and chronic microvascular ischemia. 3. Old left PCA territory infarct. Electronically Signed   By: KeUlyses Jarred.D.   On: 09/07/2021 19:30   DG Abdomen 1 View  Result Date: 09/07/2021 CLINICAL DATA:  Screening for MR EXAM: ABDOMEN - 1 VIEW COMPARISON:  None Available. FINDINGS: The bowel gas pattern is normal. No radio-opaque calculi or other significant radiographic abnormality are seen. Incomplete assessment of the lateral soft tissues of the proximal LEFT thigh. No unexpected radiopaque foreign body of the visualized abdomen and pelvis. IMPRESSION: No unexpected radiopaque foreign body of the visualized abdomen and pelvis. Electronically Signed   By: StValentino Saxon.D.   On: 09/07/2021 16:57   DG Chest Portable 1 View  Result Date: 09/07/2021 CLINICAL DATA:  MR screening EXAM: PORTABLE CHEST 1 VIEW COMPARISON:  July 26, 2021 FINDINGS: The cardiomediastinal silhouette is unchanged in contour.Biapical pleuroparenchymal thickening, similar comparison to prior. No pleural effusion. No pneumothorax. No acute pleuroparenchymal abnormality. Visualized abdomen is unremarkable. Known anterior mediastinal mass is better assessed on prior cross-sectional imaging. No unexpected radiopaque foreign body in the visualized thorax. IMPRESSION: No unexpected radiopaque foreign body  in the visualized thorax. Electronically Signed   By: Valentino Saxon M.D.   On: 09/07/2021 16:56   CT HEAD WO CONTRAST  Result Date: 09/07/2021 CLINICAL DATA:  Mental status change. EXAM: CT HEAD WITHOUT CONTRAST TECHNIQUE: Contiguous axial images were obtained from the base of the skull  through the vertex without intravenous contrast. RADIATION DOSE REDUCTION: This exam was performed according to the departmental dose-optimization program which includes automated exposure control, adjustment of the mA and/or kV according to patient size and/or use of iterative reconstruction technique. COMPARISON:  None Available. FINDINGS: Brain: Small area of hypoattenuation in the right temporal lobe. Moderate area of encephalomalacia in the left occipital lobe. Patchy hypoattenuation of the deep white matter bilaterally. Symmetrically prominent ventricles. No evidence of intracranial hemorrhage or mass effect. Vascular: No hyperdense vessel or unexpected calcification. Skull: Normal. Negative for fracture or focal lesion. Sinuses/Orbits: No acute finding. Other: None. IMPRESSION: 1. Small area of hypoattenuation in the right temporal lobe, which may represent an age-indeterminate infarct. 2. Moderate area of encephalomalacia in the left occipital lobe may be due to prior infarction. 3. Patchy hypoattenuation of the deep white matter bilaterally, likely due to small vessel disease. 4. Symmetrically prominent ventricles, likely due to brain parenchymal volume loss. 5. Recommend comparison to prior studies. If no prior studies are available, then further evaluation with MRI of the brain may be considered. Electronically Signed   By: Fidela Salisbury M.D.   On: 09/07/2021 15:07   (Echo, Carotid, EGD, Colonoscopy, ERCP)    Subjective: Patient seen and examined.  Full of sense of humor.  "If I say more jokes, you may think I am psychotic and you may keep me here so I will keep my jokes to myself".  He knows his car [facility] at [facility].  He is alert oriented x 4.  He is comfortable going home.  He is agreeable to keep up with appointments.   Discharge Exam: Vitals:   09/09/21 0722 09/09/21 1108  BP: 111/79 114/72  Pulse: 91   Resp: 19 17  Temp: 97.7 F (36.5 C) 98.3 F (36.8 C)  SpO2: 98% 98%    Vitals:   09/09/21 0213 09/09/21 0651 09/09/21 0722 09/09/21 1108  BP: 114/81 113/84 111/79 114/72  Pulse:   91   Resp: '16 16 19 17  '$ Temp: 98 F (36.7 C) 98.3 F (36.8 C) 97.7 F (36.5 C) 98.3 F (36.8 C)  TempSrc: Oral Oral Oral   SpO2: 98% 99% 98% 98%  Weight:      Height:        General: Pt is alert, awake, not in acute distress Oriented x 4.  Carries on conversation and it is difficult to bring him back to real-time conversation.   Cardiovascular: RRR, S1/S2 +, no rubs, no gallops Respiratory: CTA bilaterally, no wheezing, no rhonchi Abdominal: Soft, NT, ND, bowel sounds + Extremities: no edema, no cyanosis    The results of significant diagnostics from this hospitalization (including imaging, microbiology, ancillary and laboratory) are listed below for reference.     Microbiology: Recent Results (from the past 240 hour(s))  Resp Panel by RT-PCR (Flu A&B, Covid) Anterior Nasal Swab     Status: None   Collection Time: 09/07/21  9:38 PM   Specimen: Anterior Nasal Swab  Result Value Ref Range Status   SARS Coronavirus 2 by RT PCR NEGATIVE NEGATIVE Final    Comment: (NOTE) SARS-CoV-2 target nucleic acids are NOT DETECTED.  The SARS-CoV-2 RNA is  generally detectable in upper respiratory specimens during the acute phase of infection. The lowest concentration of SARS-CoV-2 viral copies this assay can detect is 138 copies/mL. A negative result does not preclude SARS-Cov-2 infection and should not be used as the sole basis for treatment or other patient management decisions. A negative result may occur with  improper specimen collection/handling, submission of specimen other than nasopharyngeal swab, presence of viral mutation(s) within the areas targeted by this assay, and inadequate number of viral copies(<138 copies/mL). A negative result must be combined with clinical observations, patient history, and epidemiological information. The expected result is  Negative.  Fact Sheet for Patients:  EntrepreneurPulse.com.au  Fact Sheet for Healthcare Providers:  IncredibleEmployment.be  This test is no t yet approved or cleared by the [place] FDA and  has been authorized for detection and/or diagnosis of SARS-CoV-2 by FDA under an Emergency Use Authorization (EUA). This EUA will remain  in effect (meaning this test can be used) for the duration of the COVID-19 declaration under Section 564(b)(1) of the Act, 21 U.S.C.section 360bbb-3(b)(1), unless the authorization is terminated  or revoked sooner.       Influenza A by PCR NEGATIVE NEGATIVE Final   Influenza B by PCR NEGATIVE NEGATIVE Final    Comment: (NOTE) The Xpert Xpress SARS-CoV-2/FLU/RSV plus assay is intended as an aid in the diagnosis of influenza from Nasopharyngeal swab specimens and should not be used as a sole basis for treatment. Nasal washings and aspirates are unacceptable for Xpert Xpress SARS-CoV-2/FLU/RSV testing.  Fact Sheet for Patients: EntrepreneurPulse.com.au  Fact Sheet for Healthcare Providers: IncredibleEmployment.be  This test is not yet approved or cleared by the [place] FDA and has been authorized for detection and/or diagnosis of SARS-CoV-2 by FDA under an Emergency Use Authorization (EUA). This EUA will remain in effect (meaning this test can be used) for the duration of the COVID-19 declaration under Section 564(b)(1) of the Act, 21 U.S.C. section 360bbb-3(b)(1), unless the authorization is terminated or revoked.  Performed at [facility], [place] [address] [place], [place] [address]      Labs: BNP (last 3 results) No results for input(s): "BNP" in the last 8760 hours. Basic Metabolic Panel: Recent Labs  Lab 09/07/21 1429 09/08/21 0445 09/09/21 0616  NA 140 138 139  K 3.9 3.6 4.1  CL 109 106 110  CO2 '22 22 26  '$ GLUCOSE 108* 86 95  BUN '15  9 15  '$ CREATININE 1.52* 1.34* 1.85*  CALCIUM 9.4 9.5 8.9  MG 2.1  --  2.0  PHOS  --  3.5 3.2   Liver Function Tests: Recent Labs  Lab 09/07/21 1429 09/08/21 0445 09/09/21 0616  AST '18 17 17  '$ ALT '18 14 16  '$ ALKPHOS 70 62 53  BILITOT 0.6 0.8 0.4  PROT 7.9 6.7 5.8*  ALBUMIN 4.4 3.7 3.3*   No results for input(s): "LIPASE", "AMYLASE" in the last 168 hours. Recent Labs  Lab 09/07/21 1430  AMMONIA 17   CBC: Recent Labs  Lab 09/07/21 1429 09/08/21 0445 09/09/21 0616  WBC 6.9 6.1 4.6  NEUTROABS 5.8  --  3.3  HGB 14.9 14.8 13.1  HCT 44.8 44.1 39.5  MCV 89.1 88.0 90.6  PLT 160 135* 122*   Cardiac Enzymes: Recent Labs  Lab 09/08/21 0445  CKTOTAL 187   BNP: Invalid input(s): "POCBNP" CBG: Recent Labs  Lab 09/07/21 1441  GLUCAP 88   D-Dimer No results for input(s): "DDIMER" in the last 72 hours. Hgb A1c  No results for input(s): "HGBA1C" in the last 72 hours. Lipid Profile No results for input(s): "CHOL", "HDL", "LDLCALC", "TRIG", "CHOLHDL", "LDLDIRECT" in the last 72 hours. Thyroid function studies Recent Labs    09/07/21 2100  TSH 12.977*   Anemia work up Recent Labs    09/07/21 2100 09/08/21 0445  VITAMINB12  --  156*  FOLATE  --  7.7  FERRITIN  --  22*  TIBC  --  357  IRON  --  117  RETICCTPCT 0.8  --    Urinalysis    Component Value Date/Time   COLORURINE STRAW (A) 09/07/2021 2145   APPEARANCEUR CLEAR 09/07/2021 2145   APPEARANCEUR Clear 11/15/2020 0944   LABSPEC 1.006 09/07/2021 2145   PHURINE 6.0 09/07/2021 2145   GLUCOSEU NEGATIVE 09/07/2021 2145   HGBUR NEGATIVE 09/07/2021 2145   BILIRUBINUR NEGATIVE 09/07/2021 2145   BILIRUBINUR Negative 11/15/2020 0944   KETONESUR NEGATIVE 09/07/2021 2145   PROTEINUR NEGATIVE 09/07/2021 2145   NITRITE NEGATIVE 09/07/2021 2145   LEUKOCYTESUR NEGATIVE 09/07/2021 2145   Sepsis Labs Recent Labs  Lab 09/07/21 1429 09/08/21 0445 09/09/21 0616  WBC 6.9 6.1 4.6   Microbiology Recent Results  (from the past 240 hour(s))  Resp Panel by RT-PCR (Flu A&B, Covid) Anterior Nasal Swab     Status: None   Collection Time: 09/07/21  9:38 PM   Specimen: Anterior Nasal Swab  Result Value Ref Range Status   SARS Coronavirus 2 by RT PCR NEGATIVE NEGATIVE Final    Comment: (NOTE) SARS-CoV-2 target nucleic acids are NOT DETECTED.  The SARS-CoV-2 RNA is generally detectable in upper respiratory specimens during the acute phase of infection. The lowest concentration of SARS-CoV-2 viral copies this assay can detect is 138 copies/mL. A negative result does not preclude SARS-Cov-2 infection and should not be used as the sole basis for treatment or other patient management decisions. A negative result may occur with  improper specimen collection/handling, submission of specimen other than nasopharyngeal swab, presence of viral mutation(s) within the areas targeted by this assay, and inadequate number of viral copies(<138 copies/mL). A negative result must be combined with clinical observations, patient history, and epidemiological information. The expected result is Negative.  Fact Sheet for Patients:  EntrepreneurPulse.com.au  Fact Sheet for Healthcare Providers:  IncredibleEmployment.be  This test is no t yet approved or cleared by the [place] FDA and  has been authorized for detection and/or diagnosis of SARS-CoV-2 by FDA under an Emergency Use Authorization (EUA). This EUA will remain  in effect (meaning this test can be used) for the duration of the COVID-19 declaration under Section 564(b)(1) of the Act, 21 U.S.C.section 360bbb-3(b)(1), unless the authorization is terminated  or revoked sooner.       Influenza A by PCR NEGATIVE NEGATIVE Final   Influenza B by PCR NEGATIVE NEGATIVE Final    Comment: (NOTE) The Xpert Xpress SARS-CoV-2/FLU/RSV plus assay is intended as an aid in the diagnosis of influenza from Nasopharyngeal swab specimens  and should not be used as a sole basis for treatment. Nasal washings and aspirates are unacceptable for Xpert Xpress SARS-CoV-2/FLU/RSV testing.  Fact Sheet for Patients: EntrepreneurPulse.com.au  Fact Sheet for Healthcare Providers: IncredibleEmployment.be  This test is not yet approved or cleared by the [place] FDA and has been authorized for detection and/or diagnosis of SARS-CoV-2 by FDA under an Emergency Use Authorization (EUA). This EUA will remain in effect (meaning this test can be used) for the duration of the COVID-19 declaration under Section  564(b)(1) of the Act, 21 U.S.C. section 360bbb-3(b)(1), unless the authorization is terminated or revoked.  Performed at [facility], [place] [address], [place], [place] [address]      Time coordinating discharge: 35 minutes  SIGNED:   Barb Merino, MD  [facility] 09/09/2021, 11:48 AM

## 2021-09-09 NOTE — Progress Notes (Signed)
CSW completed rescind IVC for patient to discharge now that he has been cleared. CSW notified RN.  Laveda Abbe, [place] Clinical Social Worker [phone]

## 2021-09-09 NOTE — TOC Transition Note (Signed)
Transition of Care [facility]) - CM/SW Discharge Note   Patient Details  Name: Antonio Scott MRN: [id number] Date of Birth: 1955/05/03  Transition of Care [facility]) CM/SW Contact:  Pollie Friar, RN Phone Number: 09/09/2021, 1:36 PM   Clinical Narrative:    Pt discharging home with self care. CM spoke to pt's brother and he states pt is at his baseline.  CM provided the bedside RN a cab voucher to get his to his truck at [employer].    Final next level of care: Home/Self Care Barriers to Discharge: No Barriers Identified   Patient Goals and CMS Choice        Discharge Placement                       Discharge Plan and Services                                     Social Determinants of Health (SDOH) Interventions     Readmission Risk Interventions     No data to display

## 2021-09-09 NOTE — Progress Notes (Signed)
Initial Nutrition Assessment  DOCUMENTATION CODES:   Severe malnutrition in context of acute illness/injury  INTERVENTION:  Continue current diet Provided with menu and educated on ordering process Encourage PO intake If pt remains at the hospital, add Ensure Enlive po BID, each supplement provides 350 kcal and 20 grams of protein and MVI with minerals daily  NUTRITION DIAGNOSIS:   Severe Malnutrition (in the context of acute illness) related to  (inadequate energy intake) as evidenced by moderate muscle depletion, moderate fat depletion, percent weight loss (8.1% x3 months).  GOAL:   Patient will meet greater than or equal to 90% of their needs  MONITOR:   PO intake, Supplement acceptance, I & O's, Weight trends  REASON FOR ASSESSMENT:   Consult Assessment of nutrition requirement/status  ASSESSMENT:  Pt with hx of HLD, hypothyroidism, thymoma (dx 03/2021, s/p resection in may 2023 and undergoing radiation) brought to [facility] after he was found wandering around [place] confused and rambling. Concern for psychosis.   Met with pt in room. Talking out loud to himself upon entry. Pt reports a good appetite and that he is hungry for lunch. Educated on ordering process and provided with a menu.   Pt endorses weight loss ever since being on radiation. 8.1% weight loss x 3 months which is severe. Denies changes in appetite or energy on days he receives radiation.  Pt likely to be discharged this afternoon.  Average Meal Intake: 8/6: 50% intake x 1 recorded meals  Nutritionally Relevant Medications: Scheduled Meds:  cyanocobalamin  1,000 mcg Intramuscular Q0600   thiamine injection  100 mg Intravenous Daily   Labs Reviewed: Creatinine 1.85  Micronutrient Labs: Vitamin B12 156 (low) Folate 7.7 Vitamin B6 pending  NUTRITION - FOCUSED PHYSICAL EXAM: Flowsheet Row Most Recent Value  Orbital Region Mild depletion  Upper Arm Region Severe depletion  Thoracic and  Lumbar Region Moderate depletion  Buccal Region Mild depletion  Temple Region Mild depletion  Clavicle Bone Region Mild depletion  Clavicle and Acromion Bone Region Moderate depletion  Scapular Bone Region Mild depletion  Dorsal Hand Mild depletion  Patellar Region Moderate depletion  Anterior Thigh Region Moderate depletion  Posterior Calf Region Moderate depletion  Edema (RD Assessment) None  Hair Reviewed  Eyes Reviewed  Mouth Reviewed  Skin Reviewed  Nails Reviewed   Diet Order:   Diet Order             Diet general           Diet regular Room service appropriate? Yes; Fluid consistency: Thin  Diet effective now                   EDUCATION NEEDS:  Education needs have been addressed  Skin:  Skin Assessment: Reviewed RN Assessment  Last BM:  8/5  Height:   Ht Readings from Last 1 Encounters:  09/07/21 '5\' 11"'  (1.803 m)    Weight:   Wt Readings from Last 1 Encounters:  09/07/21 72.8 kg    Ideal Body Weight:  78.2 kg  BMI:  Body mass index is 22.37 kg/m.  Estimated Nutritional Needs:   Kcal:  2000-2200 kcal/d  Protein:  100-110g/d  Fluid:  >/=2L/d    Ranell Patrick, RD, LDN Clinical Dietitian RD pager # available in [place]  After hours/weekend pager # available in [facility]
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
## 2021-09-10 ENCOUNTER — Other Ambulatory Visit: Payer: Self-pay

## 2021-09-10 ENCOUNTER — Ambulatory Visit: Payer: Medicare HMO

## 2021-09-10 ENCOUNTER — Ambulatory Visit
Admission: RE | Admit: 2021-09-10 | Discharge: 2021-09-10 | Disposition: A | Discharge status: Home / Self Care | Payer: Medicare HMO | Source: Ambulatory Visit | Attending: Radiation Oncology | Admitting: Radiation Oncology

## 2021-09-10 ENCOUNTER — Telehealth: Payer: Self-pay

## 2021-09-10 DIAGNOSIS — C37 Malignant neoplasm of thymus: Secondary | ICD-10-CM | POA: Diagnosis not present

## 2021-09-10 DIAGNOSIS — D4989 Neoplasm of unspecified behavior of other specified sites: Secondary | ICD-10-CM | POA: Diagnosis not present

## 2021-09-10 DIAGNOSIS — Z510 Encounter for antineoplastic radiation therapy: Secondary | ICD-10-CM | POA: Diagnosis not present

## 2021-09-10 LAB — RAD ONC ARIA SESSION SUMMARY
Course Elapsed Days: 40
Plan Fractions Treated to Date: 27
Plan Prescribed Dose Per Fraction: 1.8 Gy
Plan Total Fractions Prescribed: 28
Plan Total Prescribed Dose: 50.4 Gy
Reference Point Dosage Given to Date: 48.6 Gy
Reference Point Session Dosage Given: 1.8 Gy
Session Number: 27

## 2021-09-10 LAB — VITAMIN B1: Vitamin B1 (Thiamine): 122.3 nmol/L (ref 66.5–200.0)

## 2021-09-10 NOTE — Telephone Encounter (Signed)
Transition Care Management Unsuccessful Follow-up Telephone Call  Date of discharge and from where: Antonio Scott 09-09-21 Dx: acute metabolic encephalopathy   Attempts:  1st Attempt  Reason for unsuccessful TCM follow-up call:  Left voice message

## 2021-09-10 NOTE — Patient Outreach (Signed)
  Care Coordination [facility] Note Transition Care Management Unsuccessful Follow-up Telephone Call  Date of discharge and from where:  8//7/23 Antonio Scott  Attempts:  1st Attempt  Reason for unsuccessful TCM follow-up call:  Left voice message Peter Garter RN, [facility] [place], [place] Management [phone]

## 2021-09-11 ENCOUNTER — Ambulatory Visit: Payer: Medicare HMO

## 2021-09-11 ENCOUNTER — Ambulatory Visit
Admission: RE | Admit: 2021-09-11 | Discharge: 2021-09-11 | Disposition: A | Discharge status: Home / Self Care | Payer: Medicare HMO | Source: Ambulatory Visit | Attending: Radiation Oncology | Admitting: Radiation Oncology

## 2021-09-11 ENCOUNTER — Encounter: Payer: Self-pay | Admitting: Radiation Oncology

## 2021-09-11 ENCOUNTER — Ambulatory Visit: Payer: Self-pay

## 2021-09-11 ENCOUNTER — Other Ambulatory Visit: Payer: Self-pay

## 2021-09-11 DIAGNOSIS — Z510 Encounter for antineoplastic radiation therapy: Secondary | ICD-10-CM | POA: Diagnosis not present

## 2021-09-11 DIAGNOSIS — D4989 Neoplasm of unspecified behavior of other specified sites: Secondary | ICD-10-CM | POA: Diagnosis not present

## 2021-09-11 DIAGNOSIS — C37 Malignant neoplasm of thymus: Secondary | ICD-10-CM | POA: Diagnosis not present

## 2021-09-11 LAB — RAD ONC ARIA SESSION SUMMARY
Course Elapsed Days: 41
Plan Fractions Treated to Date: 28
Plan Prescribed Dose Per Fraction: 1.8 Gy
Plan Total Fractions Prescribed: 28
Plan Total Prescribed Dose: 50.4 Gy
Reference Point Dosage Given to Date: 50.4 Gy
Reference Point Session Dosage Given: 1.8 Gy
Session Number: 28

## 2021-09-11 NOTE — Patient Outreach (Signed)
  Care Coordination [facility] Note Transition Care Management Follow-up Telephone Call Date of discharge and from where: [facility] 09/09/21 How have you been since you were released from the hospital? "I am feeling better" Any questions or concerns? No  Items Reviewed: Did the pt receive and understand the discharge instructions provided? Yes  Medications obtained and verified? Yes  Other? No  Any new allergies since your discharge? No  Dietary orders reviewed? Yes Do you have support at home? Yes   Home Care and Equipment/Supplies: Were home health services ordered? no If so, what is the name of the agency? N/A  Has the agency set up a time to come to the patient's home? not applicable Were any new equipment or medical supplies ordered?  No What is the name of the medical supply agency? N/A Were you able to get the supplies/equipment? no Do you have any questions related to the use of the equipment or supplies? No  Functional Questionnaire: (I = Independent and D = Dependent) ADLs: I  Bathing/Dressing- I  Meal Prep- I  Eating- I  Maintaining continence- I  Transferring/Ambulation- I  Managing Meds- I  Follow up appointments reviewed:  PCP Hospital f/u appt confirmed? No   Specialist Hospital f/u appt confirmed? Yes  Scheduled to see Radiation oncology on 09/12/21 @ 3:00 and 10/14/21 at 3:30 Dr. Sondra Come. Are transportation arrangements needed? No  If their condition worsens, is the pt aware to call PCP or go to the Emergency Dept.? Yes Was the patient provided with contact information for the PCP's office or ED? Yes Was to pt encouraged to call back with questions or concerns? Yes  SDOH assessments and interventions completed:   No-Pt seemed somewhat confused on the phone.  Care Coordination Interventions Activated:  No   Care Coordination Interventions:   Oncology navigator managing at this time     Encounter Outcome:  Pt. Visit Completed

## 2021-09-11 NOTE — Telephone Encounter (Signed)
Transition Care Management Unsuccessful Follow-up Telephone Call  Date of discharge and from where:  [facility] 08/04/14 - acute metabolic encephalopathy  Attempts:  2nd Attempt  Reason for unsuccessful TCM follow-up call:  Left voice message

## 2021-09-12 ENCOUNTER — Ambulatory Visit: Payer: Medicare HMO

## 2021-09-12 NOTE — Telephone Encounter (Signed)
Transition Care Management Follow-up Telephone Call Date of discharge and from where: 09/09/21 - [facility] - acute metabolic encephalopathy How have you been since you were released from the hospital? He says he is okay - the patient is not making any sense, I am very concerned about him - he answered the phone and started talking not making any sense at all - it was very difficult to have a discussion - I tried to ensure he wrote down appt time, but I'm not sure he understood - I feel someone should check on him in person, but I was unable to reach his brother Any questions or concerns? Yes - he did not get B12 rx  Items Reviewed: Did the pt receive and understand the discharge instructions provided?  I could not verify that he understood Medications obtained and verified? No  Other? Yes  Any new allergies since your discharge? No  Dietary orders reviewed? Yes Do you have support at home? No   Home Care and Equipment/Supplies: Were home health services ordered? no  Were any new equipment or medical supplies ordered?  No  Functional Questionnaire: (I = Independent and D = Dependent) ADLs: I  Bathing/Dressing- I  Meal Prep- I  Eating- I  Maintaining continence- I  Transferring/Ambulation- I  Managing Meds- I  Follow up appointments reviewed:  PCP Hospital f/u appt confirmed? Yes  Scheduled to see Darla Lesches on 09/17/2021 @ 10:50. [facility] f/u appt confirmed? No   Are transportation arrangements needed? No  If their condition worsens, is the pt aware to call PCP or go to the Emergency Dept.? Yes Was the patient provided with contact information for the PCP's office or ED? Yes Was to pt encouraged to call back with questions or concerns? Yes

## 2021-09-17 ENCOUNTER — Ambulatory Visit: Payer: Medicare HMO | Admitting: Family Medicine

## 2021-09-19 ENCOUNTER — Telehealth: Payer: Self-pay | Admitting: Family Medicine

## 2021-09-19 NOTE — Telephone Encounter (Signed)
Patient wanted help with pill identification. I advised going to the pharmacy.  He said he would.

## 2021-10-09 ENCOUNTER — Encounter: Payer: Self-pay | Admitting: Radiation Oncology

## 2021-10-11 NOTE — Progress Notes (Incomplete)
  Radiation Oncology         [phone] ________________________________  Patient Name: Antonio Scott MRN: [id number] DOB: Oct 02, 1955 Referring Physician: Melodie Bouillon Date of Service: 09/11/2021 [facility], [place]                                                        End Of Treatment Note  Diagnoses: C37-Malignant neoplasm of thymus  Cancer Staging: The encounter diagnosis was Thymoma.   Type B1 thymoma - mediastinal mass  Intent: Curative  Radiation Treatment Dates: 08/01/2021 through 09/11/2021 Site Technique Total Dose (Gy) Dose per Fx (Gy) Completed Fx Beam Energies  Mediastinum: Chest 3D 50.4/50.4 1.8 28/28 6X, 10X, 15X   Narrative: The patient tolerated radiation therapy relatively well. During his final weekly treatment check on 09/10/21, the patient reported mild redness to his back. He denied any other symptoms. Physical exam performed on that same date revealed some erythema and hyperpigmentation changes to the upper back region. No skin breakdown was appreciated.   Plan: The patient will follow-up with radiation oncology in one month .  ________________________________________________ -----------------------------------  Blair Promise, PhD, MD  This document serves as a record of services personally performed by Gery Pray, MD. It was created on his behalf by Roney Mans, a trained medical scribe. The creation of this record is based on the scribe's personal observations and the provider's statements to them. This document has been checked and approved by the attending provider.

## 2021-10-14 ENCOUNTER — Ambulatory Visit
Admission: RE | Admit: 2021-10-14 | Discharge: 2021-10-14 | Disposition: A | Discharge status: Home / Self Care | Payer: Medicare HMO | Source: Ambulatory Visit | Attending: Radiation Oncology | Admitting: Radiation Oncology

## 2021-10-14 ENCOUNTER — Encounter: Payer: Self-pay | Admitting: *Deleted

## 2021-10-14 ENCOUNTER — Encounter: Payer: Self-pay | Admitting: Thoracic Surgery (Cardiothoracic Vascular Surgery)

## 2021-10-14 HISTORY — DX: Personal history of irradiation: Z92.3

## 2021-10-15 ENCOUNTER — Encounter: Payer: Self-pay | Admitting: Thoracic Surgery (Cardiothoracic Vascular Surgery)

## 2021-10-15 ENCOUNTER — Telehealth: Payer: Self-pay | Admitting: *Deleted

## 2021-10-15 NOTE — Telephone Encounter (Signed)
Called patient to ask about rescheduling missed fu appt. on 10-14-21, lvm for a return call

## 2021-10-22 LAB — MISC LABCORP TEST (SEND OUT): Labcorp test code: 9985

## 2021-11-06 ENCOUNTER — Encounter (HOSPITAL_COMMUNITY): Payer: Self-pay

## 2021-11-06 ENCOUNTER — Ambulatory Visit (HOSPITAL_COMMUNITY)
Admission: RE | Admit: 2021-11-06 | Discharge: 2021-11-06 | Disposition: A | Discharge status: Home / Self Care | Payer: Medicare HMO | Source: Ambulatory Visit | Attending: Hematology | Admitting: Hematology

## 2021-11-06 ENCOUNTER — Inpatient Hospital Stay: Payer: Medicare HMO | Attending: Hematology

## 2021-11-06 DIAGNOSIS — C37 Malignant neoplasm of thymus: Secondary | ICD-10-CM | POA: Insufficient documentation

## 2021-11-06 DIAGNOSIS — J9859 Other diseases of mediastinum, not elsewhere classified: Secondary | ICD-10-CM | POA: Insufficient documentation

## 2021-11-06 DIAGNOSIS — R911 Solitary pulmonary nodule: Secondary | ICD-10-CM | POA: Diagnosis not present

## 2021-11-06 DIAGNOSIS — D4989 Neoplasm of unspecified behavior of other specified sites: Secondary | ICD-10-CM | POA: Insufficient documentation

## 2021-11-06 DIAGNOSIS — R918 Other nonspecific abnormal finding of lung field: Secondary | ICD-10-CM | POA: Diagnosis not present

## 2021-11-06 DIAGNOSIS — F17210 Nicotine dependence, cigarettes, uncomplicated: Secondary | ICD-10-CM | POA: Insufficient documentation

## 2021-11-06 DIAGNOSIS — Z803 Family history of malignant neoplasm of breast: Secondary | ICD-10-CM | POA: Insufficient documentation

## 2021-11-06 DIAGNOSIS — J439 Emphysema, unspecified: Secondary | ICD-10-CM | POA: Diagnosis not present

## 2021-11-06 DIAGNOSIS — Z800 Family history of malignant neoplasm of digestive organs: Secondary | ICD-10-CM | POA: Insufficient documentation

## 2021-11-06 LAB — POCT I-STAT CREATININE: Creatinine, Ser: 1.8 mg/dL — ABNORMAL HIGH (ref 0.61–1.24)

## 2021-11-06 MED ORDER — IOHEXOL 300 MG/ML  SOLN
100.0000 mL | Freq: Once | INTRAMUSCULAR | Status: AC | PRN
  Administered 2021-11-06: 75 mL via INTRAVENOUS

## 2021-11-11 ENCOUNTER — Inpatient Hospital Stay: Payer: Medicare HMO

## 2021-11-11 DIAGNOSIS — C37 Malignant neoplasm of thymus: Secondary | ICD-10-CM | POA: Insufficient documentation

## 2021-11-11 DIAGNOSIS — D4989 Neoplasm of unspecified behavior of other specified sites: Secondary | ICD-10-CM

## 2021-11-11 DIAGNOSIS — Z803 Family history of malignant neoplasm of breast: Secondary | ICD-10-CM | POA: Diagnosis not present

## 2021-11-11 DIAGNOSIS — F17210 Nicotine dependence, cigarettes, uncomplicated: Secondary | ICD-10-CM | POA: Diagnosis not present

## 2021-11-11 DIAGNOSIS — Z800 Family history of malignant neoplasm of digestive organs: Secondary | ICD-10-CM | POA: Diagnosis not present

## 2021-11-11 DIAGNOSIS — R69 Illness, unspecified: Secondary | ICD-10-CM | POA: Diagnosis not present

## 2021-11-11 LAB — COMPREHENSIVE METABOLIC PANEL
ALT: 17 U/L (ref 0–44)
AST: 21 U/L (ref 15–41)
Albumin: 4.3 g/dL (ref 3.5–5.0)
Alkaline Phosphatase: 88 U/L (ref 38–126)
Anion gap: 8 (ref 5–15)
BUN: 9 mg/dL (ref 8–23)
CO2: 27 mmol/L (ref 22–32)
Calcium: 9.7 mg/dL (ref 8.9–10.3)
Chloride: 105 mmol/L (ref 98–111)
Creatinine, Ser: 1.56 mg/dL — ABNORMAL HIGH (ref 0.61–1.24)
GFR, Estimated: 48 mL/min — ABNORMAL LOW (ref >60)
Glucose, Bld: 130 mg/dL — ABNORMAL HIGH (ref 70–99)
Potassium: 4.4 mmol/L (ref 3.5–5.1)
Sodium: 140 mmol/L (ref 135–145)
Total Bilirubin: 0.9 mg/dL (ref 0.3–1.2)
Total Protein: 7.5 g/dL (ref 6.5–8.1)

## 2021-11-11 LAB — CBC WITH DIFFERENTIAL/PLATELET
Abs Immature Granulocytes: 0.03 10*3/uL (ref 0.00–0.07)
Basophils Absolute: 0.1 10*3/uL (ref 0.0–0.1)
Basophils Relative: 1 %
Eosinophils Absolute: 0.2 10*3/uL (ref 0.0–0.5)
Eosinophils Relative: 3 %
HCT: 46.3 % (ref 39.0–52.0)
Hemoglobin: 15.3 g/dL (ref 13.0–17.0)
Immature Granulocytes: 1 %
Lymphocytes Relative: 12 %
Lymphs Abs: 0.6 10*3/uL — ABNORMAL LOW (ref 0.7–4.0)
MCH: 30.5 pg (ref 26.0–34.0)
MCHC: 33 g/dL (ref 30.0–36.0)
MCV: 92.4 fL (ref 80.0–100.0)
Monocytes Absolute: 0.3 10*3/uL (ref 0.1–1.0)
Monocytes Relative: 6 %
Neutro Abs: 4.1 10*3/uL (ref 1.7–7.7)
Neutrophils Relative %: 77 %
Platelets: 209 10*3/uL (ref 150–400)
RBC: 5.01 MIL/uL (ref 4.22–5.81)
RDW: 14.8 % (ref 11.5–15.5)
WBC: 5.3 10*3/uL (ref 4.0–10.5)
nRBC: 0 % (ref 0.0–0.2)

## 2021-11-11 LAB — LACTATE DEHYDROGENASE: LDH: 180 U/L (ref 98–192)

## 2021-11-13 ENCOUNTER — Inpatient Hospital Stay: Payer: Medicare HMO | Admitting: Hematology

## 2021-11-19 ENCOUNTER — Ambulatory Visit: Payer: Medicare HMO | Admitting: Hematology

## 2021-11-19 NOTE — Progress Notes (Deleted)
[address], [place] [address]   CLINIC:  Medical Oncology/Hematology  PCP:  Baruch Gouty, [place] [place] / [place] [address] [phone]   REASON FOR VISIT:  Follow-up for thymoma  PRIOR THERAPY: Robotic assisted thoracoscopic resection of anterior mediastinal mass  NGS Results: not done  CURRENT THERAPY: PORT recommended.  BRIEF ONCOLOGIC HISTORY:  Oncology History   No history exists.    CANCER STAGING:  Cancer Staging  Thymoma Staging form: Thymus, AJCC 8th Edition - Clinical stage from 07/10/2021: Stage II (cT2, cN0, cM0) - Unsigned - Pathologic stage from 07/17/2021: Stage Unknown (pT2, pNX, cM0) - Signed by Gery Pray, MD on 07/17/2021   INTERVAL HISTORY:  Antonio Scott, a 66 y.o. male, returns for routine follow-up of his thymoma. Antonio Scott was last seen on 04/08/2021.   Today he reports feeling good. He reports left sided scapula pain.   REVIEW OF SYSTEMS:  Review of Systems  Constitutional:  Negative for appetite change and fatigue.  Gastrointestinal:  Positive for abdominal pain (R side).  Musculoskeletal:  Positive for back pain (L upper).  Skin:  Positive for itching.  Psychiatric/Behavioral:  Positive for sleep disturbance.   All other systems reviewed and are negative.   PAST MEDICAL/SURGICAL HISTORY:  Past Medical History:  Diagnosis Date   Anxiety    History of external beam radiation therapy    Mediastinum- 08/01/21-09/11/21-Dr. Gery Pray   Hypothyroidism    Past Surgical History:  Procedure Laterality Date   INTERCOSTAL NERVE BLOCK Right 06/17/2021   Procedure: INTERCOSTAL NERVE BLOCK;  Surgeon: Lajuana Matte, MD;  Location: [facility];  Service: Thoracic;  Laterality: Right;   SKIN LESION EXCISION      SOCIAL HISTORY:  Social History   Socioeconomic History   Marital status: Single    Spouse name: Not on file   Number of children: 0   Years of education: Not on file    Highest education level: Not on file  Occupational History   Occupation: retired  Tobacco Use   Smoking status: Every Day    Packs/day: 0.20    Years: 50.00    Total pack years: 10.00    Types: Cigarettes   Smokeless tobacco: Never   Tobacco comments:    Currently smoking 7-10 cig daily  Vaping Use   Vaping Use: Never used  Substance and Sexual Activity   Alcohol use: Not Currently   Drug use: Not Currently   Sexual activity: Not Currently  Other Topics Concern   Not on file  Social History Narrative   No children   Brother lives a mile away   Social Determinants of Health   Financial Resource Strain: Low Risk  (02/25/2021)   Overall Financial Resource Strain (CARDIA)    Difficulty of Paying Living Expenses: Not hard at all  Food Insecurity: No Food Insecurity (02/25/2021)   Hunger Vital Sign    Worried About Running Out of Food in the Last Year: Never true    Ran Out of Food in the Last Year: Never true  Transportation Needs: No Transportation Needs (02/25/2021)   PRAPARE - Hydrologist (Medical): No    Lack of Transportation (Non-Medical): No  Physical Activity: Sufficiently Active (02/25/2021)   Exercise Vital Sign    Days of Exercise per Week: 6 days    Minutes of Exercise per Session: 30 min  Stress: No Stress Concern Present (02/25/2021)  [place] Questionnaire    Feeling of Stress : Only a little  Social Connections: Socially Isolated (02/25/2021)   Social Connection and Isolation Panel [NHANES]    Frequency of Communication with Friends and Family: Twice a week    Frequency of Social Gatherings with Friends and Family: Once a week    Attends Religious Services: Never    Marine scientist or Organizations: No    Attends Archivist Meetings: Never    Marital Status: Never married  Intimate Partner Violence: Not At Risk (02/25/2021)   Humiliation, Afraid, Rape, and Kick  questionnaire    Fear of Current or Ex-Partner: No    Emotionally Abused: No    Physically Abused: No    Sexually Abused: No    FAMILY HISTORY:  Family History  Problem Relation Age of Onset   Stroke Mother    Diabetes Mother    Cancer Father    Cancer Brother    Liver cancer Maternal Aunt    Colon cancer Paternal Grandfather     CURRENT MEDICATIONS:  Current Outpatient Medications  Medication Sig Dispense Refill   cyanocobalamin (VITAMIN B12) 1000 MCG tablet Take 1 tablet (1,000 mcg total) by mouth daily. (Patient not taking: Reported on 09/12/2021) 30 tablet 2   levothyroxine (SYNTHROID) 75 MCG tablet Take 1 tablet (75 mcg total) by mouth daily before breakfast. 30 tablet 0   thiamine (VITAMIN B1) 100 MG tablet Take 1 tablet (100 mg total) by mouth daily. 30 tablet 2   No current facility-administered medications for this visit.    ALLERGIES:  No Known Allergies  PHYSICAL EXAM:  Performance status (ECOG): 1 - Symptomatic but completely ambulatory  There were no vitals filed for this visit.  Wt Readings from Last 3 Encounters:  09/07/21 160 lb 6.4 oz (72.8 kg)  08/20/21 160 lb (72.6 kg)  07/26/21 163 lb (73.9 kg)   Physical Exam Vitals reviewed.  Constitutional:      Appearance: Normal appearance.  Cardiovascular:     Rate and Rhythm: Normal rate and regular rhythm.     Pulses: Normal pulses.     Heart sounds: Normal heart sounds.  Pulmonary:     Effort: Pulmonary effort is normal.     Breath sounds: Normal breath sounds.  Neurological:     General: No focal deficit present.     Mental Status: He is alert and oriented to person, place, and time.  Psychiatric:        Mood and Affect: Mood normal.        Behavior: Behavior normal.      LABORATORY DATA:  I have reviewed the labs as listed.     Latest Ref Rng & Units 11/11/2021    3:35 PM 09/09/2021    6:16 AM 09/08/2021    4:45 AM  CBC  WBC 4.0 - 10.5 K/uL 5.3  4.6  6.1   Hemoglobin 13.0 - 17.0 g/dL 15.3   13.1  14.8   Hematocrit 39.0 - 52.0 % 46.3  39.5  44.1   Platelets 150 - 400 K/uL 209  122  135       Latest Ref Rng & Units 11/11/2021    3:35 PM 11/06/2021    1:13 PM 09/09/2021    6:16 AM  CMP  Glucose 70 - 99 mg/dL 130   95   BUN 8 - 23 mg/dL 9   15   Creatinine 0.61 - 1.24 mg/dL  1.56  1.80  1.85   Sodium 135 - 145 mmol/L 140   139   Potassium 3.5 - 5.1 mmol/L 4.4   4.1   Chloride 98 - 111 mmol/L 105   110   CO2 22 - 32 mmol/L 27   26   Calcium 8.9 - 10.3 mg/dL 9.7   8.9   Total Protein 6.5 - 8.1 g/dL 7.5   5.8   Total Bilirubin 0.3 - 1.2 mg/dL 0.9   0.4   Alkaline Phos 38 - 126 U/L 88   53   AST 15 - 41 U/L 21   17   ALT 0 - 44 U/L 17   16     DIAGNOSTIC IMAGING:  I have independently reviewed the scans and discussed with the patient. CT Chest W Contrast  Result Date: 11/07/2021 CLINICAL DATA:  Follow-up pulmonary nodules. History of thymoma resection on 06/17/2021. completed adjuvant radiation therapy 09/11/2021. * Tracking Code: BO * EXAM: CT CHEST WITH CONTRAST TECHNIQUE: Multidetector CT imaging of the chest was performed during intravenous contrast administration. RADIATION DOSE REDUCTION: This exam was performed according to the departmental dose-optimization program which includes automated exposure control, adjustment of the mA and/or kV according to patient size and/or use of iterative reconstruction technique. CONTRAST:  78m OMNIPAQUE IOHEXOL 300 MG/ML  SOLN COMPARISON:  02/11/2021 screening chest CT.  03/07/2021 PET-CT. FINDINGS: Significantly motion degraded scan, limiting evaluation. Cardiovascular: Normal heart size. No significant pericardial effusion/thickening. Left anterior descending coronary atherosclerosis. Atherosclerotic nonaneurysmal thoracic aorta. Normal caliber pulmonary arteries. No central pulmonary emboli. Mediastinum/Nodes: No significant thyroid nodules. Unremarkable esophagus. No pathologically enlarged axillary, mediastinal or hilar lymph nodes.  Interval resection of lobulated solid anterior mediastinal mass with no evidence of residual or recurrent anterior mediastinal mass. Lungs/Pleura: No pneumothorax. No pleural effusion. Mild paraseptal and centrilobular emphysema. No acute consolidative airspace disease or lung masses. A few scattered bilateral pulmonary nodules are all stable since 02/11/2021 chest CT, including 0.7 cm left perifissural nodule (series 4/image 55) and 0.3 cm anterior right upper lobe nodule (series 4/image 40). Minimally thickened curvilinear nodular opacity in the dependent medial basilar left lower lobe (series 4/image 113) is stable. No new significant pulmonary nodules. Upper abdomen: Chronic calcified right adrenal gland compatible with remote insult. Musculoskeletal: No aggressive appearing focal osseous lesions. Mild thoracic spondylosis. IMPRESSION: 1. Significantly motion degraded scan, limiting evaluation. 2. Interval resection of lobulated solid anterior mediastinal mass with no evidence of residual or recurrent anterior mediastinal mass. 3. No findings suspicious for metastatic disease in the chest. 4. Scattered small bilateral pulmonary nodules are all stable since 02/11/2021 chest CT, presumably benign. Suggest resuming screening chest CT program in 6-12 months. 5. One vessel coronary atherosclerosis. 6. Aortic Atherosclerosis (ICD10-I70.0) and Emphysema (ICD10-J43.9). Electronically Signed   By: JIlona SorrelM.D.   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  No enlarged lymph nodes. - MRI of the chest with and without contrast on 03/06/2021: Heterogeneous, lobulated solid mass in the anterior mediastinum with faint contrast-enhancement measuring 4.4 x 2.8 cm.  Small areas of cystic change internally.  No enlarged mediastinal, hilar or axillary lymph nodes. -  PET scan on 03/07/2021: Macrolobulated solid anterior mediastinal mass is hypermetabolic with SUV 9.4.  Thyroid is diffusely hypermetabolic without dominant mass with SUV 9.4.  Hypermetabolism anterior to  the larynx could be muscular or related to atypical location of the node in this region including 6 mm SUV 3.9. - He was evaluated by Dr. Kipp Brood.  Tumor markers show normal LDH and beta-hCG.  AFP was minimally high at 6.3 (less than 6.1). - No B symptoms. - Robotic assisted thoracoscopic resection of anterior mediastinal mass on 06/17/2021. - Pathology: 5.6 cm type B1 thymoma.  Margins negative for tumor.  Carcinoma appears to involve pericardium.    Social/family history: - He is a retired Engineer, structural. - Current active smoker, smoked 1 pack/day for 50 years, now smoking 2 cigarettes/day since last 1 month. - Brother died of thymic carcinoma.  Another brother had melanoma.  Maternal aunt had liver cancer.  Maternal grandmother had breast cancer.  Maternal grandfather had pancreatic cancer.  Paternal grandfather had colon cancer.   PLAN:  Stage II/ Masaoka stage III (T2Nx M0) thymoma: - I have reviewed pathology report with the patient in detail. - Given the high risk of local recurrence, I have strongly recommended postoperative radiation therapy (PORT).  Data regarding chemotherapy is limited in this setting. - I will schedule him for a CT of the chest in 4 months.  After that he will receive a CT scan every 6 months for 2 years, then annually for 5-10 years.  We have also discussed surveillance plan in detail.   Orders placed this encounter:  No orders of the defined types were placed in this encounter.    Derek Jack, MD [place] [phone]   I, Thana Ates, am acting as a scribe for Dr. Derek Jack.  I, Derek Jack MD, have reviewed the above documentation for accuracy and completeness, and I agree with the above.

## 2021-11-20 ENCOUNTER — Inpatient Hospital Stay (HOSPITAL_BASED_OUTPATIENT_CLINIC_OR_DEPARTMENT_OTHER): Payer: Medicare HMO | Admitting: Hematology

## 2021-11-20 VITALS — BP 118/76 | HR 86 | Temp 97.8°F | Resp 18 | Ht 71.0 in | Wt 153.1 lb

## 2021-11-20 DIAGNOSIS — R69 Illness, unspecified: Secondary | ICD-10-CM | POA: Diagnosis not present

## 2021-11-20 DIAGNOSIS — Z800 Family history of malignant neoplasm of digestive organs: Secondary | ICD-10-CM | POA: Diagnosis not present

## 2021-11-20 DIAGNOSIS — D4989 Neoplasm of unspecified behavior of other specified sites: Secondary | ICD-10-CM | POA: Diagnosis not present

## 2021-11-20 DIAGNOSIS — J9859 Other diseases of mediastinum, not elsewhere classified: Secondary | ICD-10-CM | POA: Diagnosis not present

## 2021-11-20 DIAGNOSIS — C37 Malignant neoplasm of thymus: Secondary | ICD-10-CM | POA: Diagnosis not present

## 2021-11-20 DIAGNOSIS — Z803 Family history of malignant neoplasm of breast: Secondary | ICD-10-CM | POA: Diagnosis not present

## 2021-11-20 NOTE — Progress Notes (Signed)
[place], [place] [address]   CLINIC:  Medical Oncology/Hematology  PCP:  Baruch Gouty, [place] / [place] [address] [phone]   REASON FOR VISIT:  Follow-up for thymoma  PRIOR THERAPY: Robotic assisted thoracoscopic resection of anterior mediastinal mass  NGS Results: not done  CURRENT THERAPY: PORT recommended.  BRIEF ONCOLOGIC HISTORY:  Oncology History   No history exists.    CANCER STAGING:  Cancer Staging  Thymoma Staging form: Thymus, AJCC 8th Edition - Clinical stage from 07/10/2021: Stage II (cT2, cN0, cM0) - Unsigned - Pathologic stage from 07/17/2021: Stage Unknown (pT2, pNX, cM0) - Signed by Gery Pray, MD on 07/17/2021   INTERVAL HISTORY:  [name], a 66 y.o. male, seen for follow-up of thymoma.  He has completed postoperative radiation therapy.  Denies any chest pains.  Denies any dizziness, shortness of breath on exertion.  REVIEW OF SYSTEMS:  Review of Systems  Psychiatric/Behavioral:  Negative for sleep disturbance.   All other systems reviewed and are negative.   PAST MEDICAL/SURGICAL HISTORY:  Past Medical History:  Diagnosis Date   Anxiety    History of external beam radiation therapy    Mediastinum- 08/01/21-09/11/21-Dr. Gery Pray   Hypothyroidism    Past Surgical History:  Procedure Laterality Date   INTERCOSTAL NERVE BLOCK Right 06/17/2021   Procedure: INTERCOSTAL NERVE BLOCK;  Surgeon: Lajuana Matte, MD;  Location: [facility];  Service: Thoracic;  Laterality: Right;   SKIN LESION EXCISION      SOCIAL HISTORY:  Social History   Socioeconomic History   Marital status: Single    Spouse name: Not on file   Number of children: 0   Years of education: Not on file   Highest education level: Not on file  Occupational History   Occupation: retired  Tobacco Use   Smoking status: Every Day    Packs/day: 0.20    Years: 50.00    Total pack years: 10.00    Types:  Cigarettes   Smokeless tobacco: Never   Tobacco comments:    Currently smoking 7-10 cig daily  Vaping Use   Vaping Use: Never used  Substance and Sexual Activity   Alcohol use: Not Currently   Drug use: Not Currently   Sexual activity: Not Currently  Other Topics Concern   Not on file  Social History Narrative   No children   Brother lives a mile away   Social Determinants of Health   Financial Resource Strain: Low Risk  (02/25/2021)   Overall Financial Resource Strain (CARDIA)    Difficulty of Paying Living Expenses: Not hard at all  Food Insecurity: No Food Insecurity (02/25/2021)   Hunger Vital Sign    Worried About Running Out of Food in the Last Year: Never true    Ran Out of Food in the Last Year: Never true  Transportation Needs: No Transportation Needs (02/25/2021)   PRAPARE - Hydrologist (Medical): No    Lack of Transportation (Non-Medical): No  Physical Activity: Sufficiently Active (02/25/2021)   Exercise Vital Sign    Days of Exercise per Week: 6 days    Minutes of Exercise per Session: 30 min  Stress: No Stress Concern Present (02/25/2021)   [place]    Feeling of Stress : Only a little  Social Connections: Socially Isolated (02/25/2021)   Social Connection and Isolation Panel [NHANES]  Frequency of Communication with Friends and Family: Twice a week    Frequency of Social Gatherings with Friends and Family: Once a week    Attends Religious Services: Never    Marine scientist or Organizations: No    Attends Archivist Meetings: Never    Marital Status: Never married  Intimate Partner Violence: Not At Risk (02/25/2021)   Humiliation, Afraid, Rape, and Kick questionnaire    Fear of Current or Ex-Partner: No    Emotionally Abused: No    Physically Abused: No    Sexually Abused: No    FAMILY HISTORY:  Family History  Problem Relation Age of Onset    Stroke Mother    Diabetes Mother    Cancer Father    Cancer Brother    Liver cancer Maternal Aunt    Colon cancer Paternal Grandfather     CURRENT MEDICATIONS:  Current Outpatient Medications  Medication Sig Dispense Refill   cyanocobalamin (VITAMIN B12) 1000 MCG tablet Take 1 tablet (1,000 mcg total) by mouth daily. 30 tablet 2   thiamine (VITAMIN B1) 100 MG tablet Take 1 tablet (100 mg total) by mouth daily. 30 tablet 2   levothyroxine (SYNTHROID) 75 MCG tablet Take 1 tablet (75 mcg total) by mouth daily before breakfast. 30 tablet 0   No current facility-administered medications for this visit.    ALLERGIES:  No Known Allergies  PHYSICAL EXAM:  Performance status (ECOG): 1 - Symptomatic but completely ambulatory  Vitals:   11/20/21 1537  BP: 118/76  Pulse: 86  Resp: 18  Temp: 97.8 F (36.6 C)  SpO2: 95%    Wt Readings from Last 3 Encounters:  11/20/21 153 lb 1.6 oz (69.4 kg)  09/07/21 160 lb 6.4 oz (72.8 kg)  08/20/21 160 lb (72.6 kg)   Physical Exam Vitals reviewed.  Constitutional:      Appearance: Normal appearance.  Cardiovascular:     Rate and Rhythm: Normal rate and regular rhythm.     Pulses: Normal pulses.     Heart sounds: Normal heart sounds.  Pulmonary:     Effort: Pulmonary effort is normal.     Breath sounds: Normal breath sounds.  Neurological:     General: No focal deficit present.     Mental Status: He is alert and oriented to person, place, and time.  Psychiatric:        Mood and Affect: Mood normal.        Behavior: Behavior normal.      LABORATORY DATA:  I have reviewed the labs as listed.     Latest Ref Rng & Units 11/11/2021    3:35 PM 09/09/2021    6:16 AM 09/08/2021    4:45 AM  CBC  WBC 4.0 - 10.5 K/uL 5.3  4.6  6.1   Hemoglobin 13.0 - 17.0 g/dL 15.3  13.1  14.8   Hematocrit 39.0 - 52.0 % 46.3  39.5  44.1   Platelets 150 - 400 K/uL 209  122  135       Latest Ref Rng & Units 11/11/2021    3:35 PM 11/06/2021    1:13 PM  09/09/2021    6:16 AM  CMP  Glucose 70 - 99 mg/dL 130   95   BUN 8 - 23 mg/dL 9   15   Creatinine 0.61 - 1.24 mg/dL 1.56  1.80  1.85   Sodium 135 - 145 mmol/L 140   139   Potassium 3.5 - 5.1 mmol/L  4.4   4.1   Chloride 98 - 111 mmol/L 105   110   CO2 22 - 32 mmol/L 27   26   Calcium 8.9 - 10.3 mg/dL 9.7   8.9   Total Protein 6.5 - 8.1 g/dL 7.5   5.8   Total Bilirubin 0.3 - 1.2 mg/dL 0.9   0.4   Alkaline Phos 38 - 126 U/L 88   53   AST 15 - 41 U/L 21   17   ALT 0 - 44 U/L 17   16     DIAGNOSTIC IMAGING:  I have independently reviewed the scans and discussed with the patient. CT Chest W Contrast  Result Date: 11/07/2021 CLINICAL DATA:  Follow-up pulmonary nodules. History of thymoma resection on 06/17/2021. completed adjuvant radiation therapy 09/11/2021. * Tracking Code: BO * EXAM: CT CHEST WITH CONTRAST TECHNIQUE: Multidetector CT imaging of the chest was performed during intravenous contrast administration. RADIATION DOSE REDUCTION: This exam was performed according to the departmental dose-optimization program which includes automated exposure control, adjustment of the mA and/or kV according to patient size and/or use of iterative reconstruction technique. CONTRAST:  52m OMNIPAQUE IOHEXOL 300 MG/ML  SOLN COMPARISON:  02/11/2021 screening chest CT.  03/07/2021 PET-CT. FINDINGS: Significantly motion degraded scan, limiting evaluation. Cardiovascular: Normal heart size. No significant pericardial effusion/thickening. Left anterior descending coronary atherosclerosis. Atherosclerotic nonaneurysmal thoracic aorta. Normal caliber pulmonary arteries. No central pulmonary emboli. Mediastinum/Nodes: No significant thyroid nodules. Unremarkable esophagus. No pathologically enlarged axillary, mediastinal or hilar lymph nodes. Interval resection of lobulated solid anterior mediastinal mass with no evidence of residual or recurrent anterior mediastinal mass. Lungs/Pleura: No pneumothorax. No pleural  effusion. Mild paraseptal and centrilobular emphysema. No acute consolidative airspace disease or lung masses. A few scattered bilateral pulmonary nodules are all stable since 02/11/2021 chest CT, including 0.7 cm left perifissural nodule (series 4/image 55) and 0.3 cm anterior right upper lobe nodule (series 4/image 40). Minimally thickened curvilinear nodular opacity in the dependent medial basilar left lower lobe (series 4/image 113) is stable. No new significant pulmonary nodules. Upper abdomen: Chronic calcified right adrenal gland compatible with remote insult. Musculoskeletal: No aggressive appearing focal osseous lesions. Mild thoracic spondylosis. IMPRESSION: 1. Significantly motion degraded scan, limiting evaluation. 2. Interval resection of lobulated solid anterior mediastinal mass with no evidence of residual or recurrent anterior mediastinal mass. 3. No findings suspicious for metastatic disease in the chest. 4. Scattered small bilateral pulmonary nodules are all stable since 02/11/2021 chest CT, presumably benign. Suggest resuming screening chest CT program in 6-12 months. 5. One vessel coronary atherosclerosis. 6. Aortic Atherosclerosis (ICD10-I70.0) and Emphysema (ICD10-J43.9). Electronically Signed   By: JIlona SorrelM.D.   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  No enlarged lymph nodes. - MRI of the chest with and without contrast on 03/06/2021: Heterogeneous, lobulated solid mass in the anterior mediastinum with faint contrast-enhancement measuring 4.4 x 2.8 cm.  Small areas of cystic change internally.  No enlarged mediastinal, hilar or axillary lymph nodes. - PET scan on 03/07/2021: Macrolobulated solid anterior mediastinal mass is hypermetabolic with SUV 9.4.  Thyroid is diffusely hypermetabolic without dominant mass with SUV 9.4.   Hypermetabolism anterior to the larynx could be muscular or related to atypical location of the node in this region including 6 mm SUV 3.9. - He  was evaluated by Dr. Kipp Brood.  Tumor markers show normal LDH and beta-hCG.  AFP was minimally high at 6.3 (less than 6.1). - No B symptoms. - Robotic assisted thoracoscopic resection of anterior mediastinal mass on 06/17/2021. - Pathology: 5.6 cm type B1 thymoma.  Margins negative for tumor.  Carcinoma appears to involve pericardium.    Social/family history: - He is a retired Engineer, structural. - Current active smoker, smoked 1 pack/day for 50 years, now smoking 2 cigarettes/day since last 1 month. - Brother died of thymic carcinoma.  Another brother had melanoma.  Maternal aunt had liver cancer.  Maternal grandmother had breast cancer.  Maternal grandfather had pancreatic cancer.  Paternal grandfather had colon cancer.   PLAN:  Stage II/ Masaoka stage III (T2Nx M0) thymoma: - He has completed postoperative radiation therapy. - Denies any clinical signs or symptoms of recurrence. - Reviewed his labs which showed elevated creatinine 1.56 stable.  CBC was normal.  CT chest from 11/06/2021 showed no findings suspicious for recurrence or metastatic disease.  I have recommended CT chest without contrast every 6 months for 2 years followed by annually for 5 to 10 years.  [place]  He was instructed to call us so we can refer him to local oncology clinic.  I will schedule him for follow-up visit in 6 months with CT chest without contrast and routine labs.   Orders placed this encounter:  Orders Placed This Encounter  Procedures   CT Chest Wo Contrast   CBC with Differential/Platelet   Comprehensive metabolic panel      Derek Jack, MD [place] [phone]

## 2021-11-20 NOTE — Patient Instructions (Addendum)
[facility]  Discharge Instructions  You were seen and examined today by Dr. Delton Coombes.  Dr. Delton Coombes discussed your most recent lab work and CT scan which revealed that everything looks good.  Repeat CT Chest in 6 months.  Follow-up as scheduled in 6 months with labs.    Thank you for choosing [facility] to provide your oncology and hematology care.   To afford each patient quality time with our provider, please arrive at least 15 minutes before your scheduled appointment time. You may need to reschedule your appointment if you arrive late (10 or more minutes). Arriving late affects you and other patients whose appointments are after yours.  Also, if you miss three or more appointments without notifying the office, you may be dismissed from the clinic at the provider's discretion.    Again, thank you for choosing [facility].  Our hope is that these requests will decrease the amount of time that you wait before being seen by our physicians.   If you have a lab appointment with the [facility] please come in thru the Main Entrance and check in at the main information desk.           _____________________________________________________________  Should you have questions after your visit to [facility], please contact our office at [phone] and follow the prompts.  Our office hours are 8:00 a.m. to 4:30 p.m. Monday - Thursday and 8:00 a.m. to 2:30 p.m. Friday.  Please note that voicemails left after 4:00 p.m. may not be returned until the following business day.  We are closed weekends and all major holidays.  You do have access to a nurse 24-7, just call the main number to the clinic [phone] and do not press any options, hold on the line and a nurse will answer the phone.    For prescription refill requests, have your pharmacy contact our office and allow 72 hours.    Masks are optional in  the cancer centers. If you would like for your care team to wear a mask while they are taking care of you, please let them know. You may have one support person who is at least 67 years old accompany you for your appointments.

## 2022-02-20 ENCOUNTER — Ambulatory Visit: Payer: Medicare HMO | Admitting: Family Medicine

## 2022-02-26 ENCOUNTER — Ambulatory Visit (INDEPENDENT_AMBULATORY_CARE_PROVIDER_SITE_OTHER): Payer: Medicare HMO

## 2022-02-26 VITALS — Ht 71.0 in | Wt 153.0 lb

## 2022-02-26 DIAGNOSIS — Z0000 Encounter for general adult medical examination without abnormal findings: Secondary | ICD-10-CM

## 2022-02-26 NOTE — Patient Instructions (Addendum)
[name] , Thank you for taking time to come for your Medicare Wellness Visit. I appreciate your ongoing commitment to your health goals. Please review the following plan we discussed and let me know if I can assist you in the future.   These are the goals we discussed:  Goals      Quit Smoking        This is a list of the screening recommended for you and due dates:  Health Maintenance  Topic Date Due   COVID-19 Vaccine (5 - 2023-24 season) 03/14/2022*   Colon Cancer Screening  02/27/2023*   Medicare Annual Wellness Visit  02/27/2023   DTaP/Tdap/Td vaccine (2 - Td or Tdap) 08/16/2028   Pneumonia Vaccine  Completed   Flu Shot  Completed   Hepatitis C Screening: USPSTF Recommendation to screen - Ages 18-79 yo.  Completed   Zoster (Shingles) Vaccine  Completed   HPV Vaccine  Aged Out  *Topic was postponed. The date shown is not the original due date.    Advanced directives: Forms are available if you choose in the future to pursue completion.  This is recommended in order to make sure that your health wishes are honored in the event that you are unable to verbalize them to the provider.    Conditions/risks identified: Aim for 30 minutes of exercise or brisk walking, 6-8 glasses of water, and 5 servings of fruits and vegetables each day.   Next appointment: Follow up in one year for your annual wellness visit.   I will have someone to contact you to discuss your complaint.   Preventive Care 91 Years and Older, Male  Preventive care refers to lifestyle choices and visits with your health care provider that can promote health and wellness. What does preventive care include? A yearly physical exam. This is also called an annual well check. Dental exams once or twice a year. Routine eye exams. Ask your health care provider how often you should have your eyes checked. Personal lifestyle choices, including: Daily care of your teeth and gums. Regular physical activity. Eating a  healthy diet. Avoiding tobacco and drug use. Limiting alcohol use. Practicing safe sex. Taking low doses of aspirin every day. Taking vitamin and mineral supplements as recommended by your health care provider. What happens during an annual well check? The services and screenings done by your health care provider during your annual well check will depend on your age, overall health, lifestyle risk factors, and family history of disease. Counseling  Your health care provider may ask you questions about your: Alcohol use. Tobacco use. Drug use. Emotional well-being. Home and relationship well-being. Sexual activity. Eating habits. History of falls. Memory and ability to understand (cognition). Work and work Statistician. Screening  You may have the following tests or measurements: Height, weight, and BMI. Blood pressure. Lipid and cholesterol levels. These may be checked every 5 years, or more frequently if you are over 32 years old. Skin check. Lung cancer screening. You may have this screening every year starting at age 64 if you have a 30-pack-year history of smoking and currently smoke or have quit within the past 15 years. Fecal occult blood test (FOBT) of the stool. You may have this test every year starting at age 58. Flexible sigmoidoscopy or colonoscopy. You may have a sigmoidoscopy every 5 years or a colonoscopy every 10 years starting at age 32. Prostate cancer screening. Recommendations will vary depending on your family history and other risks. Hepatitis C blood  test. Hepatitis B blood test. Sexually transmitted disease (STD) testing. Diabetes screening. This is done by checking your blood sugar (glucose) after you have not eaten for a while (fasting). You may have this done every 1-3 years. Abdominal aortic aneurysm (AAA) screening. You may need this if you are a current or former smoker. Osteoporosis. You may be screened starting at age 70 if you are at high risk. Talk  with your health care provider about your test results, treatment options, and if necessary, the need for more tests. Vaccines  Your health care provider may recommend certain vaccines, such as: Influenza vaccine. This is recommended every year. Tetanus, diphtheria, and acellular pertussis (Tdap, Td) vaccine. You may need a Td booster every 10 years. Zoster vaccine. You may need this after age 53. Pneumococcal 13-valent conjugate (PCV13) vaccine. One dose is recommended after age 1. Pneumococcal polysaccharide (PPSV23) vaccine. One dose is recommended after age 35. Talk to your health care provider about which screenings and vaccines you need and how often you need them. This information is not intended to replace advice given to you by your health care provider. Make sure you discuss any questions you have with your health care provider. Document Released: 02/16/2015 Document Revised: 10/10/2015 Document Reviewed: 11/21/2014 Elsevier Interactive Patient Education  2017 [place] Prevention in the Home Falls can cause injuries. They can happen to people of all ages. There are many things you can do to make your home safe and to help prevent falls. What can I do on the outside of my home? Regularly fix the edges of walkways and driveways and fix any cracks. Remove anything that might make you trip as you walk through a door, such as a raised step or threshold. Trim any bushes or trees on the path to your home. Use bright outdoor lighting. Clear any walking paths of anything that might make someone trip, such as rocks or tools. Regularly check to see if handrails are loose or broken. Make sure that both sides of any steps have handrails. Any raised decks and porches should have guardrails on the edges. Have any leaves, snow, or ice cleared regularly. Use sand or salt on walking paths during winter. Clean up any spills in your garage right away. This includes oil or grease  spills. What can I do in the bathroom? Use night lights. Install grab bars by the toilet and in the tub and shower. Do not use towel bars as grab bars. Use non-skid mats or decals in the tub or shower. If you need to sit down in the shower, use a plastic, non-slip stool. Keep the floor dry. Clean up any water that spills on the floor as soon as it happens. Remove soap buildup in the tub or shower regularly. Attach bath mats securely with double-sided non-slip rug tape. Do not have throw rugs and other things on the floor that can make you trip. What can I do in the bedroom? Use night lights. Make sure that you have a light by your bed that is easy to reach. Do not use any sheets or blankets that are too big for your bed. They should not hang down onto the floor. Have a firm chair that has side arms. You can use this for support while you get dressed. Do not have throw rugs and other things on the floor that can make you trip. What can I do in the kitchen? Clean up any spills right away. Avoid walking on wet  floors. Keep items that you use a lot in easy-to-reach places. If you need to reach something above you, use a strong step stool that has a grab bar. Keep electrical cords out of the way. Do not use floor polish or wax that makes floors slippery. If you must use wax, use non-skid floor wax. Do not have throw rugs and other things on the floor that can make you trip. What can I do with my stairs? Do not leave any items on the stairs. Make sure that there are handrails on both sides of the stairs and use them. Fix handrails that are broken or loose. Make sure that handrails are as long as the stairways. Check any carpeting to make sure that it is firmly attached to the stairs. Fix any carpet that is loose or worn. Avoid having throw rugs at the top or bottom of the stairs. If you do have throw rugs, attach them to the floor with carpet tape. Make sure that you have a light switch at the  top of the stairs and the bottom of the stairs. If you do not have them, ask someone to add them for you. What else can I do to help prevent falls? Wear shoes that: Do not have high heels. Have rubber bottoms. Are comfortable and fit you well. Are closed at the toe. Do not wear sandals. If you use a stepladder: Make sure that it is fully opened. Do not climb a closed stepladder. Make sure that both sides of the stepladder are locked into place. Ask someone to hold it for you, if possible. Clearly mark and make sure that you can see: Any grab bars or handrails. First and last steps. Where the edge of each step is. Use tools that help you move around (mobility aids) if they are needed. These include: Canes. Walkers. Scooters. Crutches. Turn on the lights when you go into a dark area. Replace any light bulbs as soon as they burn out. Set up your furniture so you have a clear path. Avoid moving your furniture around. If any of your floors are uneven, fix them. If there are any pets around you, be aware of where they are. Review your medicines with your doctor. Some medicines can make you feel dizzy. This can increase your chance of falling. Ask your doctor what other things that you can do to help prevent falls. This information is not intended to replace advice given to you by your health care provider. Make sure you discuss any questions you have with your health care provider. Document Released: 11/16/2008 Document Revised: 06/28/2015 Document Reviewed: 02/24/2014 Elsevier Interactive Patient Education  2017 [employer].

## 2022-02-26 NOTE — Progress Notes (Signed)
Subjective:   Antonio Scott is a 67 y.o. male who presents for Medicare Annual/Subsequent preventive examination.  I connected with  Antonio Scott on 02/26/22 by a audio enabled telemedicine application and verified that I am speaking with the correct person using two identifiers.  Patient Location: Home  Provider Location: Home Office  I discussed the limitations of evaluation and management by telemedicine. The patient expressed understanding and agreed to proceed.  Review of Systems     Cardiac Risk Factors include: advanced age (>67mn, >>77women);male gender;smoking/ tobacco exposure     Objective:    Today's Vitals   02/26/22 1212  Weight: 153 lb (69.4 kg)  Height: '5\' 11"'$  (1.803 m)   Body mass index is 21.34 kg/m.     02/26/2022    2:26 PM 11/20/2021    3:45 PM 09/07/2021    2:08 PM 07/17/2021    9:56 AM 07/10/2021    1:15 PM 06/14/2021    2:14 PM 04/08/2021    8:18 AM  Advanced Directives  Does Patient Have a Medical Advance Directive? No No No No No No No  Does patient want to make changes to medical advance directive?     No - Patient declined No - Patient declined   Would patient like information on creating a medical advance directive? No - Patient declined No - Patient declined   No - Patient declined  No - Patient declined    Current Medications (verified) Outpatient Encounter Medications as of 02/26/2022  Medication Sig   levothyroxine (SYNTHROID) 75 MCG tablet Take 1 tablet (75 mcg total) by mouth daily before breakfast. (Patient not taking: Reported on 02/26/2022)   No facility-administered encounter medications on file as of 02/26/2022.    Allergies (verified) Patient has no known allergies.   History: Past Medical History:  Diagnosis Date   Anxiety    History of external beam radiation therapy    Mediastinum- 08/01/21-09/11/21-Dr. JGery Pray  Hypothyroidism    Past Surgical History:  Procedure Laterality Date   INTERCOSTAL NERVE BLOCK Right  06/17/2021   Procedure: INTERCOSTAL NERVE BLOCK;  Surgeon: LLajuana Matte MD;  Location: [facility] OR;  Service: Thoracic;  Laterality: Right;   SKIN LESION EXCISION     Family History  Problem Relation Age of Onset   Stroke Mother    Diabetes Mother    Cancer Father    Cancer Brother    Liver cancer Maternal Aunt    Colon cancer Paternal Grandfather    Social History   Socioeconomic History   Marital status: Single    Spouse name: Not on file   Number of children: 0   Years of education: Not on file   Highest education level: Not on file  Occupational History   Occupation: retired  Tobacco Use   Smoking status: Every Day    Packs/day: 0.20    Years: 50.00    Total pack years: 10.00    Types: Cigarettes   Smokeless tobacco: Never   Tobacco comments:    Currently smoking 7-10 cig daily  Vaping Use   Vaping Use: Never used  Substance and Sexual Activity   Alcohol use: Not Currently   Drug use: Not Currently   Sexual activity: Not Currently  Other Topics Concern   Not on file  Social History Narrative   No children   Brother lives a mile away   Social Determinants of Health   Financial Resource Strain: Low Risk  (02/26/2022)  Overall Financial Resource Strain (CARDIA)    Difficulty of Paying Living Expenses: Not hard at all  Food Insecurity: No Food Insecurity (02/26/2022)   Hunger Vital Sign    Worried About Running Out of Food in the Last Year: Never true    Ran Out of Food in the Last Year: Never true  Transportation Needs: No Transportation Needs (02/26/2022)   PRAPARE - Hydrologist (Medical): No    Lack of Transportation (Non-Medical): No  Physical Activity: Insufficiently Active (02/26/2022)   Exercise Vital Sign    Days of Exercise per Week: 3 days    Minutes of Exercise per Session: 30 min  Stress: No Stress Concern Present (02/26/2022)   [place]    Feeling  of Stress : Not at all  Social Connections: Socially Isolated (02/26/2022)   Social Connection and Isolation Panel [NHANES]    Frequency of Communication with Friends and Family: Once a week    Frequency of Social Gatherings with Friends and Family: Once a week    Attends Religious Services: Never    Marine scientist or Organizations: No    Attends Music therapist: Never    Marital Status: Never married    Tobacco Counseling Ready to quit: Not Answered Counseling given: Not Answered Tobacco comments: Currently smoking 7-10 cig daily   Clinical Intake:  Pre-visit preparation completed: Yes  Pain : No/denies pain  Diabetes: No  How often do you need to have someone help you when you read instructions, pamphlets, or other written materials from your doctor or pharmacy?: 1 - Never  Diabetic?No   Interpreter Needed?: No  Information entered by :: Denman George LPN   Activities of Daily Living    02/26/2022    2:26 PM 06/14/2021    2:19 PM  In your present state of health, do you have any difficulty performing the following activities:  Hearing? 0   Vision? 0   Difficulty concentrating or making decisions? 0   Walking or climbing stairs? 0   Dressing or bathing? 0   Doing errands, shopping? 0 0  Preparing Food and eating ? N   Using the Toilet? N   In the past six months, have you accidently leaked urine? N   Do you have problems with loss of bowel control? N   Managing your Medications? N   Managing your Finances? N   Housekeeping or managing your Housekeeping? N     Patient Care Team: Baruch Gouty, FNP as PCP - General (Family Medicine) Eloise Harman, DO as Consulting Physician (Gastroenterology) Derek Jack, MD as Medical Oncologist (Medical Oncology) Brien Mates, RN as Oncology Nurse Navigator (Medical Oncology)  Indicate any recent Medical Services you may have received from other than [facility] providers in the past year  (date may be approximate).     Assessment:   This is a routine wellness examination for Antonio Scott.  Hearing/Vision screen Hearing Screening - Comments:: Denies hearing difficulties  Vision Screening - Comments:: Denies vision problems and plans to schedule routine exam this year    Dietary issues and exercise activities discussed: Current Exercise Habits: Home exercise routine, Type of exercise: walking, Time (Minutes): 30, Frequency (Times/Week): 5, Weekly Exercise (Minutes/Week): 150, Intensity: Mild   Goals Addressed   None   Depression Screen    02/26/2022    2:15 PM 08/20/2021    4:28 PM 02/25/2021    3:43  PM 02/19/2021    3:25 PM 11/16/2020    3:13 PM  PHQ 2/9 Scores  PHQ - 2 Score 0 1 0 0 0  PHQ- 9 Score  5 0 0 2    Fall Risk    02/26/2022   12:16 PM 08/20/2021    4:31 PM 02/25/2021    3:35 PM 02/19/2021    3:25 PM 11/16/2020    3:14 PM  [place] in the past year? 0 0 0 0 0  Number falls in past yr: 0  0    Injury with Fall? 0  0    Risk for fall due to :   No Fall Risks    Follow up Falls prevention discussed;Education provided;Falls evaluation completed  Falls prevention discussed      FALL RISK PREVENTION PERTAINING TO THE HOME:  Any stairs in or around the home? No  If so, are there any without handrails? No  Home free of loose throw rugs in walkways, pet beds, electrical cords, etc? Yes  Adequate lighting in your home to reduce risk of falls? Yes   ASSISTIVE DEVICES UTILIZED TO PREVENT FALLS:  Life alert? No  Use of a cane, walker or w/c? No  Grab bars in the bathroom? No  Shower chair or bench in shower? No  Elevated toilet seat or a handicapped toilet? Yes   TIMED UP AND GO:  Was the test performed? No . Telephonic visit   Cognitive Function:        02/26/2022    2:26 PM 02/25/2021    3:38 PM  6CIT Screen  What Year? 0 points 0 points  What month? 0 points 0 points  What time? 0 points 0 points  Count back from 20 0 points 0 points   Months in reverse 0 points 0 points  Repeat phrase 0 points 0 points  Total Score 0 points 0 points    Immunizations Immunization History  Administered Date(s) Administered   Fluad Quad(high Dose 65+) 11/15/2020, 10/27/2021   Moderna Covid-19 Vaccine Bivalent Booster 66yr & up 11/16/2020   PFIZER(Purple Top)SARS-COV-2 Vaccination 05/19/2019, 06/10/2019, 01/13/2020   PNEUMOCOCCAL CONJUGATE-20 03/13/2021   RSV,unspecified 10/27/2021   Tdap 08/17/2018   Zoster Recombinat (Shingrix) 01/15/2021, 04/17/2021    TDAP status: Up to date  Flu Vaccine status: Up to date  Pneumococcal vaccine status: Up to date  Covid-19 vaccine status: Information provided on how to obtain vaccines.   Qualifies for Shingles Vaccine? Yes   Zostavax completed No   Shingrix Completed?: Yes  Screening Tests Health Maintenance  Topic Date Due   COVID-19 Vaccine (5 - 2023-24 season) 03/14/2022 (Originally 10/04/2021)   COLONOSCOPY (Pts 45-438yrInsurance coverage will need to be confirmed)  02/27/2023 (Originally 07/30/1999)   Medicare Annual Wellness (AWV)  02/27/2023   DTaP/Tdap/Td (2 - Td or Tdap) 08/16/2028   Pneumonia Vaccine 6563Years old  Completed   INFLUENZA VACCINE  Completed   Hepatitis C Screening  Completed   Zoster Vaccines- Shingrix  Completed   HPV VACCINES  Aged Out    Health Maintenance  There are no preventive care reminders to display for this patient.   Colorectal cancer screening:  Patient declines at this time   Lung Cancer Screening: (Low Dose CT Chest recommended if Age 68-80ears, 30 pack-year currently smoking OR have quit w/in 15years.) does qualify.   Lung Cancer Screening Referral: will discuss with provider at next appointment   Additional Screening:  Hepatitis C  Screening: does qualify; Completed 11/15/20  Vision Screening: Recommended annual ophthalmology exams for early detection of glaucoma and other disorders of the eye. Is the patient up to date with  their annual eye exam?  No  Who is the provider or what is the name of the office in which the patient attends annual eye exams? None  If pt is not established with a provider, would they like to be referred to a provider to establish care? No .   Dental Screening: Recommended annual dental exams for proper oral hygiene  Community Resource Referral / Chronic Care Management: CRR required this visit?  No   CCM required this visit?  No      Plan:     I have personally reviewed and noted the following in the patient's chart:   Medical and social history Use of alcohol, tobacco or illicit drugs  Current medications and supplements including opioid prescriptions. Patient is not currently taking opioid prescriptions. Functional ability and status Nutritional status Physical activity Advanced directives List of other physicians Hospitalizations, surgeries, and ER visits in previous 12 months Vitals Screenings to include cognitive, depression, and falls Referrals and appointments  In addition, I have reviewed and discussed with patient certain preventive protocols, quality metrics, and best practice recommendations. A written personalized care plan for preventive services as well as general preventive health recommendations were provided to patient.     Vanetta Mulders, [place]   7/71/1657   Due to this being a virtual visit, the after visit summary with patients personalized plan was offered to patient via mail or my-chart. per request, patient was mailed a copy of AVS  Nurse Notes: Patient with concerns from a previous admission that he would like to discuss with someone in management.  Information passed to program leader.

## 2022-03-07 ENCOUNTER — Telehealth (INDEPENDENT_AMBULATORY_CARE_PROVIDER_SITE_OTHER): Payer: Medicare HMO | Admitting: Family Medicine

## 2022-03-07 ENCOUNTER — Encounter: Payer: Self-pay | Admitting: Family Medicine

## 2022-03-07 DIAGNOSIS — Z9089 Acquired absence of other organs: Secondary | ICD-10-CM

## 2022-03-07 DIAGNOSIS — E782 Mixed hyperlipidemia: Secondary | ICD-10-CM

## 2022-03-07 DIAGNOSIS — E039 Hypothyroidism, unspecified: Secondary | ICD-10-CM | POA: Diagnosis not present

## 2022-03-07 DIAGNOSIS — E538 Deficiency of other specified B group vitamins: Secondary | ICD-10-CM

## 2022-03-07 DIAGNOSIS — E519 Thiamine deficiency, unspecified: Secondary | ICD-10-CM

## 2022-03-07 DIAGNOSIS — D4989 Neoplasm of unspecified behavior of other specified sites: Secondary | ICD-10-CM | POA: Diagnosis not present

## 2022-03-07 NOTE — Progress Notes (Signed)
Virtual Visit via telephone Note Due to COVID-19 pandemic this visit was conducted virtually. This visit type was conducted due to national recommendations for restrictions regarding the COVID-19 Pandemic (e.g. social distancing, sheltering in place) in an effort to limit this patient's exposure and mitigate transmission in our community. All issues noted in this document were discussed and addressed.  A physical exam was not performed with this format.   I connected with Antonio Scott on 03/07/2022 at 1100 by telephone and verified that I am speaking with the correct person using two identifiers. Antonio Scott is currently located at home and patient is currently with them during visit. The provider, Monia Pouch, FNP is located in their office at time of visit.  I discussed the limitations, risks, security and privacy concerns of performing an evaluation and management service by virtual visit and the availability of in person appointments. I also discussed with the patient that there may be a patient responsible charge related to this service. The patient expressed understanding and agreed to proceed.  Subjective:  Patient ID: Antonio Scott, male    DOB: 08/12/1955, 67 y.o.   MRN: [id number]  Chief Complaint:  Medical Management of Chronic Issues   HPI: Antonio Scott is a 67 y.o. male presenting on 03/07/2022 for Medical Management of Chronic Issues   1. Acquired hypothyroidism Levothyroxine dose was increased to 75 mcg during last hospital admission. Pt reports he is still taking the 25 mcg dosing daily as he read it off of his prescription bottle today. States he was unaware this had been increased. He denies fatigue, malaise, constipation, hot or cold intolerance, memory changes, or other hypothyroid symptoms.   2. Mixed hyperlipidemia Pt is not on statin therapy. He does not follow a diet or exercise routine.   3. Thymoma 4. S/P thymectomy He is s/p surgery and radiation therapy.  Followed by oncology on a regular basis. Denies weight changes, fatigue, night sweats, weakness, or confusion. No chest pain or shortness of breath.   5. Thiamin deficiency He is on repletion therapy but states he has not picked up his recent refill and will get that and restart.   6. Vitamin B 12 deficiency Has not been taking Vit B 12 repletion as instructed at time of hospital discharge. States he was unaware that he was supposed to continue taking the medications.    Relevant past medical, surgical, family, and social history reviewed and updated as indicated.  Allergies and medications reviewed and updated.   Past Medical History:  Diagnosis Date   Anxiety    History of external beam radiation therapy    Mediastinum- 08/01/21-09/11/21-Dr. Gery Pray   Hypothyroidism     Past Surgical History:  Procedure Laterality Date   INTERCOSTAL NERVE BLOCK Right 06/17/2021   Procedure: INTERCOSTAL NERVE BLOCK;  Surgeon: Lajuana Matte, MD;  Location: [facility] OR;  Service: Thoracic;  Laterality: Right;   SKIN LESION EXCISION      Social History   Socioeconomic History   Marital status: Single    Spouse name: Not on file   Number of children: 0   Years of education: Not on file   Highest education level: Not on file  Occupational History   Occupation: retired  Tobacco Use   Smoking status: Every Day    Packs/day: 0.20    Years: 50.00    Total pack years: 10.00    Types: Cigarettes   Smokeless tobacco: Never   Tobacco comments:  Currently smoking 7-10 cig daily  Vaping Use   Vaping Use: Never used  Substance and Sexual Activity   Alcohol use: Not Currently   Drug use: Not Currently   Sexual activity: Not Currently  Other Topics Concern   Not on file  Social History Narrative   No children   Brother lives a mile away   Social Determinants of Health   Financial Resource Strain: Low Risk  (02/26/2022)   Overall Financial Resource Strain (CARDIA)    Difficulty of  Paying Living Expenses: Not hard at all  Food Insecurity: No Food Insecurity (02/26/2022)   Hunger Vital Sign    Worried About Running Out of Food in the Last Year: Never true    Ran Out of Food in the Last Year: Never true  Transportation Needs: No Transportation Needs (02/26/2022)   PRAPARE - Hydrologist (Medical): No    Lack of Transportation (Non-Medical): No  Physical Activity: Insufficiently Active (02/26/2022)   Exercise Vital Sign    Days of Exercise per Week: 3 days    Minutes of Exercise per Session: 30 min  Stress: No Stress Concern Present (02/26/2022)   [name]    Feeling of Stress : Not at all  Social Connections: Socially Isolated (02/26/2022)   Social Connection and Isolation Panel [NHANES]    Frequency of Communication with Friends and Family: Once a week    Frequency of Social Gatherings with Friends and Family: Once a week    Attends Religious Services: Never    Marine scientist or Organizations: No    Attends Archivist Meetings: Never    Marital Status: Never married  Intimate Partner Violence: Not At Risk (02/26/2022)   Humiliation, Afraid, Rape, and Kick questionnaire    Fear of Current or Ex-Partner: No    Emotionally Abused: No    Physically Abused: No    Sexually Abused: No    Outpatient Encounter Medications as of 03/07/2022  Medication Sig   levothyroxine (SYNTHROID) 25 MCG tablet Take 25 mcg by mouth daily before breakfast.   thiamine (VITAMIN B1) 100 MG tablet Take 100 mg by mouth daily.   [DISCONTINUED] levothyroxine (SYNTHROID) 75 MCG tablet Take 1 tablet (75 mcg total) by mouth daily before breakfast. (Patient not taking: Reported on 02/26/2022)   No facility-administered encounter medications on file as of 03/07/2022.    No Known Allergies  Review of Systems  Constitutional:  Negative for activity change, appetite change, chills,  diaphoresis, fatigue, fever and unexpected weight change.  HENT: Negative.    Eyes: Negative.  Negative for photophobia and visual disturbance.  Respiratory:  Negative for cough, chest tightness and shortness of breath.   Cardiovascular:  Negative for chest pain, palpitations and leg swelling.  Gastrointestinal:  Negative for abdominal pain, blood in stool, constipation, diarrhea, nausea and vomiting.  Endocrine: Negative.   Genitourinary:  Negative for decreased urine volume, difficulty urinating, dysuria, frequency and urgency.  Musculoskeletal:  Negative for arthralgias and myalgias.  Skin: Negative.   Allergic/Immunologic: Negative.   Neurological:  Negative for dizziness, tremors, seizures, syncope, facial asymmetry, speech difficulty, weakness, light-headedness, numbness and headaches.  Hematological: Negative.   Psychiatric/Behavioral:  Negative for confusion, hallucinations, sleep disturbance and suicidal ideas.   All other systems reviewed and are negative.        Observations/Objective: No vital signs or physical exam, this was a virtual health encounter.  Pt alert and  oriented, answers all questions appropriately, and able to speak in full sentences.    Assessment and Plan: Kmarion was seen today for medical management of chronic issues.  Diagnoses and all orders for this visit:  Acquired hypothyroidism Was last prescribed 75 mcg levothyroxine but has continued to take 25 mcg daily. Will repeat labs and adjust medications if warranted.  -     Lipid panel -     Thyroid Panel With TSH  Mixed hyperlipidemia Labs pending. Will start medications if warranted. Diet and exercise encouraged.  -     CMP14+EGFR -     Lipid panel -     Thyroid Panel With TSH  Thymoma S/P thymectomy Doing well post surgery and radiation therapy. Followed by oncology on a regular basis. Will repeat labs as he has not had any in several months.  -     CMP14+EGFR -     CBC with  Differential/Platelet -     Thyroid Panel With TSH -     Vitamin B12 -     Folate -     Vitamin B1  Thiamin deficiency Labs pending, will adjust repletion therapy if warranted.  -     CBC with Differential/Platelet -     Thyroid Panel With TSH -     Vitamin B12 -     Folate -     Vitamin B1  Vitamin B 12 deficiency Labs pending, will restart repletion therapy if warranted.  -     CBC with Differential/Platelet -     Thyroid Panel With TSH -     Vitamin B12 -     Folate -     Vitamin B1     Follow Up Instructions: Return in about 3 months (around 06/05/2022) for chronic follow up .    I discussed the assessment and treatment plan with the patient. The patient was provided an opportunity to ask questions and all were answered. The patient agreed with the plan and demonstrated an understanding of the instructions.   The patient was advised to call back or seek an in-person evaluation if the symptoms worsen or if the condition fails to improve as anticipated.  The above assessment and management plan was discussed with the patient. The patient verbalized understanding of and has agreed to the management plan. Patient is aware to call the clinic if they develop any new symptoms or if symptoms persist or worsen. Patient is aware when to return to the clinic for a follow-up visit. Patient educated on when it is appropriate to go to the emergency department.    I provided 25 minutes of time during this telephone encounter.   Monia Pouch, FNP-C [facility] [address] [place], [place] [address] [phone] 03/07/2022

## 2022-03-07 NOTE — Addendum Note (Signed)
Addended by: Baruch Gouty on: 03/07/2022 02:57 PM   Modules accepted: Orders

## 2022-03-08 LAB — CBC WITH DIFFERENTIAL/PLATELET
Basophils Absolute: 0.1 10*3/uL (ref 0.0–0.2)
Basos: 1 %
EOS (ABSOLUTE): 0.2 10*3/uL (ref 0.0–0.4)
Eos: 2 %
Hematocrit: 46.6 % (ref 37.5–51.0)
Hemoglobin: 15.6 g/dL (ref 13.0–17.7)
Immature Grans (Abs): 0 10*3/uL (ref 0.0–0.1)
Immature Granulocytes: 0 %
Lymphocytes Absolute: 0.7 10*3/uL (ref 0.7–3.1)
Lymphs: 11 %
MCH: 31 pg (ref 26.6–33.0)
MCHC: 33.5 g/dL (ref 31.5–35.7)
MCV: 93 fL (ref 79–97)
Monocytes Absolute: 0.5 10*3/uL (ref 0.1–0.9)
Monocytes: 8 %
Neutrophils Absolute: 4.8 10*3/uL (ref 1.4–7.0)
Neutrophils: 78 %
Platelets: 173 10*3/uL (ref 150–450)
RBC: 5.03 x10E6/uL (ref 4.14–5.80)
RDW: 12.4 % (ref 11.6–15.4)
WBC: 6.2 10*3/uL (ref 3.4–10.8)

## 2022-03-08 LAB — CMP14+EGFR
ALT: 16 IU/L (ref 0–44)
AST: 20 IU/L (ref 0–40)
Albumin/Globulin Ratio: 1.7 (ref 1.2–2.2)
Albumin: 4.5 g/dL (ref 3.9–4.9)
Alkaline Phosphatase: 101 IU/L (ref 44–121)
BUN/Creatinine Ratio: 9 — ABNORMAL LOW (ref 10–24)
BUN: 15 mg/dL (ref 8–27)
Bilirubin Total: 0.4 mg/dL (ref 0.0–1.2)
CO2: 25 mmol/L (ref 20–29)
Calcium: 9.6 mg/dL (ref 8.6–10.2)
Chloride: 101 mmol/L (ref 96–106)
Creatinine, Ser: 1.68 mg/dL — ABNORMAL HIGH (ref 0.76–1.27)
Globulin, Total: 2.6 g/dL (ref 1.5–4.5)
Glucose: 90 mg/dL (ref 70–99)
Potassium: 4.7 mmol/L (ref 3.5–5.2)
Sodium: 137 mmol/L (ref 134–144)
Total Protein: 7.1 g/dL (ref 6.0–8.5)
eGFR: 44 mL/min/{1.73_m2} — ABNORMAL LOW (ref >59)

## 2022-03-08 LAB — LIPID PANEL
Chol/HDL Ratio: 2 ratio (ref 0.0–5.0)
Cholesterol, Total: 166 mg/dL (ref 100–199)
HDL: 81 mg/dL (ref >39)
LDL Chol Calc (NIH): 70 mg/dL (ref 0–99)
Triglycerides: 84 mg/dL (ref 0–149)
VLDL Cholesterol Cal: 15 mg/dL (ref 5–40)

## 2022-03-08 LAB — THYROID PANEL WITH TSH
Free Thyroxine Index: 2 (ref 1.2–4.9)
T3 Uptake Ratio: 32 % (ref 24–39)
T4, Total: 6.1 ug/dL (ref 4.5–12.0)
TSH: 9.61 u[IU]/mL — ABNORMAL HIGH (ref 0.450–4.500)

## 2022-03-08 LAB — FOLATE: Folate: 4.2 ng/mL (ref >3.0)

## 2022-03-08 LAB — VITAMIN B12: Vitamin B-12: 262 pg/mL (ref 232–1245)

## 2022-03-11 MED ORDER — LEVOTHYROXINE SODIUM 50 MCG PO TABS: 50.0000 ug | ORAL_TABLET | Freq: Every day | ORAL | 3 refills | Status: DC

## 2022-03-11 NOTE — Addendum Note (Signed)
Addended by: Baruch Gouty on: 03/11/2022 04:12 PM   Modules accepted: Orders

## 2022-03-13 LAB — VITAMIN B1: Thiamine: 192.6 nmol/L (ref 66.5–200.0)

## 2022-03-14 ENCOUNTER — Telehealth: Payer: Self-pay | Admitting: Family Medicine

## 2022-03-14 NOTE — Telephone Encounter (Signed)
Patient stated that he has not talked to anyone about lab results, would like to speak with someone about results and his medication. Please call back.

## 2022-03-14 NOTE — Telephone Encounter (Signed)
[email or web address]

## 2022-03-17 NOTE — Telephone Encounter (Signed)
Reviewed labs with patient and patient voiced understanding.

## 2022-04-18 ENCOUNTER — Telehealth: Payer: Self-pay | Admitting: Family Medicine

## 2022-04-18 NOTE — Telephone Encounter (Signed)
Contacted Antonio Scott to schedule their annual wellness visit. Appointment made for 03/02/2023.  Thank you,  [name],  [place] Direct Dial ??[phone]

## 2022-05-22 ENCOUNTER — Inpatient Hospital Stay: Payer: Medicare HMO | Attending: Hematology

## 2022-05-22 ENCOUNTER — Ambulatory Visit (HOSPITAL_COMMUNITY)
Admission: RE | Admit: 2022-05-22 | Discharge: 2022-05-22 | Disposition: A | Discharge status: Home / Self Care | Payer: Medicare HMO | Source: Ambulatory Visit | Attending: Hematology | Admitting: Hematology

## 2022-05-22 DIAGNOSIS — D4989 Neoplasm of unspecified behavior of other specified sites: Secondary | ICD-10-CM | POA: Insufficient documentation

## 2022-05-22 DIAGNOSIS — I2510 Atherosclerotic heart disease of native coronary artery without angina pectoris: Secondary | ICD-10-CM | POA: Insufficient documentation

## 2022-05-22 DIAGNOSIS — F17210 Nicotine dependence, cigarettes, uncomplicated: Secondary | ICD-10-CM | POA: Diagnosis not present

## 2022-05-22 DIAGNOSIS — J9859 Other diseases of mediastinum, not elsewhere classified: Secondary | ICD-10-CM

## 2022-05-22 DIAGNOSIS — J439 Emphysema, unspecified: Secondary | ICD-10-CM | POA: Diagnosis not present

## 2022-05-22 DIAGNOSIS — Z85238 Personal history of other malignant neoplasm of thymus: Secondary | ICD-10-CM | POA: Diagnosis not present

## 2022-05-22 DIAGNOSIS — Z803 Family history of malignant neoplasm of breast: Secondary | ICD-10-CM | POA: Insufficient documentation

## 2022-05-22 DIAGNOSIS — Z800 Family history of malignant neoplasm of digestive organs: Secondary | ICD-10-CM | POA: Diagnosis not present

## 2022-05-22 DIAGNOSIS — Z923 Personal history of irradiation: Secondary | ICD-10-CM | POA: Insufficient documentation

## 2022-05-22 DIAGNOSIS — I700 Atherosclerosis of aorta: Secondary | ICD-10-CM | POA: Diagnosis not present

## 2022-05-22 DIAGNOSIS — R911 Solitary pulmonary nodule: Secondary | ICD-10-CM | POA: Insufficient documentation

## 2022-05-22 DIAGNOSIS — J9811 Atelectasis: Secondary | ICD-10-CM | POA: Diagnosis not present

## 2022-05-22 LAB — COMPREHENSIVE METABOLIC PANEL
ALT: 19 U/L (ref 0–44)
AST: 19 U/L (ref 15–41)
Albumin: 4.3 g/dL (ref 3.5–5.0)
Alkaline Phosphatase: 89 U/L (ref 38–126)
Anion gap: 8 (ref 5–15)
BUN: 13 mg/dL (ref 8–23)
CO2: 27 mmol/L (ref 22–32)
Calcium: 9.3 mg/dL (ref 8.9–10.3)
Chloride: 100 mmol/L (ref 98–111)
Creatinine, Ser: 1.87 mg/dL — ABNORMAL HIGH (ref 0.61–1.24)
GFR, Estimated: 39 mL/min — ABNORMAL LOW (ref >60)
Glucose, Bld: 77 mg/dL (ref 70–99)
Potassium: 4.3 mmol/L (ref 3.5–5.1)
Sodium: 135 mmol/L (ref 135–145)
Total Bilirubin: 0.4 mg/dL (ref 0.3–1.2)
Total Protein: 7.3 g/dL (ref 6.5–8.1)

## 2022-05-22 LAB — CBC WITH DIFFERENTIAL/PLATELET
Abs Immature Granulocytes: 0.02 10*3/uL (ref 0.00–0.07)
Basophils Absolute: 0.1 10*3/uL (ref 0.0–0.1)
Basophils Relative: 1 %
Eosinophils Absolute: 0.2 10*3/uL (ref 0.0–0.5)
Eosinophils Relative: 3 %
HCT: 47.5 % (ref 39.0–52.0)
Hemoglobin: 15.9 g/dL (ref 13.0–17.0)
Immature Granulocytes: 0 %
Lymphocytes Relative: 11 %
Lymphs Abs: 0.6 10*3/uL — ABNORMAL LOW (ref 0.7–4.0)
MCH: 31.3 pg (ref 26.0–34.0)
MCHC: 33.5 g/dL (ref 30.0–36.0)
MCV: 93.5 fL (ref 80.0–100.0)
Monocytes Absolute: 0.4 10*3/uL (ref 0.1–1.0)
Monocytes Relative: 7 %
Neutro Abs: 4.3 10*3/uL (ref 1.7–7.7)
Neutrophils Relative %: 78 %
Platelets: 158 10*3/uL (ref 150–400)
RBC: 5.08 MIL/uL (ref 4.22–5.81)
RDW: 13.1 % (ref 11.5–15.5)
WBC: 5.6 10*3/uL (ref 4.0–10.5)
nRBC: 0 % (ref 0.0–0.2)

## 2022-05-22 LAB — LACTATE DEHYDROGENASE: LDH: 169 U/L (ref 98–192)

## 2022-05-29 ENCOUNTER — Inpatient Hospital Stay (HOSPITAL_BASED_OUTPATIENT_CLINIC_OR_DEPARTMENT_OTHER): Payer: Medicare HMO | Admitting: Hematology

## 2022-05-29 VITALS — BP 106/77 | HR 78 | Temp 98.4°F | Resp 17 | Ht 71.0 in | Wt 159.0 lb

## 2022-05-29 DIAGNOSIS — J9859 Other diseases of mediastinum, not elsewhere classified: Secondary | ICD-10-CM | POA: Diagnosis not present

## 2022-05-29 DIAGNOSIS — Z923 Personal history of irradiation: Secondary | ICD-10-CM | POA: Diagnosis not present

## 2022-05-29 DIAGNOSIS — D4989 Neoplasm of unspecified behavior of other specified sites: Secondary | ICD-10-CM

## 2022-05-29 DIAGNOSIS — Z800 Family history of malignant neoplasm of digestive organs: Secondary | ICD-10-CM | POA: Diagnosis not present

## 2022-05-29 DIAGNOSIS — R911 Solitary pulmonary nodule: Secondary | ICD-10-CM | POA: Diagnosis not present

## 2022-05-29 DIAGNOSIS — Z803 Family history of malignant neoplasm of breast: Secondary | ICD-10-CM | POA: Diagnosis not present

## 2022-05-29 DIAGNOSIS — I700 Atherosclerosis of aorta: Secondary | ICD-10-CM | POA: Diagnosis not present

## 2022-05-29 DIAGNOSIS — I2510 Atherosclerotic heart disease of native coronary artery without angina pectoris: Secondary | ICD-10-CM | POA: Diagnosis not present

## 2022-05-29 DIAGNOSIS — Z85238 Personal history of other malignant neoplasm of thymus: Secondary | ICD-10-CM | POA: Diagnosis not present

## 2022-05-29 DIAGNOSIS — F17210 Nicotine dependence, cigarettes, uncomplicated: Secondary | ICD-10-CM | POA: Diagnosis not present

## 2022-05-29 DIAGNOSIS — J439 Emphysema, unspecified: Secondary | ICD-10-CM | POA: Diagnosis not present

## 2022-05-29 NOTE — Patient Instructions (Addendum)
[facility]  Discharge Instructions  You were seen and examined today by Dr. Ellin Saba.  Dr. Ellin Saba discussed your most recent lab work which revealed that everything looks stable. Your CT scan is showing some new nodules that Dr. Ellin Saba is going to follow up with a repeat CT scan in 3 months to see what is going on with them.  Follow-up as scheduled in 3 months.    Thank you for choosing  [facility] to provide your oncology and hematology care.   To afford each patient quality time with our provider, please arrive at least 15 minutes before your scheduled appointment time. You may need to reschedule your appointment if you arrive late (10 or more minutes). Arriving late affects you and other patients whose appointments are after yours.  Also, if you miss three or more appointments without notifying the office, you may be dismissed from the clinic at the provider's discretion.    Again, thank you for choosing [facility].  Our hope is that these requests will decrease the amount of time that you wait before being seen by our physicians.   If you have a lab appointment with the Cancer Center - please note that after April 8th, all labs will be drawn in the cancer center.  You do not have to check in or register with the main entrance as you have in the past but will complete your check-in at the cancer center.            _____________________________________________________________  Should you have questions after your visit to [facility], please contact our office at [phone] and follow the prompts.  Our office hours are 8:00 a.m. to 4:30 p.m. Monday - Thursday and 8:00 a.m. to 2:30 p.m. Friday.  Please note that voicemails left after 4:00 p.m. may not be returned until the following business day.  We are closed weekends and all major holidays.  You do have access to a nurse 24-7, just call the main  number to the clinic [phone] and do not press any options, hold on the line and a nurse will answer the phone.    For prescription refill requests, have your pharmacy contact our office and allow 72 hours.    Masks are no longer required in the cancer centers. If you would like for your care team to wear a mask while they are taking care of you, please let them know. You may have one support person who is at least 68 years old accompany you for your appointments.

## 2022-05-29 NOTE — Progress Notes (Signed)
[facility] [address], [place] [address]    Clinic Day:  05/29/2022  Referring physician: Sonny Masters, FNP  Patient Care Team: Sonny Masters, FNP as PCP - General (Family Medicine) Lanelle Bal, DO as Consulting Physician (Gastroenterology) Doreatha Massed, MD as Medical Oncologist (Medical Oncology) Therese Sarah, RN as Oncology Nurse Navigator (Medical Oncology)   ASSESSMENT & PLAN:   Assessment: 1. 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  No enlarged lymph nodes. - MRI of the chest with and without contrast on 03/06/2021: Heterogeneous, lobulated solid mass in the anterior mediastinum with faint contrast-enhancement measuring 4.4 x 2.8 cm.  Small areas of cystic change internally.  No enlarged mediastinal, hilar or axillary lymph nodes. - PET scan on 03/07/2021: Macrolobulated solid anterior mediastinal mass is hypermetabolic with SUV 9.4.  Thyroid is diffusely hypermetabolic without dominant mass with SUV 9.4.  Hypermetabolism anterior to the larynx could be muscular or related to atypical location of the node in this region including 6 mm SUV 3.9. - He was evaluated by Dr. Cliffton Asters.  Tumor markers show normal LDH and beta-hCG.  AFP was minimally high at 6.3 (less than 6.1). - No B symptoms. - Robotic assisted thoracoscopic resection of anterior mediastinal mass on 06/17/2021. - Pathology: 5.6 cm type B1 thymoma.  Margins negative for tumor.  Carcinoma appears to involve pericardium. - He completed PORT.   2. Social/family history: - He is a retired Animator. - Current active smoker, smoked 1 pack/day for 50 years, now smoking 2 cigarettes/day since last 1 month. - Brother died of thymic carcinoma.  Another brother had melanoma.  Maternal aunt had liver cancer.  Maternal grandmother had breast cancer.   Maternal grandfather had pancreatic cancer.  Paternal grandfather had colon cancer.    Plan: 1. Stage II/ Masaoka stage III (T2Nx M0) thymoma: - He does not have any signs or symptoms of recurrence. - Denies any recent infections. - Reviewed CT chest (05/22/2022): At least 4 new small subcentimeter right upper and right lower lobe pulmonary nodules indeterminate.  No change in lymphadenopathy. - Labs: Normal LFTs and CBC.  Creatinine elevated at 1.87, slightly worse than before. - Recommend follow-up in 3 months with repeat CT of the chest without contrast.  Orders Placed This Encounter  Procedures   CT Chest Wo Contrast    Standing Status:   Future    Standing Expiration Date:   05/29/2023    Order Specific Question:   Preferred imaging location?    Answer:   [facility]    Order Specific Question:   Release to patient    Answer:   Immediate [1]      I,Katie Daubenspeck,acting as a scribe for Doreatha Massed, MD.,have documented all relevant documentation on the behalf of Doreatha Massed, MD,as directed by  Doreatha Massed, MD while in the presence of Doreatha Massed, MD.   I, Doreatha Massed MD, have reviewed the above documentation for accuracy and completeness, and I agree with the above.   Doreatha Massed, MD   4/25/20245:00 PM  CHIEF COMPLAINT:   Diagnosis: thymoma    Cancer Staging  Thymoma Staging form: Thymus, AJCC 8th Edition - Clinical stage from 07/10/2021: Stage II (cT2, cN0, cM0) - Unsigned - Pathologic stage from 07/17/2021: Stage Unknown (pT2, pNX, cM0) - Signed by Antony Blackbird, MD  on 07/17/2021    Prior Therapy: Robotic assisted thoracoscopic resection of anterior mediastinal mass   Current Therapy:  surveillance   HISTORY OF PRESENT ILLNESS:   Oncology History   No history exists.     INTERVAL HISTORY:   Antonio Scott is a 67 y.o. male presenting to clinic today for follow up of thymoma. He was last seen by me on  11/20/21.  Since his last visit, he underwent surveillance chest CT on 05/22/22 showing: at least 4 new indeterminate small right pulmonary nodules; post-treatment scarring in upper lobes.  Today, he states that he is doing well overall. His appetite level is at 80%. His energy level is at 40%.  PAST MEDICAL HISTORY:   Past Medical History: Past Medical History:  Diagnosis Date   Anxiety    History of external beam radiation therapy    Mediastinum- 08/01/21-09/11/21-Dr. Antony Blackbird   Hypothyroidism     Surgical History: Past Surgical History:  Procedure Laterality Date   INTERCOSTAL NERVE BLOCK Right 06/17/2021   Procedure: INTERCOSTAL NERVE BLOCK;  Surgeon: Corliss Skains, MD;  Location: [facility] OR;  Service: Thoracic;  Laterality: Right;   SKIN LESION EXCISION      Social History: Social History   Socioeconomic History   Marital status: Single    Spouse name: Not on file   Number of children: 0   Years of education: Not on file   Highest education level: Not on file  Occupational History   Occupation: retired  Tobacco Use   Smoking status: Every Day    Packs/day: 0.20    Years: 50.00    Additional pack years: 0.00    Total pack years: 10.00    Types: Cigarettes   Smokeless tobacco: Never   Tobacco comments:    Currently smoking 7-10 cig daily  Vaping Use   Vaping Use: Never used  Substance and Sexual Activity   Alcohol use: Not Currently   Drug use: Not Currently   Sexual activity: Not Currently  Other Topics Concern   Not on file  Social History Narrative   No children   Brother lives a mile away   Social Determinants of Health   Financial Resource Strain: Low Risk  (02/26/2022)   Overall Financial Resource Strain (CARDIA)    Difficulty of Paying Living Expenses: Not hard at all  Food Insecurity: No Food Insecurity (02/26/2022)   Hunger Vital Sign    Worried About Running Out of Food in the Last Year: Never true    Ran Out of Food in the Last Year:  Never true  Transportation Needs: No Transportation Needs (02/26/2022)   PRAPARE - Administrator, Civil Service (Medical): No    Lack of Transportation (Non-Medical): No  Physical Activity: Insufficiently Active (02/26/2022)   Exercise Vital Sign    Days of Exercise per Week: 3 days    Minutes of Exercise per Session: 30 min  Stress: No Stress Concern Present (02/26/2022)   [employer] of Occupational Health - Occupational Stress Questionnaire    Feeling of Stress : Not at all  Social Connections: Socially Isolated (02/26/2022)   Social Connection and Isolation Panel [NHANES]    Frequency of Communication with Friends and Family: Once a week    Frequency of Social Gatherings with Friends and Family: Once a week    Attends Religious Services: Never    Database administrator or Organizations: No    Attends Banker Meetings: Never    Marital  Status: Never married  Intimate Partner Violence: Not At Risk (02/26/2022)   Humiliation, Afraid, Rape, and Kick questionnaire    Fear of Current or Ex-Partner: No    Emotionally Abused: No    Physically Abused: No    Sexually Abused: No    Family History: Family History  Problem Relation Age of Onset   Stroke Mother    Diabetes Mother    Cancer Father    Cancer Brother    Liver cancer Maternal Aunt    Colon cancer Paternal Grandfather     Current Medications:  Current Outpatient Medications:    levothyroxine (SYNTHROID) 50 MCG tablet, Take 1 tablet (50 mcg total) by mouth daily., Disp: 90 tablet, Rfl: 3   thiamine (VITAMIN B1) 100 MG tablet, Take 100 mg by mouth daily., Disp: , Rfl:    Allergies: No Known Allergies  REVIEW OF SYSTEMS:   Review of Systems  Constitutional:  Negative for chills, fatigue and fever.  HENT:   Negative for lump/mass, mouth sores, nosebleeds, sore throat and trouble swallowing.   Eyes:  Negative for eye problems.  Respiratory:  Positive for cough and shortness of breath.    Cardiovascular:  Negative for chest pain, leg swelling and palpitations.  Gastrointestinal:  Negative for abdominal pain, constipation, diarrhea, nausea and vomiting.  Genitourinary:  Positive for frequency. Negative for bladder incontinence, difficulty urinating, dysuria, hematuria and nocturia.   Musculoskeletal:  Negative for arthralgias, back pain, flank pain, myalgias and neck pain.  Skin:  Negative for itching and rash.  Neurological:  Negative for dizziness, headaches and numbness.  Hematological:  Does not bruise/bleed easily.  Psychiatric/Behavioral:  Positive for sleep disturbance. Negative for depression and suicidal ideas. The patient is not nervous/anxious.   All other systems reviewed and are negative.    VITALS:   Blood pressure 106/77, pulse 78, temperature 98.4 F (36.9 C), resp. rate 17, height  (1.803 m), weight 159 lb (72.1 kg), SpO2 100 %.  Wt Readings from Last 3 Encounters:  05/29/22 159 lb (72.1 kg)  02/26/22 153 lb (69.4 kg)  11/20/21 153 lb 1.6 oz (69.4 kg)    Body mass index is 22.18 kg/m.  Performance status (ECOG): 1 - Symptomatic but completely ambulatory  PHYSICAL EXAM:   Physical Exam Vitals and nursing note reviewed. Exam conducted with a chaperone present.  Constitutional:      Appearance: Normal appearance.  Cardiovascular:     Rate and Rhythm: Normal rate and regular rhythm.     Pulses: Normal pulses.     Heart sounds: Normal heart sounds.  Pulmonary:     Effort: Pulmonary effort is normal.     Breath sounds: Normal breath sounds.  Abdominal:     Palpations: Abdomen is soft. There is no hepatomegaly, splenomegaly or mass.     Tenderness: There is no abdominal tenderness.  Musculoskeletal:     Right lower leg: No edema.     Left lower leg: No edema.  Lymphadenopathy:     Cervical: No cervical adenopathy.     Right cervical: No superficial, deep or posterior cervical adenopathy.    Left cervical: No superficial, deep or  posterior cervical adenopathy.     Upper Body:     Right upper body: No supraclavicular or axillary adenopathy.     Left upper body: No supraclavicular or axillary adenopathy.  Neurological:     General: No focal deficit present.     Mental Status: He is alert and oriented to person, place,  and time.  Psychiatric:        Mood and Affect: Mood normal.        Behavior: Behavior normal.     LABS:      Latest Ref Rng & Units 05/22/2022    1:45 PM 03/07/2022    2:58 PM 11/11/2021    3:35 PM  CBC  WBC 4.0 - 10.5 K/uL 5.6  6.2  5.3   Hemoglobin 13.0 - 17.0 g/dL 13.2  44.0  10.2   Hematocrit 39.0 - 52.0 % 47.5  46.6  46.3   Platelets 150 - 400 K/uL 158  173  209       Latest Ref Rng & Units 05/22/2022    1:45 PM 03/07/2022    2:58 PM 11/11/2021    3:35 PM  CMP  Glucose 70 - 99 mg/dL 77  90  725   BUN 8 - 23 mg/dL 13  15  9    Creatinine 0.61 - 1.24 mg/dL 3.66  4.40  3.47   Sodium 135 - 145 mmol/L 135  137  140   Potassium 3.5 - 5.1 mmol/L 4.3  4.7  4.4   Chloride 98 - 111 mmol/L 100  101  105   CO2 22 - 32 mmol/L 27  25  27    Calcium 8.9 - 10.3 mg/dL 9.3  9.6  9.7   Total Protein 6.5 - 8.1 g/dL 7.3  7.1  7.5   Total Bilirubin 0.3 - 1.2 mg/dL 0.4  0.4  0.9   Alkaline Phos 38 - 126 U/L 89  101  88   AST 15 - 41 U/L 19  20  21    ALT 0 - 44 U/L 19  16  17       No results found for: "CEA1", "CEA" / No results found for: "CEA1", "CEA" No results found for: "PSA1" No results found for: "QQV956" No results found for: "CAN125"  No results found for: "TOTALPROTELP", "ALBUMINELP", "A1GS", "A2GS", "BETS", "BETA2SER", "GAMS", "MSPIKE", "SPEI" Lab Results  Component Value Date   TIBC 357 09/08/2021   FERRITIN 22 (L) 09/08/2021   IRONPCTSAT 33 09/08/2021   Lab Results  Component Value Date   LDH 169 05/22/2022   LDH 180 11/11/2021   LDH 230 03/18/2021     STUDIES:   CT Chest Wo Contrast  Result Date: 05/26/2022 CLINICAL DATA:  Mediastinal mass, lung nodule.  History of thymoma.  EXAM: CT CHEST WITHOUT CONTRAST TECHNIQUE: Multidetector CT imaging of the chest was performed following the standard protocol without IV contrast. RADIATION DOSE REDUCTION: This exam was performed according to the departmental dose-optimization program which includes automated exposure control, adjustment of the mA and/or kV according to patient size and/or use of iterative reconstruction technique. COMPARISON:  11/06/2021. FINDINGS: Cardiovascular: Atherosclerotic calcification of the aorta, aortic valve and coronary arteries. Heart size normal. No pericardial effusion. Mediastinum/Nodes: No pathologically enlarged mediastinal or axillary lymph nodes. Hilar regions are difficult to definitively evaluate without IV contrast. Esophagus is grossly unremarkable. Lungs/Pleura: Biapical pleuroparenchymal scarring. Centrilobular and paraseptal emphysema. There are a few scattered new pulmonary nodules, measuring up to 4 mm in the medial right lower lobe (3 x 5 mm, 3/66) with others in the right upper lobe on images 45 and 49. Subpleural lymph nodes along the left major fissure, unchanged. No pleural fluid. Debris is seen in the airway. Other pulmonary nodules measuring up to 4 mm in the anterior right upper lobe (3/46), unchanged. Post treatment ground-glass, architectural distortion and minimal  consolidation in the juxta mediastinal upper lobes. New subpleural atelectasis in the right lower lobe (3/119). Upper Abdomen: Visualized portion of the liver is unremarkable. Calcification in the right adrenal gland. Low-attenuation thickening of the left adrenal gland, unchanged. No specific follow-up necessary. Visualized portions of the kidneys, spleen, pancreas, stomach and bowel are otherwise grossly unremarkable. No upper abdominal adenopathy. Musculoskeletal: Degenerative changes in the spine. No worrisome lytic or sclerotic lesions. IMPRESSION: 1. At least 4 new small right upper and right lower lobe pulmonary nodules,  indeterminate. Metastatic disease not excluded. Recommend short-term follow-up CT chest in 4-6 weeks. 2. Post treatment pleuroparenchymal scarring in the juxta mediastinal upper lobes. 3. Aortic atherosclerosis (ICD10-I70.0). Coronary artery calcification. 4.  Emphysema (ICD10-J43.9). Electronically Signed   By: Leanna Battles M.D.   On: 05/26/2022 09:20

## 2022-08-20 ENCOUNTER — Other Ambulatory Visit: Payer: Self-pay

## 2022-08-20 DIAGNOSIS — J9859 Other diseases of mediastinum, not elsewhere classified: Secondary | ICD-10-CM

## 2022-08-21 ENCOUNTER — Ambulatory Visit (HOSPITAL_COMMUNITY)
Admission: RE | Admit: 2022-08-21 | Discharge: 2022-08-21 | Disposition: A | Discharge status: Home / Self Care | Payer: Medicare HMO | Source: Ambulatory Visit | Attending: Hematology | Admitting: Hematology

## 2022-08-21 ENCOUNTER — Inpatient Hospital Stay: Payer: Medicare HMO | Attending: Hematology

## 2022-08-21 DIAGNOSIS — J9859 Other diseases of mediastinum, not elsewhere classified: Secondary | ICD-10-CM | POA: Insufficient documentation

## 2022-08-21 DIAGNOSIS — F17210 Nicotine dependence, cigarettes, uncomplicated: Secondary | ICD-10-CM | POA: Diagnosis not present

## 2022-08-21 DIAGNOSIS — Z808 Family history of malignant neoplasm of other organs or systems: Secondary | ICD-10-CM | POA: Diagnosis not present

## 2022-08-21 DIAGNOSIS — Z803 Family history of malignant neoplasm of breast: Secondary | ICD-10-CM | POA: Diagnosis not present

## 2022-08-21 DIAGNOSIS — Z923 Personal history of irradiation: Secondary | ICD-10-CM | POA: Diagnosis not present

## 2022-08-21 DIAGNOSIS — Z800 Family history of malignant neoplasm of digestive organs: Secondary | ICD-10-CM | POA: Diagnosis not present

## 2022-08-21 DIAGNOSIS — Z85238 Personal history of other malignant neoplasm of thymus: Secondary | ICD-10-CM | POA: Diagnosis not present

## 2022-08-21 DIAGNOSIS — R918 Other nonspecific abnormal finding of lung field: Secondary | ICD-10-CM | POA: Diagnosis not present

## 2022-08-21 DIAGNOSIS — D4989 Neoplasm of unspecified behavior of other specified sites: Secondary | ICD-10-CM | POA: Insufficient documentation

## 2022-08-21 DIAGNOSIS — J439 Emphysema, unspecified: Secondary | ICD-10-CM | POA: Diagnosis not present

## 2022-08-21 LAB — CBC WITH DIFFERENTIAL/PLATELET
Abs Immature Granulocytes: 0.01 10*3/uL (ref 0.00–0.07)
Basophils Absolute: 0.1 10*3/uL (ref 0.0–0.1)
Basophils Relative: 1 %
Eosinophils Absolute: 0.2 10*3/uL (ref 0.0–0.5)
Eosinophils Relative: 4 %
HCT: 45.7 % (ref 39.0–52.0)
Hemoglobin: 15.2 g/dL (ref 13.0–17.0)
Immature Granulocytes: 0 %
Lymphocytes Relative: 16 %
Lymphs Abs: 0.9 10*3/uL (ref 0.7–4.0)
MCH: 31.3 pg (ref 26.0–34.0)
MCHC: 33.3 g/dL (ref 30.0–36.0)
MCV: 94 fL (ref 80.0–100.0)
Monocytes Absolute: 0.4 10*3/uL (ref 0.1–1.0)
Monocytes Relative: 8 %
Neutro Abs: 4 10*3/uL (ref 1.7–7.7)
Neutrophils Relative %: 71 %
Platelets: 152 10*3/uL (ref 150–400)
RBC: 4.86 MIL/uL (ref 4.22–5.81)
RDW: 13.2 % (ref 11.5–15.5)
WBC: 5.6 10*3/uL (ref 4.0–10.5)
nRBC: 0 % (ref 0.0–0.2)

## 2022-08-21 LAB — COMPREHENSIVE METABOLIC PANEL
ALT: 16 U/L (ref 0–44)
AST: 18 U/L (ref 15–41)
Albumin: 3.9 g/dL (ref 3.5–5.0)
Alkaline Phosphatase: 82 U/L (ref 38–126)
Anion gap: 5 (ref 5–15)
BUN: 17 mg/dL (ref 8–23)
CO2: 27 mmol/L (ref 22–32)
Calcium: 9.1 mg/dL (ref 8.9–10.3)
Chloride: 103 mmol/L (ref 98–111)
Creatinine, Ser: 1.67 mg/dL — ABNORMAL HIGH (ref 0.61–1.24)
GFR, Estimated: 44 mL/min — ABNORMAL LOW (ref >60)
Glucose, Bld: 93 mg/dL (ref 70–99)
Potassium: 4.6 mmol/L (ref 3.5–5.1)
Sodium: 135 mmol/L (ref 135–145)
Total Bilirubin: 0.4 mg/dL (ref 0.3–1.2)
Total Protein: 7.2 g/dL (ref 6.5–8.1)

## 2022-08-21 LAB — LACTATE DEHYDROGENASE: LDH: 153 U/L (ref 98–192)

## 2022-08-27 NOTE — Progress Notes (Signed)
[facility] [address] [place] [address]    Clinic Day:  08/28/22   Referring physician: Sonny Masters, FNP  Patient Care Team: Sonny Masters, FNP as PCP - General (Family Medicine) Lanelle Bal, DO as Consulting Physician (Gastroenterology) Doreatha Massed, MD as Medical Oncologist (Medical Oncology) Therese Sarah, RN as Oncology Nurse Navigator (Medical Oncology)   ASSESSMENT & PLAN:   Assessment: 1. 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  No enlarged lymph nodes. - MRI of the chest with and without contrast on 03/06/2021: Heterogeneous, lobulated solid mass in the anterior mediastinum with faint contrast-enhancement measuring 4.4 x 2.8 cm.  Small areas of cystic change internally.  No enlarged mediastinal, hilar or axillary lymph nodes. - PET scan on 03/07/2021: Macrolobulated solid anterior mediastinal mass is hypermetabolic with SUV 9.4.  Thyroid is diffusely hypermetabolic without dominant mass with SUV 9.4.  Hypermetabolism anterior to the larynx could be muscular or related to atypical location of the node in this region including 6 mm SUV 3.9. - He was evaluated by Dr. Cliffton Asters.  Tumor markers show normal LDH and beta-hCG.  AFP was minimally high at 6.3 (less than 6.1). - No B symptoms. - Robotic assisted thoracoscopic resection of anterior mediastinal mass on 06/17/2021. - Pathology: 5.6 cm type B1 thymoma.  Margins negative for tumor.  Carcinoma appears to involve pericardium. - He completed PORT.   2. Social/family history: - He is a retired Animator. - Current active smoker, smoked 1 pack/day for 50 years, now smoking 2 cigarettes/day since last 1 month. - Brother died of thymic carcinoma.  Another brother had melanoma.  Maternal aunt had liver cancer.  Maternal grandmother had breast cancer.   Maternal grandfather had pancreatic cancer.  Paternal grandfather had colon cancer.    Plan: 1. Stage II/ Masaoka stage III (T2Nx M0) thymoma: - He does not report any signs or symptoms of recurrence. - Reviewed labs from 08/21/2022: Creatinine 1.67 and at baseline.  LFTs are normal.  CBC grossly normal. - CT chest (08/21/2022): Small solid lung nodules which were new on the prior exam are stable.  Large subpleural nodular opacity of the right lower lobe which was new on the prior scan decreased in size.  Stable postradiation changes.  No evidence of recurrence. - He has tiredness.  Previous ferritin was low.  Recommend a trial off iron tablet on Monday, Wednesday and Friday, 3 times weekly. - Recommend follow-up in 6 months with repeat CT scan of the chest without contrast and labs including ferritin and iron panel.  Orders Placed This Encounter  Procedures   CT Chest Wo Contrast    Standing Status:   Future    Standing Expiration Date:   08/28/2023    Order Specific Question:   Preferred imaging location?    Answer:   [facility]    Order Specific Question:   Release to patient    Answer:   Immediate [1]   CBC with Differential/Platelet    Standing Status:   Future    Standing Expiration Date:   08/28/2023    Order Specific Question:   Release to patient    Answer:   Immediate   Comprehensive metabolic panel    Standing Status:   Future    Standing Expiration Date:   08/28/2023  Order Specific Question:   Release to patient    Answer:   Immediate   Ferritin    Standing Status:   Future    Standing Expiration Date:   08/28/2023    Order Specific Question:   Release to patient    Answer:   Immediate   Iron and TIBC    Standing Status:   Future    Standing Expiration Date:   08/28/2023    Order Specific Question:   Release to patient    Answer:   Immediate      I,Helena R Teague,acting as a scribe for Doreatha Massed, MD.,have documented all relevant documentation on  the behalf of Doreatha Massed, MD,as directed by  Doreatha Massed, MD while in the presence of Doreatha Massed, MD.  I, Doreatha Massed MD, have reviewed the above documentation for accuracy and completeness, and I agree with the above.    Doreatha Massed, MD   7/25/20243:38 PM  CHIEF COMPLAINT:   Diagnosis: thymoma    Cancer Staging  Thymoma Staging form: Thymus, AJCC 8th Edition - Clinical stage from 07/10/2021: Stage II (cT2, cN0, cM0) - Unsigned - Pathologic stage from 07/17/2021: Stage Unknown (pT2, pNX, cM0) - Signed by Antony Blackbird, MD on 07/17/2021    Prior Therapy: Robotic assisted thoracoscopic resection of anterior mediastinal mass   Current Therapy:  surveillance   HISTORY OF PRESENT ILLNESS:   Oncology History   No history exists.     INTERVAL HISTORY:   Antonio Scott is a 67 y.o. male presenting to clinic today for follow up of thymoma. He was last seen by me on 05/29/22.  Since his last visit, he underwent a CT of the chest was done on 08/21/2022.  Today, he states that he is doing well overall. His appetite level is at 75%. His energy level is at 25%.  PAST MEDICAL HISTORY:   Past Medical History: Past Medical History:  Diagnosis Date   Anxiety    History of external beam radiation therapy    Mediastinum- 08/01/21-09/11/21-Dr. Antony Blackbird   Hypothyroidism     Surgical History: Past Surgical History:  Procedure Laterality Date   INTERCOSTAL NERVE BLOCK Right 06/17/2021   Procedure: INTERCOSTAL NERVE BLOCK;  Surgeon: Corliss Skains, MD;  Location: [facility] OR;  Service: Thoracic;  Laterality: Right;   SKIN LESION EXCISION      Social History: Social History   Socioeconomic History   Marital status: Single    Spouse name: Not on file   Number of children: 0   Years of education: Not on file   Highest education level: Not on file  Occupational History   Occupation: retired  Tobacco Use   Smoking status: Every Day    Current  packs/day: 0.20    Average packs/day: 0.2 packs/day for 50.0 years (10.0 ttl pk-yrs)    Types: Cigarettes   Smokeless tobacco: Never   Tobacco comments:    Currently smoking 7-10 cig daily  Vaping Use   Vaping status: Never Used  Substance and Sexual Activity   Alcohol use: Not Currently   Drug use: Not Currently   Sexual activity: Not Currently  Other Topics Concern   Not on file  Social History Narrative   No children   Brother lives a mile away   Social Determinants of Health   Financial Resource Strain: Low Risk  (02/26/2022)   Overall Financial Resource Strain (CARDIA)    Difficulty of Paying Living Expenses: Not hard at all  Food Insecurity:  No Food Insecurity (02/26/2022)   Hunger Vital Sign    Worried About Running Out of Food in the Last Year: Never true    Ran Out of Food in the Last Year: Never true  Transportation Needs: No Transportation Needs (02/26/2022)   PRAPARE - Administrator, Civil Service (Medical): No    Lack of Transportation (Non-Medical): No  Physical Activity: Insufficiently Active (02/26/2022)   Exercise Vital Sign    Days of Exercise per Week: 3 days    Minutes of Exercise per Session: 30 min  Stress: No Stress Concern Present (02/26/2022)   [employer] of Occupational Health - Occupational Stress Questionnaire    Feeling of Stress : Not at all  Social Connections: Socially Isolated (02/26/2022)   Social Connection and Isolation Panel [NHANES]    Frequency of Communication with Friends and Family: Once a week    Frequency of Social Gatherings with Friends and Family: Once a week    Attends Religious Services: Never    Database administrator or Organizations: No    Attends Banker Meetings: Never    Marital Status: Never married  Intimate Partner Violence: Not At Risk (02/26/2022)   Humiliation, Afraid, Rape, and Kick questionnaire    Fear of Current or Ex-Partner: No    Emotionally Abused: No    Physically Abused:  No    Sexually Abused: No    Family History: Family History  Problem Relation Age of Onset   Stroke Mother    Diabetes Mother    Cancer Father    Cancer Brother    Liver cancer Maternal Aunt    Colon cancer Paternal Grandfather     Current Medications:  Current Outpatient Medications:    levothyroxine (SYNTHROID) 50 MCG tablet, Take 1 tablet (50 mcg total) by mouth daily., Disp: 90 tablet, Rfl: 3   thiamine (VITAMIN B1) 100 MG tablet, Take 100 mg by mouth daily. (Patient not taking: Reported on 08/28/2022), Disp: , Rfl:    Allergies: No Known Allergies  REVIEW OF SYSTEMS:   Review of Systems  Constitutional:  Positive for fatigue. Negative for chills and fever.  HENT:   Negative for lump/mass, mouth sores, nosebleeds, sore throat and trouble swallowing.   Eyes:  Negative for eye problems.  Respiratory:  Positive for cough and shortness of breath.   Cardiovascular:  Negative for chest pain, leg swelling and palpitations.  Gastrointestinal:  Negative for abdominal pain, constipation, diarrhea, nausea and vomiting.  Genitourinary:  Negative for bladder incontinence, difficulty urinating, dysuria, frequency, hematuria and nocturia.   Musculoskeletal:  Negative for arthralgias, back pain, flank pain, myalgias and neck pain.  Skin:  Positive for itching. Negative for rash.  Neurological:  Negative for dizziness, headaches and numbness.  Hematological:  Does not bruise/bleed easily.  Psychiatric/Behavioral:  Negative for depression, sleep disturbance and suicidal ideas. The patient is not nervous/anxious.   All other systems reviewed and are negative.    VITALS:   There were no vitals taken for this visit.  Wt Readings from Last 3 Encounters:  05/29/22 159 lb (72.1 kg)  02/26/22 153 lb (69.4 kg)  11/20/21 153 lb 1.6 oz (69.4 kg)    There is no height or weight on file to calculate BMI.  Performance status (ECOG): 1 - Symptomatic but completely ambulatory  PHYSICAL EXAM:    Physical Exam Vitals and nursing note reviewed. Exam conducted with a chaperone present.  Constitutional:      Appearance: Normal appearance.  Cardiovascular:     Rate and Rhythm: Normal rate and regular rhythm.     Pulses: Normal pulses.     Heart sounds: Normal heart sounds.  Pulmonary:     Effort: Pulmonary effort is normal.     Breath sounds: Normal breath sounds.  Abdominal:     Palpations: Abdomen is soft. There is no hepatomegaly, splenomegaly or mass.     Tenderness: There is no abdominal tenderness.  Musculoskeletal:     Right lower leg: No edema.     Left lower leg: No edema.  Lymphadenopathy:     Cervical: No cervical adenopathy.     Right cervical: No superficial, deep or posterior cervical adenopathy.    Left cervical: No superficial, deep or posterior cervical adenopathy.     Upper Body:     Right upper body: No supraclavicular or axillary adenopathy.     Left upper body: No supraclavicular or axillary adenopathy.  Neurological:     General: No focal deficit present.     Mental Status: He is alert and oriented to person, place, and time.  Psychiatric:        Mood and Affect: Mood normal.        Behavior: Behavior normal.     LABS:      Latest Ref Rng & Units 08/21/2022    2:43 PM 05/22/2022    1:45 PM 03/07/2022    2:58 PM  CBC  WBC 4.0 - 10.5 K/uL 5.6  5.6  6.2   Hemoglobin 13.0 - 17.0 g/dL 16.1  09.6  04.5   Hematocrit 39.0 - 52.0 % 45.7  47.5  46.6   Platelets 150 - 400 K/uL 152  158  173       Latest Ref Rng & Units 08/21/2022    2:43 PM 05/22/2022    1:45 PM 03/07/2022    2:58 PM  CMP  Glucose 70 - 99 mg/dL 93  77  90   BUN 8 - 23 mg/dL 17  13  15    Creatinine 0.61 - 1.24 mg/dL 4.09  8.11  9.14   Sodium 135 - 145 mmol/L 135  135  137   Potassium 3.5 - 5.1 mmol/L 4.6  4.3  4.7   Chloride 98 - 111 mmol/L 103  100  101   CO2 22 - 32 mmol/L 27  27  25    Calcium 8.9 - 10.3 mg/dL 9.1  9.3  9.6   Total Protein 6.5 - 8.1 g/dL 7.2  7.3  7.1   Total  Bilirubin 0.3 - 1.2 mg/dL 0.4  0.4  0.4   Alkaline Phos 38 - 126 U/L 82  89  101   AST 15 - 41 U/L 18  19  20    ALT 0 - 44 U/L 16  19  16       No results found for: "CEA1", "CEA" / No results found for: "CEA1", "CEA" No results found for: "PSA1" No results found for: "NWG956" No results found for: "CAN125"  No results found for: "TOTALPROTELP", "ALBUMINELP", "A1GS", "A2GS", "BETS", "BETA2SER", "GAMS", "MSPIKE", "SPEI" Lab Results  Component Value Date   TIBC 357 09/08/2021   FERRITIN 22 (L) 09/08/2021   IRONPCTSAT 33 09/08/2021   Lab Results  Component Value Date   LDH 153 08/21/2022   LDH 169 05/22/2022   LDH 180 11/11/2021     STUDIES:   CT Chest Wo Contrast  Result Date: 08/28/2022 CLINICAL DATA:  History of thymoma; * Tracking Code:  BO * EXAM: CT CHEST WITHOUT CONTRAST TECHNIQUE: Multidetector CT imaging of the chest was performed following the standard protocol without IV contrast. RADIATION DOSE REDUCTION: This exam was performed according to the departmental dose-optimization program which includes automated exposure control, adjustment of the mA and/or kV according to patient size and/or use of iterative reconstruction technique. COMPARISON:  Chest CT dated May 22, 2022 FINDINGS: Cardiovascular: Normal heart size. No pericardial effusion. Normal caliber thoracic aorta with mild calcified plaque. Moderate coronary artery calcifications. Mediastinum/Nodes: Esophagus and thyroid are unremarkable. No enlarged lymph nodes seen in the chest. No new mediastinal mass or nodule. Lungs/Pleura: Central airways are patent. Mild paraseptal emphysema. Stable bilateral paramediastinal postradiation change. No suspicious pleural nodules. Small solid nodules which were new on most recent prior exam are stable. A subpleural opacity of the right lower lobe located on series 4, image 15 is decreased in size. Reference nodule of the right lower lobe measuring 5 mm on series 4, image 6, unchanged.  Reference solid nodule of the right lower lobe 5 mm on series 4, image 113, unchanged. Additional previously seen small solid pulmonary nodules are stable. No pleural effusion. Upper Abdomen: Right adrenal gland calcifications, unchanged when compared with the prior exam and likely sequela of prior hemorrhage. No acute abnormality. Musculoskeletal: No chest wall mass or suspicious bone lesions identified. IMPRESSION: 1. Small solid pulmonary nodules which were new on prior exam are stable. Consider additional follow-up in 6 months. 2. A larger subpleural nodular opacity of the right lower lobe which was new on prior is decreased in size, likely resolving infectious or inflammatory process. 3. Stable postradiation changes of the chest with no evidence of local recurrence. 4. Aortic Atherosclerosis (ICD10-I70.0) and Emphysema (ICD10-J43.9). Electronically Signed   By: Allegra Lai M.D.   On: 08/28/2022 15:24

## 2022-08-28 ENCOUNTER — Inpatient Hospital Stay (HOSPITAL_BASED_OUTPATIENT_CLINIC_OR_DEPARTMENT_OTHER): Payer: Medicare HMO | Admitting: Hematology

## 2022-08-28 DIAGNOSIS — Z800 Family history of malignant neoplasm of digestive organs: Secondary | ICD-10-CM | POA: Diagnosis not present

## 2022-08-28 DIAGNOSIS — Z808 Family history of malignant neoplasm of other organs or systems: Secondary | ICD-10-CM | POA: Diagnosis not present

## 2022-08-28 DIAGNOSIS — D4989 Neoplasm of unspecified behavior of other specified sites: Secondary | ICD-10-CM | POA: Diagnosis not present

## 2022-08-28 DIAGNOSIS — J9859 Other diseases of mediastinum, not elsewhere classified: Secondary | ICD-10-CM

## 2022-08-28 DIAGNOSIS — R918 Other nonspecific abnormal finding of lung field: Secondary | ICD-10-CM | POA: Diagnosis not present

## 2022-08-28 DIAGNOSIS — Z803 Family history of malignant neoplasm of breast: Secondary | ICD-10-CM | POA: Diagnosis not present

## 2022-08-28 DIAGNOSIS — Z85238 Personal history of other malignant neoplasm of thymus: Secondary | ICD-10-CM | POA: Diagnosis not present

## 2022-08-28 DIAGNOSIS — Z923 Personal history of irradiation: Secondary | ICD-10-CM | POA: Diagnosis not present

## 2022-08-28 DIAGNOSIS — F17210 Nicotine dependence, cigarettes, uncomplicated: Secondary | ICD-10-CM | POA: Diagnosis not present

## 2022-08-28 NOTE — Patient Instructions (Addendum)
[facility] - [facility]  Discharge Instructions  You were seen and examined today by Dr. Ellin Saba.  Dr. Ellin Saba discussed your most recent lab work and CT scan which revealed that everything looks good and stable.  Start taking over the counter iron one pill on Monday, Wednesday and Friday.  Dr. Ellin Saba will repeat CT and labs before your next appointment.  Follow-up as scheduled in 6 months.    Thank you for choosing [facility] to provide your oncology and hematology care.   To afford each patient quality time with our provider, please arrive at least 15 minutes before your scheduled appointment time. You may need to reschedule your appointment if you arrive late (10 or more minutes). Arriving late affects you and other patients whose appointments are after yours.  Also, if you miss three or more appointments without notifying the office, you may be dismissed from the clinic at the provider's discretion.    Again, thank you for choosing [facility].  Our hope is that these requests will decrease the amount of time that you wait before being seen by our physicians.   If you have a lab appointment with the Cancer Center - please note that after April 8th, all labs will be drawn in the cancer center.  You do not have to check in or register with the main entrance as you have in the past but will complete your check-in at the cancer center.            _____________________________________________________________  Should you have questions after your visit to [facility], please contact our office at [phone] and follow the prompts.  Our office hours are 8:00 a.m. to 4:30 p.m. Monday - Thursday and 8:00 a.m. to 2:30 p.m. Friday.  Please note that voicemails left after 4:00 p.m. may not be returned until the following business day.  We are closed weekends and all major holidays.  You do have access to a nurse  24-7, just call the main number to the clinic [phone] and do not press any options, hold on the line and a nurse will answer the phone.    For prescription refill requests, have your pharmacy contact our office and allow 72 hours.    Masks are no longer required in the cancer centers. If you would like for your care team to wear a mask while they are taking care of you, please let them know. You may have one support person who is at least 68 years old accompany you for your appointments.

## 2022-09-20 DIAGNOSIS — Z85238 Personal history of other malignant neoplasm of thymus: Secondary | ICD-10-CM | POA: Diagnosis not present

## 2022-09-20 DIAGNOSIS — Z833 Family history of diabetes mellitus: Secondary | ICD-10-CM | POA: Diagnosis not present

## 2022-09-20 DIAGNOSIS — Z008 Encounter for other general examination: Secondary | ICD-10-CM | POA: Diagnosis not present

## 2022-09-20 DIAGNOSIS — E785 Hyperlipidemia, unspecified: Secondary | ICD-10-CM | POA: Diagnosis not present

## 2022-09-20 DIAGNOSIS — I2510 Atherosclerotic heart disease of native coronary artery without angina pectoris: Secondary | ICD-10-CM | POA: Diagnosis not present

## 2022-09-20 DIAGNOSIS — Z8589 Personal history of malignant neoplasm of other organs and systems: Secondary | ICD-10-CM | POA: Diagnosis not present

## 2022-09-20 DIAGNOSIS — I700 Atherosclerosis of aorta: Secondary | ICD-10-CM | POA: Diagnosis not present

## 2022-09-20 DIAGNOSIS — E039 Hypothyroidism, unspecified: Secondary | ICD-10-CM | POA: Diagnosis not present

## 2022-09-20 DIAGNOSIS — F419 Anxiety disorder, unspecified: Secondary | ICD-10-CM | POA: Diagnosis not present

## 2022-09-20 DIAGNOSIS — Z809 Family history of malignant neoplasm, unspecified: Secondary | ICD-10-CM | POA: Diagnosis not present

## 2022-09-20 DIAGNOSIS — E519 Thiamine deficiency, unspecified: Secondary | ICD-10-CM | POA: Diagnosis not present

## 2022-09-20 DIAGNOSIS — J439 Emphysema, unspecified: Secondary | ICD-10-CM | POA: Diagnosis not present

## 2022-09-20 DIAGNOSIS — F17210 Nicotine dependence, cigarettes, uncomplicated: Secondary | ICD-10-CM | POA: Diagnosis not present

## 2023-02-24 ENCOUNTER — Inpatient Hospital Stay: Payer: Medicare HMO

## 2023-02-24 ENCOUNTER — Ambulatory Visit (HOSPITAL_COMMUNITY): Payer: Medicare HMO

## 2023-03-03 ENCOUNTER — Inpatient Hospital Stay: Payer: Medicare HMO | Admitting: Hematology

## 2023-04-13 ENCOUNTER — Ambulatory Visit (HOSPITAL_COMMUNITY)
Admission: RE | Admit: 2023-04-13 | Discharge: 2023-04-13 | Disposition: A | Discharge status: Home / Self Care | Payer: Medicare HMO | Source: Ambulatory Visit | Attending: Hematology | Admitting: Hematology

## 2023-04-13 ENCOUNTER — Inpatient Hospital Stay: Payer: Medicare HMO | Attending: Hematology

## 2023-04-13 DIAGNOSIS — D4989 Neoplasm of unspecified behavior of other specified sites: Secondary | ICD-10-CM | POA: Insufficient documentation

## 2023-04-13 DIAGNOSIS — R918 Other nonspecific abnormal finding of lung field: Secondary | ICD-10-CM | POA: Insufficient documentation

## 2023-04-13 DIAGNOSIS — J9859 Other diseases of mediastinum, not elsewhere classified: Secondary | ICD-10-CM | POA: Insufficient documentation

## 2023-04-13 DIAGNOSIS — I700 Atherosclerosis of aorta: Secondary | ICD-10-CM | POA: Insufficient documentation

## 2023-04-13 DIAGNOSIS — Z923 Personal history of irradiation: Secondary | ICD-10-CM | POA: Insufficient documentation

## 2023-04-13 DIAGNOSIS — J439 Emphysema, unspecified: Secondary | ICD-10-CM | POA: Diagnosis not present

## 2023-04-13 LAB — COMPREHENSIVE METABOLIC PANEL
ALT: 21 U/L (ref 0–44)
AST: 22 U/L (ref 15–41)
Albumin: 4.1 g/dL (ref 3.5–5.0)
Alkaline Phosphatase: 86 U/L (ref 38–126)
Anion gap: 8 (ref 5–15)
BUN: 17 mg/dL (ref 8–23)
CO2: 28 mmol/L (ref 22–32)
Calcium: 9.7 mg/dL (ref 8.9–10.3)
Chloride: 103 mmol/L (ref 98–111)
Creatinine, Ser: 1.35 mg/dL — ABNORMAL HIGH (ref 0.61–1.24)
GFR, Estimated: 57 mL/min — ABNORMAL LOW (ref >60)
Glucose, Bld: 118 mg/dL — ABNORMAL HIGH (ref 70–99)
Potassium: 3.8 mmol/L (ref 3.5–5.1)
Sodium: 139 mmol/L (ref 135–145)
Total Bilirubin: 0.5 mg/dL (ref 0.0–1.2)
Total Protein: 7.4 g/dL (ref 6.5–8.1)

## 2023-04-13 LAB — CBC WITH DIFFERENTIAL/PLATELET
Abs Immature Granulocytes: 0.02 10*3/uL (ref 0.00–0.07)
Basophils Absolute: 0.1 10*3/uL (ref 0.0–0.1)
Basophils Relative: 1 %
Eosinophils Absolute: 0.2 10*3/uL (ref 0.0–0.5)
Eosinophils Relative: 3 %
HCT: 45.3 % (ref 39.0–52.0)
Hemoglobin: 14.9 g/dL (ref 13.0–17.0)
Immature Granulocytes: 0 %
Lymphocytes Relative: 12 %
Lymphs Abs: 0.7 10*3/uL (ref 0.7–4.0)
MCH: 31.4 pg (ref 26.0–34.0)
MCHC: 32.9 g/dL (ref 30.0–36.0)
MCV: 95.6 fL (ref 80.0–100.0)
Monocytes Absolute: 0.5 10*3/uL (ref 0.1–1.0)
Monocytes Relative: 9 %
Neutro Abs: 4.5 10*3/uL (ref 1.7–7.7)
Neutrophils Relative %: 75 %
Platelets: 158 10*3/uL (ref 150–400)
RBC: 4.74 MIL/uL (ref 4.22–5.81)
RDW: 13.1 % (ref 11.5–15.5)
WBC: 6 10*3/uL (ref 4.0–10.5)
nRBC: 0 % (ref 0.0–0.2)

## 2023-04-13 LAB — IRON AND TIBC
Iron: 88 ug/dL (ref 45–182)
Saturation Ratios: 26 % (ref 17.9–39.5)
TIBC: 341 ug/dL (ref 250–450)
UIBC: 253 ug/dL

## 2023-04-13 LAB — FERRITIN: Ferritin: 89 ng/mL (ref 24–336)

## 2023-04-20 ENCOUNTER — Inpatient Hospital Stay: Payer: Medicare HMO | Admitting: Hematology

## 2023-05-12 ENCOUNTER — Inpatient Hospital Stay: Attending: Hematology | Admitting: Hematology

## 2023-05-12 VITALS — BP 127/84 | HR 74 | Temp 96.9°F | Resp 18 | Ht 70.28 in | Wt 150.6 lb

## 2023-05-12 DIAGNOSIS — Z923 Personal history of irradiation: Secondary | ICD-10-CM | POA: Diagnosis not present

## 2023-05-12 DIAGNOSIS — F17210 Nicotine dependence, cigarettes, uncomplicated: Secondary | ICD-10-CM | POA: Diagnosis not present

## 2023-05-12 DIAGNOSIS — D4989 Neoplasm of unspecified behavior of other specified sites: Secondary | ICD-10-CM

## 2023-05-12 DIAGNOSIS — D508 Other iron deficiency anemias: Secondary | ICD-10-CM

## 2023-05-12 DIAGNOSIS — Z803 Family history of malignant neoplasm of breast: Secondary | ICD-10-CM | POA: Diagnosis not present

## 2023-05-12 DIAGNOSIS — Z808 Family history of malignant neoplasm of other organs or systems: Secondary | ICD-10-CM | POA: Diagnosis not present

## 2023-05-12 DIAGNOSIS — Z800 Family history of malignant neoplasm of digestive organs: Secondary | ICD-10-CM | POA: Diagnosis not present

## 2023-05-12 DIAGNOSIS — Z85238 Personal history of other malignant neoplasm of thymus: Secondary | ICD-10-CM | POA: Diagnosis not present

## 2023-05-12 NOTE — Patient Instructions (Signed)
 [facility] at [facility] Discharge Instructions   You were seen and examined today by Dr. Ellin Saba.  He reviewed the results of your lab work which are normal/stable.   He reviewed the results of your CT scan which did not show any evidence of cancer.   We will see you back in 6 months. We will repeat lab work and a CT scan prior to this visit.    Return as scheduled.    Thank you for choosing [facility] at [facility] to provide your oncology and hematology care.  To afford each patient quality time with our provider, please arrive at least 15 minutes before your scheduled appointment time.   If you have a lab appointment with the Cancer Center please come in thru the Main Entrance and check in at the main information desk.  You need to re-schedule your appointment should you arrive 10 or more minutes late.  We strive to give you quality time with our providers, and arriving late affects you and other patients whose appointments are after yours.  Also, if you no show three or more times for appointments you may be dismissed from the clinic at the providers discretion.     Again, thank you for choosing [facility].  Our hope is that these requests will decrease the amount of time that you wait before being seen by our physicians.       _____________________________________________________________  Should you have questions after your visit to [facility], please contact our office at [phone] and follow the prompts.  Our office hours are 8:00 a.m. and 4:30 p.m. Monday - Friday.  Please note that voicemails left after 4:00 p.m. may not be returned until the following business day.  We are closed weekends and major holidays.  You do have access to a nurse 24-7, just call the main number to the clinic [phone] and do not press any options, hold on the line and a nurse will answer the phone.    For prescription  refill requests, have your pharmacy contact our office and allow 72 hours.    Due to Covid, you will need to wear a mask upon entering the hospital. If you do not have a mask, a mask will be given to you at the Main Entrance upon arrival. For doctor visits, patients may have 1 support person age 47 or older with them. For treatment visits, patients can not have anyone with them due to social distancing guidelines and our immunocompromised population.

## 2023-05-12 NOTE — Progress Notes (Signed)
 [facility] [address] [place] [address]    Clinic Day:  05/12/23   Referring physician: Sonny Masters, FNP  Patient Care Team: Antonio Masters, FNP as PCP - General (Family Medicine) Antonio Bal, DO as Consulting Physician (Gastroenterology) Antonio Massed, MD as Medical Oncologist (Medical Oncology) Antonio Sarah, RN as Oncology Nurse Navigator (Medical Oncology)   ASSESSMENT & PLAN:   Assessment: 1. 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  No enlarged lymph nodes. - MRI of the chest with and without contrast on 03/06/2021: Heterogeneous, lobulated solid mass in the anterior mediastinum with faint contrast-enhancement measuring 4.4 x 2.8 cm.  Small areas of cystic change internally.  No enlarged mediastinal, hilar or axillary lymph nodes. - PET scan on 03/07/2021: Macrolobulated solid anterior mediastinal mass is hypermetabolic with SUV 9.4.  Thyroid is diffusely hypermetabolic without dominant mass with SUV 9.4.  Hypermetabolism anterior to the larynx could be muscular or related to atypical location of the node in this region including 6 mm SUV 3.9. - He was evaluated by Antonio Scott.  Tumor markers show normal LDH and beta-hCG.  AFP was minimally high at 6.3 (less than 6.1). - No B symptoms. - Robotic assisted thoracoscopic resection of anterior mediastinal mass on 06/17/2021. - Pathology: 5.6 cm type B1 thymoma.  Margins negative for tumor.  Carcinoma appears to involve pericardium. - He completed PORT.   2. Social/family history: - He is a retired Animator. - Current active smoker, smoked 1 pack/day for 50 years, now smoking 2 cigarettes/day since last 1 month. - Brother died of thymic carcinoma.  Another brother had melanoma.  Maternal aunt had liver cancer.  Maternal grandmother had breast cancer.   Maternal grandfather had pancreatic cancer.  Paternal grandfather had colon cancer.    Plan: 1. Stage II/ Masaoka stage III (T2Nx M0) thymoma: - He does not report any recent respiratory infections. - Labs from 04/13/2023: Creatinine 1.35 and stable.  LFTs are normal.  CBC was normal.  Ferritin has improved to 89.  He is not on iron supplements.  He takes multivitamin daily. - CT chest (04/13/2023): Stable postradiation changes with no evidence of recurrence.  Small lung nodules bilaterally are stable. - RTC 6 months for follow-up with repeat CT chest without contrast.  Orders Placed This Encounter  Procedures   CT CHEST WO CONTRAST    Standing Status:   Future    Expected Date:   11/11/2023    Expiration Date:   05/11/2024    Preferred imaging location?:   [facility]   CBC with Differential    Standing Status:   Future    Expected Date:   11/09/2023    Expiration Date:   05/11/2024   Comprehensive metabolic panel    Standing Status:   Future    Expected Date:   11/09/2023    Expiration Date:   05/11/2024   Iron and TIBC ([facility] ONLY)    Standing Status:   Future    Expected Date:   11/09/2023    Expiration Date:   05/11/2024   Ferritin    Standing Status:   Future    Expected Date:   11/09/2023    Expiration Date:   05/11/2024      Antonio Scott,acting as a scribe for Antonio Massed, MD.,have documented  all relevant documentation on the behalf of Antonio Massed, MD,as directed by  Antonio Massed, MD while in the presence of Antonio Massed, MD.  I, Antonio Massed MD, have reviewed the above documentation for accuracy and completeness, and I agree with the above.     Antonio Massed, MD   4/8/202512:55 PM  CHIEF COMPLAINT:   Diagnosis: thymoma    Cancer Staging  Thymoma Staging form: Thymus, AJCC 8th Edition - Clinical stage from 07/10/2021: Stage II (cT2, cN0, cM0) - Unsigned - Pathologic stage from 07/17/2021: Stage  Unknown (pT2, pNX, cM0) - Signed by Antonio Blackbird, MD on 07/17/2021    Prior Therapy: Robotic assisted thoracoscopic resection of anterior mediastinal mass   Current Therapy:  surveillance   HISTORY OF PRESENT ILLNESS:   Oncology History   No history exists.     INTERVAL HISTORY:   Antonio Scott is a 68 y.o. male presenting to clinic today for follow up of thymoma. He was last seen by me on 08/28/22.  Since his last visit, he underwent CT chest on 04/13/23 that found: Stable post radiation changes in the anterior mediastinum. No evidence of residual or recurrent tumor. No evidence of metastatic disease. Stable small pulmonary nodules bilaterally, likely benign based on stability. Aortic atherosclerosis and Emphysema.   Today, he states that he is doing well overall. His appetite level is at 70%. His energy level is at 70%. He denies any recent respiratory infections. His breathing is at baseline. [name] is taking a multivitamin daily. He is not taking oral iron supplements.   PAST MEDICAL HISTORY:   Past Medical History: Past Medical History:  Diagnosis Date   Anxiety    History of external beam radiation therapy    Mediastinum- 08/01/21-09/11/21-Dr. Antony Scott   Hypothyroidism     Surgical History: Past Surgical History:  Procedure Laterality Date   INTERCOSTAL NERVE BLOCK Right 06/17/2021   Procedure: INTERCOSTAL NERVE BLOCK;  Surgeon: Antonio Skains, MD;  Location: [facility] OR;  Service: Thoracic;  Laterality: Right;   SKIN LESION EXCISION      Social History: Social History   Socioeconomic History   Marital status: Single    Spouse name: Not on file   Number of children: 0   Years of education: Not on file   Highest education level: Not on file  Occupational History   Occupation: retired  Tobacco Use   Smoking status: Every Day    Current packs/day: 0.20    Average packs/day: 0.2 packs/day for 50.0 years (10.0 ttl pk-yrs)    Types: Cigarettes   Smokeless tobacco:  Never   Tobacco comments:    Currently smoking 7-10 cig daily  Vaping Use   Vaping status: Never Used  Substance and Sexual Activity   Alcohol use: Not Currently   Drug use: Not Currently   Sexual activity: Not Currently  Other Topics Concern   Not on file  Social History Narrative   No children   Brother lives a mile away   Social Drivers of Health   Financial Resource Strain: Low Risk  (02/26/2022)   Overall Financial Resource Strain (CARDIA)    Difficulty of Paying Living Expenses: Not hard at all  Food Insecurity: No Food Insecurity (02/26/2022)   Hunger Vital Sign    Worried About Running Out of Food in the Last Year: Never true    Ran Out of Food in the Last Year: Never true  Transportation Needs: No Transportation Needs (02/26/2022)   PRAPARE - Transportation  Lack of Transportation (Medical): No    Lack of Transportation (Non-Medical): No  Physical Activity: Insufficiently Active (02/26/2022)   Exercise Vital Sign    Days of Exercise per Week: 3 days    Minutes of Exercise per Session: 30 min  Stress: No Stress Concern Present (02/26/2022)   [employer] of Occupational Health - Occupational Stress Questionnaire    Feeling of Stress : Not at all  Social Connections: Socially Isolated (02/26/2022)   Social Connection and Isolation Panel [NHANES]    Frequency of Communication with Friends and Family: Once a week    Frequency of Social Gatherings with Friends and Family: Once a week    Attends Religious Services: Never    Database administrator or Organizations: No    Attends Banker Meetings: Never    Marital Status: Never married  Intimate Partner Violence: Not At Risk (02/26/2022)   Humiliation, Afraid, Rape, and Kick questionnaire    Fear of Current or Ex-Partner: No    Emotionally Abused: No    Physically Abused: No    Sexually Abused: No    Family History: Family History  Problem Relation Age of Onset   Stroke Mother    Diabetes Mother     Cancer Father    Cancer Brother    Liver cancer Maternal Aunt    Colon cancer Paternal Grandfather     Current Medications:  Current Outpatient Medications:    levothyroxine (SYNTHROID) 50 MCG tablet, Take 1 tablet (50 mcg total) by mouth daily., Disp: 90 tablet, Rfl: 3   thiamine (VITAMIN B1) 100 MG tablet, Take 100 mg by mouth daily., Disp: , Rfl:    Allergies: No Known Allergies  REVIEW OF SYSTEMS:   Review of Systems  Constitutional:  Negative for chills, fatigue and fever.  HENT:   Negative for lump/mass, mouth sores, nosebleeds, sore throat and trouble swallowing.        +trouble chewing  Eyes:  Negative for eye problems.  Respiratory:  Positive for shortness of breath (occasional). Negative for cough.   Cardiovascular:  Negative for chest pain, leg swelling and palpitations.  Gastrointestinal:  Positive for nausea (occasional). Negative for abdominal pain, constipation, diarrhea and vomiting.  Genitourinary:  Negative for bladder incontinence, difficulty urinating, dysuria, frequency, hematuria and nocturia.   Musculoskeletal:  Positive for back pain (7/10 severity). Negative for arthralgias, flank pain, myalgias and neck pain.  Skin:  Negative for itching and rash.  Neurological:  Negative for dizziness, headaches and numbness.  Hematological:  Does not bruise/bleed easily.  Psychiatric/Behavioral:  Negative for depression, sleep disturbance and suicidal ideas. The patient is not nervous/anxious.   All other systems reviewed and are negative.    VITALS:   Blood pressure 127/84, pulse 74, temperature (!) 96.9 F (36.1 C), temperature source Tympanic, resp. rate 18, height 5' 10.28" (1.785 m), weight 150 lb 9.2 oz (68.3 kg), SpO2 99%.  Wt Readings from Last 3 Encounters:  05/12/23 150 lb 9.2 oz (68.3 kg)  05/29/22 159 lb (72.1 kg)  02/26/22 153 lb (69.4 kg)    Body mass index is 21.44 kg/m.  Performance status (ECOG): 1 - Symptomatic but completely  ambulatory  PHYSICAL EXAM:   Physical Exam Vitals and nursing note reviewed. Exam conducted with a chaperone present.  Constitutional:      Appearance: Normal appearance.  Cardiovascular:     Rate and Rhythm: Normal rate and regular rhythm.     Pulses: Normal pulses.     Heart  sounds: Normal heart sounds.  Pulmonary:     Effort: Pulmonary effort is normal.     Breath sounds: Normal breath sounds.  Abdominal:     Palpations: Abdomen is soft. There is no hepatomegaly, splenomegaly or mass.     Tenderness: There is no abdominal tenderness.  Musculoskeletal:     Right lower leg: No edema.     Left lower leg: No edema.  Lymphadenopathy:     Cervical: No cervical adenopathy.     Right cervical: No superficial, deep or posterior cervical adenopathy.    Left cervical: No superficial, deep or posterior cervical adenopathy.     Upper Body:     Right upper body: No supraclavicular or axillary adenopathy.     Left upper body: No supraclavicular or axillary adenopathy.  Neurological:     General: No focal deficit present.     Mental Status: He is alert and oriented to person, place, and time.  Psychiatric:        Mood and Affect: Mood normal.        Behavior: Behavior normal.     LABS:      Latest Ref Rng & Units 04/13/2023    2:42 PM 08/21/2022    2:43 PM 05/22/2022    1:45 PM  CBC  WBC 4.0 - 10.5 K/uL 6.0  5.6  5.6   Hemoglobin 13.0 - 17.0 g/dL 19.1  47.8  29.5   Hematocrit 39.0 - 52.0 % 45.3  45.7  47.5   Platelets 150 - 400 K/uL 158  152  158       Latest Ref Rng & Units 04/13/2023    2:42 PM 08/21/2022    2:43 PM 05/22/2022    1:45 PM  CMP  Glucose 70 - 99 mg/dL 621  93  77   BUN 8 - 23 mg/dL 17  17  13    Creatinine 0.61 - 1.24 mg/dL 3.08  6.57  8.46   Sodium 135 - 145 mmol/L 139  135  135   Potassium 3.5 - 5.1 mmol/L 3.8  4.6  4.3   Chloride 98 - 111 mmol/L 103  103  100   CO2 22 - 32 mmol/L 28  27  27    Calcium 8.9 - 10.3 mg/dL 9.7  9.1  9.3   Total Protein 6.5 -  8.1 g/dL 7.4  7.2  7.3   Total Bilirubin 0.0 - 1.2 mg/dL 0.5  0.4  0.4   Alkaline Phos 38 - 126 U/L 86  82  89   AST 15 - 41 U/L 22  18  19    ALT 0 - 44 U/L 21  16  19       No results found for: "CEA1", "CEA" / No results found for: "CEA1", "CEA" No results found for: "PSA1" No results found for: "NGE952" No results found for: "CAN125"  No results found for: "TOTALPROTELP", "ALBUMINELP", "A1GS", "A2GS", "BETS", "BETA2SER", "GAMS", "MSPIKE", "SPEI" Lab Results  Component Value Date   TIBC 341 04/13/2023   TIBC 357 09/08/2021   FERRITIN 89 04/13/2023   FERRITIN 22 (L) 09/08/2021   IRONPCTSAT 26 04/13/2023   IRONPCTSAT 33 09/08/2021   Lab Results  Component Value Date   LDH 153 08/21/2022   LDH 169 05/22/2022   LDH 180 11/11/2021     STUDIES:   CT Chest Wo Contrast Result Date: 04/18/2023 CLINICAL DATA:  History of mediastinal mass/thymoma post radiation therapy. * Tracking Code: BO * EXAM: CT CHEST WITHOUT CONTRAST  TECHNIQUE: Multidetector CT imaging of the chest was performed following the standard protocol without IV contrast. RADIATION DOSE REDUCTION: This exam was performed according to the departmental dose-optimization program which includes automated exposure control, adjustment of the mA and/or kV according to patient size and/or use of iterative reconstruction technique. COMPARISON:  Chest CT 08/21/2022 and 05/22/2022.  PET-CT 03/07/2021 FINDINGS: Cardiovascular: Atherosclerosis of the aorta, great vessels and coronary arteries again noted. The heart size is normal. There is no pericardial effusion. Mediastinum/Nodes: No residual recurrent anterior mediastinal mass identified. There are no enlarged mediastinal, hilar or axillary lymph nodes. Hilar assessment is limited by the lack of intravenous contrast, although the hilar contours appear unchanged. The thyroid gland, trachea and esophagus demonstrate no significant findings. Lungs/Pleura: No pleural effusion or pneumothorax.  Stable paramediastinal radiation changes and mild paraseptal emphysema. Scattered small pulmonary nodules bilaterally are unchanged. No new, enlarging or otherwise suspicious nodules are identified. Upper abdomen: No acute findings are seen within the visualized upper abdomen. There are stable linear calcifications within the right adrenal gland, likely secondary to remote hemorrhage or infection. Stable low-density prominence of the left adrenal gland consistent with hyperplasia or an adenoma; no specific follow-up imaging recommended. Musculoskeletal/Chest wall: There is no chest wall mass or suspicious osseous finding. IMPRESSION: 1. Stable post radiation changes in the anterior mediastinum. No evidence of residual or recurrent tumor. 2. No evidence of metastatic disease. 3. Stable small pulmonary nodules bilaterally, likely benign based on stability. 4. Aortic Atherosclerosis (ICD10-I70.0) and Emphysema (ICD10-J43.9). Electronically Signed   By: Carey Bullocks M.D.   On: 04/18/2023 12:21

## 2023-05-19 ENCOUNTER — Ambulatory Visit (INDEPENDENT_AMBULATORY_CARE_PROVIDER_SITE_OTHER): Payer: Medicare HMO

## 2023-05-19 VITALS — BP 127/84 | Ht 70.0 in | Wt 155.0 lb

## 2023-05-19 DIAGNOSIS — Z2821 Immunization not carried out because of patient refusal: Secondary | ICD-10-CM

## 2023-05-19 DIAGNOSIS — Z0000 Encounter for general adult medical examination without abnormal findings: Secondary | ICD-10-CM

## 2023-05-19 NOTE — Progress Notes (Signed)
 Because this visit was a virtual/telehealth visit,  certain criteria was not obtained, such a blood pressure, CBG if applicable, and timed get up and go. Any medications not marked as "taking" were not mentioned during the medication reconciliation part of the visit. Any vitals not documented were not able to be obtained due to this being a telehealth visit or patient was unable to self-report a recent blood pressure reading due to a lack of equipment at home via telehealth. Vitals that have been documented are verbally provided by the patient.  Subjective:   Antonio Scott is a 68 y.o. who presents for a Medicare Wellness preventive visit.  Visit Complete: Virtual I connected with  Zay H Brazil on 05/19/23 by a audio enabled telemedicine application and verified that I am speaking with the correct person using two identifiers.  Patient Location: Home  Provider Location: Home Office  I discussed the limitations of evaluation and management by telemedicine. The patient expressed understanding and agreed to proceed.  Vital Signs: Because this visit was a virtual/telehealth visit, some criteria may be missing or patient reported. Any vitals not documented were not able to be obtained and vitals that have been documented are patient reported.  VideoDeclined- This patient declined Librarian, academic. Therefore the visit was completed with audio only.  Persons Participating in Visit: Patient.  AWV Questionnaire: Yes: Patient Medicare AWV questionnaire was completed by the patient on 05/12/2023; I have confirmed that all information answered by patient is correct and no changes since this date.  Cardiac Risk Factors include: advanced age (>17men, >94 women);male gender;dyslipidemia     Objective:    Today's Vitals   05/19/23 1602 05/19/23 1604  BP: 127/84   Weight: 155 lb (70.3 kg)   Height: 5\' 10"  (1.778 m)   PainSc:  0-No pain   Body mass index is 22.24  kg/m.     05/12/2023   12:02 PM 08/28/2022    3:17 PM 05/29/2022    3:31 PM 02/26/2022    2:26 PM 11/20/2021    3:45 PM 09/07/2021    2:08 PM 07/17/2021    9:56 AM  Advanced Directives  Does Patient Have a Medical Advance Directive? No No No No No No No  Would patient like information on creating a medical advance directive? No - Patient declined  No - Patient declined No - Patient declined No - Patient declined      Current Medications (verified) Outpatient Encounter Medications as of 05/19/2023  Medication Sig   levothyroxine (SYNTHROID) 50 MCG tablet Take 1 tablet (50 mcg total) by mouth daily.   thiamine (VITAMIN B1) 100 MG tablet Take 100 mg by mouth daily.   No facility-administered encounter medications on file as of 05/19/2023.    Allergies (verified) Patient has no known allergies.   History: Past Medical History:  Diagnosis Date   Anxiety    History of external beam radiation therapy    Mediastinum- 08/01/21-09/11/21-Dr. Antony Blackbird   Hypothyroidism    Past Surgical History:  Procedure Laterality Date   INTERCOSTAL NERVE BLOCK Right 06/17/2021   Procedure: INTERCOSTAL NERVE BLOCK;  Surgeon: Corliss Skains, MD;  Location: [facility] OR;  Service: Thoracic;  Laterality: Right;   SKIN LESION EXCISION     Family History  Problem Relation Age of Onset   Stroke Mother    Diabetes Mother    Cancer Father    Cancer Brother    Liver cancer Maternal Aunt    Colon cancer  Paternal Grandfather    Social History   Socioeconomic History   Marital status: Single    Spouse name: Not on file   Number of children: 0   Years of education: Not on file   Highest education level: Not on file  Occupational History   Occupation: retired  Tobacco Use   Smoking status: Every Day    Current packs/day: 0.20    Average packs/day: 0.2 packs/day for 50.0 years (10.0 ttl pk-yrs)    Types: Cigarettes   Smokeless tobacco: Never   Tobacco comments:    Currently smoking 7-10 cig daily   Vaping Use   Vaping status: Never Used  Substance and Sexual Activity   Alcohol use: Not Currently   Drug use: Not Currently   Sexual activity: Not Currently  Other Topics Concern   Not on file  Social History Narrative   No children   Brother lives a mile away   Social Drivers of Health   Financial Resource Strain: Low Risk  (05/19/2023)   Overall Financial Resource Strain (CARDIA)    Difficulty of Paying Living Expenses: Not hard at all  Food Insecurity: No Food Insecurity (05/19/2023)   Hunger Vital Sign    Worried About Running Out of Food in the Last Year: Never true    Ran Out of Food in the Last Year: Never true  Transportation Needs: No Transportation Needs (05/19/2023)   PRAPARE - Administrator, Civil Service (Medical): No    Lack of Transportation (Non-Medical): No  Physical Activity: Insufficiently Active (05/19/2023)   Exercise Vital Sign    Days of Exercise per Week: 3 days    Minutes of Exercise per Session: 30 min  Stress: No Stress Concern Present (05/19/2023)   [employer] of Occupational Health - Occupational Stress Questionnaire    Feeling of Stress : Not at all  Social Connections: Socially Isolated (05/19/2023)   Social Connection and Isolation Panel [NHANES]    Frequency of Communication with Friends and Family: Once a week    Frequency of Social Gatherings with Friends and Family: Once a week    Attends Religious Services: Never    Database administrator or Organizations: No    Attends Engineer, structural: Never    Marital Status: Never married    Tobacco Counseling Ready to quit: Not Answered Counseling given: Not Answered Tobacco comments: Currently smoking 7-10 cig daily    Clinical Intake:  Pre-visit preparation completed: Yes  Pain : No/denies pain Pain Score: 0-No pain     BMI - recorded: 22.24 Nutritional Status: BMI of 19-24  Normal Nutritional Risks: None Diabetes: No  Lab Results  Component Value  Date   HGBA1C 5.3 08/20/2021     How often do you need to have someone help you when you read instructions, pamphlets, or other written materials from your doctor or pharmacy?: 1 - Never     Information entered by :: [employer]   Activities of Daily Living     05/19/2023    4:07 PM  In your present state of health, do you have any difficulty performing the following activities:  Hearing? 0  Vision? 0  Difficulty concentrating or making decisions? 0  Walking or climbing stairs? 0  Dressing or bathing? 0  Doing errands, shopping? 0  Preparing Food and eating ? N  Using the Toilet? N  In the past six months, have you accidently leaked urine? N  Do you have problems with  loss of bowel control? N  Managing your Medications? N  Managing your Finances? N  Housekeeping or managing your Housekeeping? N    Patient Care Team: Sonny Masters, FNP as PCP - General (Family Medicine) Lanelle Bal, DO as Consulting Physician (Gastroenterology) Doreatha Massed, MD as Medical Oncologist (Medical Oncology) Therese Sarah, RN as Oncology Nurse Navigator (Medical Oncology)  Indicate any recent Medical Services you may have received from other than [facility] providers in the past year (date may be approximate).     Assessment:   This is a routine wellness examination for Brendyn.  Hearing/Vision screen Hearing Screening - Comments:: No issues hearing  Vision Screening - Comments:: No difficulties seeing    Goals Addressed             This Visit's Progress    Quit Smoking   On track      Depression Screen     05/19/2023    4:08 PM 02/26/2022    2:15 PM 08/20/2021    4:28 PM 02/25/2021    3:43 PM 02/19/2021    3:25 PM 11/16/2020    3:13 PM  PHQ 2/9 Scores  PHQ - 2 Score 1 0 1 0 0 0  PHQ- 9 Score 2  5 0 0 2    Fall Risk     05/19/2023    4:07 PM 02/26/2022   12:16 PM 08/20/2021    4:31 PM 02/25/2021    3:35 PM 02/19/2021    3:25 PM  Fall Risk   Falls in the  past year? 0 0 0 0 0  Number falls in past yr: 0 0  0   Injury with Fall? 0 0  0   Risk for fall due to : No Fall Risks   No Fall Risks   Follow up Falls prevention discussed;Falls evaluation completed Falls prevention discussed;Education provided;Falls evaluation completed  Falls prevention discussed     MEDICARE RISK AT HOME:  Medicare Risk at Home Any stairs in or around the home?: Yes If so, are there any without handrails?: No Home free of loose throw rugs in walkways, pet beds, electrical cords, etc?: Yes Adequate lighting in your home to reduce risk of falls?: Yes Life alert?: No Use of a cane, walker or w/c?: No Grab bars in the bathroom?: Yes Shower chair or bench in shower?: Yes Elevated toilet seat or a handicapped toilet?: Yes  TIMED UP AND GO:  Was the test performed?  No  Cognitive Function: 6CIT completed        05/19/2023    4:04 PM 02/26/2022    2:26 PM 02/25/2021    3:38 PM  6CIT Screen  What Year? 0 points 0 points 0 points  What month? 0 points 0 points 0 points  What time? 0 points 0 points 0 points  Count back from 20 0 points 0 points 0 points  Months in reverse 0 points 0 points 0 points  Repeat phrase 0 points 0 points 0 points  Total Score 0 points 0 points 0 points    Immunizations Immunization History  Administered Date(s) Administered   Fluad Quad(high Dose 65+) 11/15/2020, 10/27/2021   Moderna Covid-19 Vaccine Bivalent Booster 109yrs & up 11/16/2020   PFIZER(Purple Top)SARS-COV-2 Vaccination 05/19/2019, 06/10/2019, 01/13/2020   PNEUMOCOCCAL CONJUGATE-20 03/13/2021   RSV,unspecified 10/27/2021   Tdap 08/17/2018   Zoster Recombinant(Shingrix) 01/15/2021, 04/17/2021    Screening Tests Health Maintenance  Topic Date Due   Colonoscopy  Never done  COVID-19 Vaccine (5 - 2024-25 season) 10/05/2022   INFLUENZA VACCINE  09/04/2023   Medicare Annual Wellness (AWV)  05/18/2024   DTaP/Tdap/Td (2 - Td or Tdap) 08/16/2028   Pneumonia Vaccine  41+ Years old  Completed   Hepatitis C Screening  Completed   Zoster Vaccines- Shingrix  Completed   HPV VACCINES  Aged Out   Meningococcal B Vaccine  Aged Out    Health Maintenance  Health Maintenance Due  Topic Date Due   Colonoscopy  Never done   COVID-19 Vaccine (5 - 2024-25 season) 10/05/2022   Health Maintenance Items Addressed:  Additional Screening:  Vision Screening: Recommended annual ophthalmology exams for early detection of glaucoma and other disorders of the eye.  Dental Screening: Recommended annual dental exams for proper oral hygiene  Community Resource Referral / Chronic Care Management: CRR required this visit?  No   CCM required this visit?  No     Plan:     I have personally reviewed and noted the following in the patient's chart:   Medical and social history Use of alcohol, tobacco or illicit drugs  Current medications and supplements including opioid prescriptions. Patient is not currently taking opioid prescriptions. Functional ability and status Nutritional status Physical activity Advanced directives List of other physicians Hospitalizations, surgeries, and ER visits in previous 12 months Vitals Screenings to include cognitive, depression, and falls Referrals and appointments  In addition, I have reviewed and discussed with patient certain preventive protocols, quality metrics, and best practice recommendations. A written personalized care plan for preventive services as well as general preventive health recommendations were provided to patient.     Freeda Jerry, [place]   05/19/2023   After Visit Summary: (MyChart) Due to this being a telephonic visit, the after visit summary with patients personalized plan was offered to patient via MyChart   Notes: Nothing significant to report at this time.

## 2023-05-19 NOTE — Patient Instructions (Signed)
 Antonio Scott , Thank you for taking time to come for your Medicare Wellness Visit. I appreciate your ongoing commitment to your health goals. Please review the following plan we discussed and let me know if I can assist you in the future.   Referrals/Orders/Follow-Ups/Clinician Recommendations: Follow up in one year for Next year   This is a list of the screening recommended for you and due dates:  Health Maintenance  Topic Date Due   Colon Cancer Screening  Never done   COVID-19 Vaccine (5 - 2024-25 season) 10/05/2022   Flu Shot  09/04/2023   Medicare Annual Wellness Visit  05/18/2024   DTaP/Tdap/Td vaccine (2 - Td or Tdap) 08/16/2028   Pneumonia Vaccine  Completed   Hepatitis C Screening  Completed   Zoster (Shingles) Vaccine  Completed   HPV Vaccine  Aged Out   Meningitis B Vaccine  Aged Out    Advanced directives: (Declined) Advance directive discussed with you today. Even though you declined this today, please call our office should you change your mind, and we can give you the proper paperwork for you to fill out.  Next Medicare Annual Wellness Visit scheduled for next year: Yes

## 2023-06-01 ENCOUNTER — Telehealth: Payer: Self-pay

## 2023-06-01 NOTE — Telephone Encounter (Signed)
Left message for patient to call back to make appt 

## 2023-06-01 NOTE — Telephone Encounter (Signed)
 Copied from CRM [id number]. Topic: Clinical - Medication Refill >> Jun 01, 2023  9:12 AM Dorthula Gavel H wrote: Most Recent Primary Care Visit:   Medication: levothyroxine  (SYNTHROID ) 50 MCG tablet  Has the patient contacted their pharmacy? Yes (Agent: If no, request that the patient contact the pharmacy for the refill. If patient does not wish to contact the pharmacy document the reason why and proceed with request.) (Agent: If yes, when and what did the pharmacy advise?)  Is this the correct pharmacy for this prescription? Yes If no, delete pharmacy and type the correct one.  This is the patient's preferred pharmacy:  [facility] [address] [place] - [address] [place] [address] Phone: [phone] Fax: [phone]  [id number] - [place], [place] - [address] [place] [address] Phone: [phone] Fax: [phone]   Has the prescription been filled recently? Yes  Is the patient out of the medication? No  Has the patient been seen for an appointment in the last year OR does the patient have an upcoming appointment? Yes  Can we respond through MyChart? No  Agent: Please be advised that Rx refills may take up to 3 business days. We ask that you follow-up with your pharmacy.

## 2023-06-02 ENCOUNTER — Encounter: Payer: Self-pay | Admitting: Family Medicine

## 2023-06-02 ENCOUNTER — Ambulatory Visit (INDEPENDENT_AMBULATORY_CARE_PROVIDER_SITE_OTHER): Admitting: Family Medicine

## 2023-06-02 VITALS — BP 99/63 | HR 84 | Temp 97.3°F | Ht 70.0 in | Wt 142.4 lb

## 2023-06-02 DIAGNOSIS — R351 Nocturia: Secondary | ICD-10-CM | POA: Diagnosis not present

## 2023-06-02 DIAGNOSIS — Z9089 Acquired absence of other organs: Secondary | ICD-10-CM

## 2023-06-02 DIAGNOSIS — L602 Onychogryphosis: Secondary | ICD-10-CM

## 2023-06-02 DIAGNOSIS — N289 Disorder of kidney and ureter, unspecified: Secondary | ICD-10-CM

## 2023-06-02 DIAGNOSIS — E519 Thiamine deficiency, unspecified: Secondary | ICD-10-CM | POA: Diagnosis not present

## 2023-06-02 DIAGNOSIS — I700 Atherosclerosis of aorta: Secondary | ICD-10-CM | POA: Diagnosis not present

## 2023-06-02 DIAGNOSIS — R7309 Other abnormal glucose: Secondary | ICD-10-CM

## 2023-06-02 DIAGNOSIS — E039 Hypothyroidism, unspecified: Secondary | ICD-10-CM

## 2023-06-02 DIAGNOSIS — E782 Mixed hyperlipidemia: Secondary | ICD-10-CM

## 2023-06-02 DIAGNOSIS — F17210 Nicotine dependence, cigarettes, uncomplicated: Secondary | ICD-10-CM

## 2023-06-02 DIAGNOSIS — E538 Deficiency of other specified B group vitamins: Secondary | ICD-10-CM | POA: Diagnosis not present

## 2023-06-02 LAB — BAYER DCA HB A1C WAIVED: HB A1C (BAYER DCA - WAIVED): 4.8 % (ref 4.8–5.6)

## 2023-06-02 MED ORDER — ASPIRIN 81 MG PO TBEC: 81.0000 mg | DELAYED_RELEASE_TABLET | Freq: Every day | ORAL | Status: AC

## 2023-06-02 MED ORDER — LEVOTHYROXINE SODIUM 50 MCG PO TABS: 50.0000 ug | ORAL_TABLET | Freq: Every day | ORAL | 1 refills | Status: DC

## 2023-06-02 NOTE — Progress Notes (Signed)
 Subjective:  Patient ID: Antonio Scott, male    DOB: 12/05/1955, 67 y.o.   MRN: [id number]  Patient Care Team: Galvin Jules, FNP as PCP - General (Family Medicine) Vinetta Greening, DO as Consulting Physician (Gastroenterology) Paulett Boros, MD as Medical Oncologist (Medical Oncology) Gerhard Knuckles, RN as Oncology Nurse Navigator (Medical Oncology)   Chief Complaint:  Medical Management of Chronic Issues   HPI: Antonio Scott is a 68 y.o. male presenting on 06/02/2023 for Medical Management of Chronic Issues   History of Present Illness   Antonio Scott is a 68 year old male who presents for follow-up and medication review.  After completing radiation treatment, a CT scan last month showed no evidence of recurrent tumor, but some inflammation was noted. He has a history of aortic atherosclerosis and emphysema. He continues to smoke cigarettes, stating he smokes 'about a million' cigarettes, consistent with his long-standing habit since age 17.  He has a history of hypothyroidism and was previously on levothyroxine , which he stopped taking about two months ago. No increased fatigue or noticeable symptoms have been reported since stopping the medication. He also used to take thiamine  but has not been taking it recently.  His glucose levels were slightly elevated at the last check, but he does not recall if he had eaten prior to the test. He reports normal urination but notes his creatinine levels are still a bit off. He gets up once or twice at night to urinate and usually wakes up needing to urinate.  He has not had a recent colonoscopy but attempted to use a Cologuard kit sent by [facility], which did not yield results. He reports no symptoms suggestive of colon cancer.  He experiences some swelling at the top of his socks, which resolves after removing them. He has a toenail issue on his right foot, describing it as 'really sore' and needing trimming or possibly an  operation.  He occasionally experiences a gray spot in his left eye, which he can 'blink out'. He has not seen an eye doctor recently but had seen one in the past for pink eye and a foreign body in the eye.  He has a history of substance use, including smoking marijuana and using other drugs in the past, but he has not engaged in these activities recently. He mentions a history of alcohol use but does not currently drink.          Relevant past medical, surgical, family, and social history reviewed and updated as indicated.  Allergies and medications reviewed and updated. Data reviewed: Chart in Epic.   Past Medical History:  Diagnosis Date   Anxiety    History of external beam radiation therapy    Mediastinum- 08/01/21-09/11/21-Dr. Retta Caster   Hypothyroidism     Past Surgical History:  Procedure Laterality Date   INTERCOSTAL NERVE BLOCK Right 06/17/2021   Procedure: INTERCOSTAL NERVE BLOCK;  Surgeon: Hilarie Lovely, MD;  Location: [facility] OR;  Service: Thoracic;  Laterality: Right;   SKIN LESION EXCISION      Social History   Socioeconomic History   Marital status: Single    Spouse name: Not on file   Number of children: 0   Years of education: Not on file   Highest education level: Not on file  Occupational History   Occupation: retired  Tobacco Use   Smoking status: Every Day    Current packs/day: 1.00    Average packs/day: 0.6 packs/day  for 105.3 years (65.3 ttl pk-yrs)    Types: Cigarettes    Start date: 1970   Smokeless tobacco: Never  Vaping Use   Vaping status: Never Used  Substance and Sexual Activity   Alcohol use: Not Currently   Drug use: Not Currently   Sexual activity: Not Currently  Other Topics Concern   Not on file  Social History Narrative   No children   Brother lives a mile away   Social Drivers of Health   Financial Resource Strain: Low Risk  (05/19/2023)   Overall Financial Resource Strain (CARDIA)    Difficulty of Paying Living  Expenses: Not hard at all  Food Insecurity: No Food Insecurity (05/19/2023)   Hunger Vital Sign    Worried About Running Out of Food in the Last Year: Never true    Ran Out of Food in the Last Year: Never true  Transportation Needs: No Transportation Needs (05/19/2023)   PRAPARE - Administrator, Civil Service (Medical): No    Lack of Transportation (Non-Medical): No  Physical Activity: Insufficiently Active (05/19/2023)   Exercise Vital Sign    Days of Exercise per Week: 3 days    Minutes of Exercise per Session: 30 min  Stress: No Stress Concern Present (05/19/2023)   [employer] of Occupational Health - Occupational Stress Questionnaire    Feeling of Stress : Not at all  Social Connections: Socially Isolated (05/19/2023)   Social Connection and Isolation Panel [NHANES]    Frequency of Communication with Friends and Family: Once a week    Frequency of Social Gatherings with Friends and Family: Once a week    Attends Religious Services: Never    Database administrator or Organizations: No    Attends Banker Meetings: Never    Marital Status: Never married  Intimate Partner Violence: Not At Risk (05/19/2023)   Humiliation, Afraid, Rape, and Kick questionnaire    Fear of Current or Ex-Partner: No    Emotionally Abused: No    Physically Abused: No    Sexually Abused: No    Outpatient Encounter Medications as of 06/02/2023  Medication Sig   aspirin EC 81 MG tablet Take 1 tablet (81 mg total) by mouth daily. Swallow whole.   thiamine  (VITAMIN B1) 100 MG tablet Take 100 mg by mouth daily.   [DISCONTINUED] levothyroxine  (SYNTHROID ) 50 MCG tablet Take 1 tablet (50 mcg total) by mouth daily.   levothyroxine  (SYNTHROID ) 50 MCG tablet Take 1 tablet (50 mcg total) by mouth daily.   No facility-administered encounter medications on file as of 06/02/2023.    No Known Allergies  Pertinent ROS per HPI, otherwise unremarkable      Objective:  BP 99/63   Pulse  84   Temp (!) 97.3 F (36.3 C)   Ht 5\' 10"  (1.778 m)   Wt 142 lb 6.4 oz (64.6 kg)   SpO2 94%   BMI 20.43 kg/m    Wt Readings from Last 3 Encounters:  06/02/23 142 lb 6.4 oz (64.6 kg)  05/19/23 155 lb (70.3 kg)  05/12/23 150 lb 9.2 oz (68.3 kg)    Physical Exam Vitals and nursing note reviewed.  Constitutional:      General: He is not in acute distress.    Appearance: Normal appearance. He is well-developed, well-groomed and normal weight. He is not ill-appearing, toxic-appearing or diaphoretic.  HENT:     Head: Normocephalic and atraumatic.     Jaw: There is normal jaw occlusion.  Right Ear: Hearing, tympanic membrane, ear canal and external ear normal.     Left Ear: Hearing, tympanic membrane, ear canal and external ear normal.     Nose: Nose normal.     Mouth/Throat:     Lips: Pink.     Mouth: Mucous membranes are moist.     Dentition: Has dentures.     Pharynx: Oropharynx is clear. Uvula midline.  Eyes:     General: Lids are normal.     Extraocular Movements: Extraocular movements intact.     Conjunctiva/sclera: Conjunctivae normal.     Pupils: Pupils are equal, round, and reactive to light.  Neck:     Thyroid : No thyroid  mass, thyromegaly or thyroid  tenderness.     Vascular: No carotid bruit or JVD.     Trachea: Trachea and phonation normal.  Cardiovascular:     Rate and Rhythm: Normal rate and regular rhythm.     Chest Wall: PMI is not displaced.     Pulses:          Dorsalis pedis pulses are 2+ on the right side and 2+ on the left side.       Posterior tibial pulses are 2+ on the right side and 2+ on the left side.     Heart sounds: Normal heart sounds. No murmur heard.    No friction rub. No gallop.  Pulmonary:     Effort: Pulmonary effort is normal. No respiratory distress.     Breath sounds: Normal breath sounds. No wheezing.  Chest:     Chest wall: No mass.  Breasts:    Breasts are symmetrical.  Abdominal:     General: Abdomen is flat. Bowel sounds  are normal. There is no distension or abdominal bruit.     Palpations: Abdomen is soft. There is no hepatomegaly or splenomegaly.     Tenderness: There is no abdominal tenderness. There is no right CVA tenderness or left CVA tenderness.     Hernia: No hernia is present.  Musculoskeletal:        General: Normal range of motion.     Cervical back: Full passive range of motion without pain, normal range of motion and neck supple.     Right lower leg: No edema.     Left lower leg: No edema.  Feet:     Right foot:     Skin integrity: Skin integrity normal.     Toenail Condition: Right toenails are normal.     Left foot:     Skin integrity: Skin integrity normal.     Toenail Condition: Left toenails are normal.  Lymphadenopathy:     Cervical: No cervical adenopathy.     Upper Body:     Right upper body: No supraclavicular, axillary or pectoral adenopathy.     Left upper body: No supraclavicular, axillary or pectoral adenopathy.  Skin:    General: Skin is warm and dry.     Capillary Refill: Capillary refill takes less than 2 seconds.     Coloration: Skin is not cyanotic, jaundiced or pale.     Findings: No rash.  Neurological:     General: No focal deficit present.     Mental Status: He is alert and oriented to person, place, and time.     Sensory: Sensation is intact.     Motor: Motor function is intact.     Coordination: Coordination is intact.     Gait: Gait is intact.     Deep Tendon Reflexes:  Reflexes are normal and symmetric.  Psychiatric:        Attention and Perception: Attention and perception normal.        Mood and Affect: Mood and affect normal.        Speech: Speech is rapid and pressured.        Behavior: Behavior normal. Behavior is cooperative.        Thought Content: Thought content normal.        Cognition and Memory: Cognition and memory normal.        Judgment: Judgment normal.       Results for orders placed or performed in visit on 04/13/23  Iron and  TIBC   Collection Time: 04/13/23  2:42 PM  Result Value Ref Range   Iron 88 45 - 182 ug/dL   TIBC 161 096 - 045 ug/dL   Saturation Ratios 26 17.9 - 39.5 %   UIBC 253 ug/dL  Ferritin   Collection Time: 04/13/23  2:42 PM  Result Value Ref Range   Ferritin 89 24 - 336 ng/mL  Comprehensive metabolic panel   Collection Time: 04/13/23  2:42 PM  Result Value Ref Range   Sodium 139 135 - 145 mmol/L   Potassium 3.8 3.5 - 5.1 mmol/L   Chloride 103 98 - 111 mmol/L   CO2 28 22 - 32 mmol/L   Glucose, Bld 118 (H) 70 - 99 mg/dL   BUN 17 8 - 23 mg/dL   Creatinine, Ser 4.09 (H) 0.61 - 1.24 mg/dL   Calcium 9.7 8.9 - 81.1 mg/dL   Total Protein 7.4 6.5 - 8.1 g/dL   Albumin 4.1 3.5 - 5.0 g/dL   AST 22 15 - 41 U/L   ALT 21 0 - 44 U/L   Alkaline Phosphatase 86 38 - 126 U/L   Total Bilirubin 0.5 0.0 - 1.2 mg/dL   GFR, Estimated 57 (L) >60 mL/min   Anion gap 8 5 - 15  CBC with Differential/Platelet   Collection Time: 04/13/23  2:42 PM  Result Value Ref Range   WBC 6.0 4.0 - 10.5 K/uL   RBC 4.74 4.22 - 5.81 MIL/uL   Hemoglobin 14.9 13.0 - 17.0 g/dL   HCT 91.4 78.2 - 95.6 %   MCV 95.6 80.0 - 100.0 fL   MCH 31.4 26.0 - 34.0 pg   MCHC 32.9 30.0 - 36.0 g/dL   RDW 21.3 08.6 - 57.8 %   Platelets 158 150 - 400 K/uL   nRBC 0.0 0.0 - 0.2 %   Neutrophils Relative % 75 %   Neutro Abs 4.5 1.7 - 7.7 K/uL   Lymphocytes Relative 12 %   Lymphs Abs 0.7 0.7 - 4.0 K/uL   Monocytes Relative 9 %   Monocytes Absolute 0.5 0.1 - 1.0 K/uL   Eosinophils Relative 3 %   Eosinophils Absolute 0.2 0.0 - 0.5 K/uL   Basophils Relative 1 %   Basophils Absolute 0.1 0.0 - 0.1 K/uL   Immature Granulocytes 0 %   Abs Immature Granulocytes 0.02 0.00 - 0.07 K/uL       Pertinent labs & imaging results that were available during my care of the patient were reviewed by me and considered in my medical decision making.  Assessment & Plan:  Maikel was seen today for medical management of chronic issues.  Diagnoses and all  orders for this visit:  Aortic atherosclerosis (HCC) -     CMP14+EGFR -     CBC with Differential/Platelet -  Lipid panel  Acquired hypothyroidism -     levothyroxine  (SYNTHROID ) 50 MCG tablet; Take 1 tablet (50 mcg total) by mouth daily. -     Thyroid  Panel With TSH -     Lipid panel -     Vitamin B12  Heavy cigarette smoker -     CBC with Differential/Platelet  Mixed hyperlipidemia -     CMP14+EGFR -     Lipid panel -     aspirin EC 81 MG tablet; Take 1 tablet (81 mg total) by mouth daily. Swallow whole.  S/P thymectomy -     VITAMIN D 25 Hydroxy (Vit-D Deficiency, Fractures) -     Vitamin B12 -     Folate -     Vitamin B1  Thiamin deficiency -     Vitamin B12 -     Folate -     Vitamin B1  Nocturia more than twice per night -     PSA, total and free  Vitamin B 12 deficiency -     Vitamin B12 -     Folate -     Vitamin B1  Long toenail -     Ambulatory referral to Podiatry  Elevated glucose -     Bayer DCA Hb A1c Waived     Assessment and Plan    Emphysema Emphysema with continued smoking. Discussed smoking risks and impact on emphysema progression. - Strongly encourage smoking cessation.  Aortic atherosclerosis Aortic atherosclerosis identified on recent CT scan. Asymptomatic. Discussed cardiovascular risk management. - Initiate 81 mg aspirin daily to reduce cardiovascular risk.  Elevated creatinine Elevated creatinine suggesting possible renal impairment. No changes in urinary habits. - Recheck renal function tests.  Hypothyroidism Hypothyroidism with discontinued levothyroxine  for two months. No symptoms reported. - Recheck thyroid  function tests. - Consider restarting levothyroxine  based on results.  History of cancer No evidence of recurrent tumor on recent CT scan. Under regular oncologist follow-up. - Continue regular oncologist follow-up for cancer surveillance.  General Health Maintenance No recent colonoscopy or Cologuard results.  Attempted Cologuard without receiving results. - Encourage completion of colon cancer screening via colonoscopy or Cologuard.  Goals of Care Expressed concerns about end-of-life planning and burial arrangements. Occasionally attends church for arrangements. - Discuss advanced directives and end-of-life planning in future visits.  Follow-up Plan to update laboratory tests for current health status and medication needs. - Obtain blood work for thyroid  and renal function, and other relevant parameters.          Continue all other maintenance medications.  Follow up plan: Return in 6 months (on 12/02/2023), or if symptoms worsen or fail to improve, for chronic follow up.   Continue healthy lifestyle choices, including diet (rich in fruits, vegetables, and lean proteins, and low in salt and simple carbohydrates) and exercise (at least 30 minutes of moderate physical activity daily).  Educational handout given for health maintenance   The above assessment and management plan was discussed with the patient. The patient verbalized understanding of and has agreed to the management plan. Patient is aware to call the clinic if they develop any new symptoms or if symptoms persist or worsen. Patient is aware when to return to the clinic for a follow-up visit. Patient educated on when it is appropriate to go to the emergency department.   Kattie Parrot, FNP-C [facility] [phone]

## 2023-06-03 NOTE — Addendum Note (Signed)
 Addended by: Galvin Jules on: 06/03/2023 12:41 PM   Modules accepted: Orders

## 2023-06-04 ENCOUNTER — Telehealth: Payer: Self-pay

## 2023-06-04 NOTE — Telephone Encounter (Signed)
Refer to lab note °

## 2023-06-04 NOTE — Telephone Encounter (Signed)
 Copied from CRM [id number]. Topic: Clinical - Lab/Test Results >> Jun 04, 2023  8:43 AM Blair Bumpers wrote: Reason for CRM: Patient returning call that he missed from nurse in regards to labs. Called [facility] to try to reach [place] and Joie Narrow stated to just go ahead and read the labs to patient. Attempted to read the results per Dr. Carlyle Childes and patient kept rambling on and not really listening & had a lot of questions. He would like for the nurse to give him a call and go over these labs. CB #: [id number].

## 2023-06-05 LAB — CBC WITH DIFFERENTIAL/PLATELET
Basophils Absolute: 0.1 10*3/uL (ref 0.0–0.2)
Basos: 2 %
EOS (ABSOLUTE): 0.2 10*3/uL (ref 0.0–0.4)
Eos: 4 %
Hematocrit: 41.4 % (ref 37.5–51.0)
Hemoglobin: 13.8 g/dL (ref 13.0–17.7)
Immature Grans (Abs): 0 10*3/uL (ref 0.0–0.1)
Immature Granulocytes: 0 %
Lymphocytes Absolute: 0.8 10*3/uL (ref 0.7–3.1)
Lymphs: 17 %
MCH: 31.4 pg (ref 26.6–33.0)
MCHC: 33.3 g/dL (ref 31.5–35.7)
MCV: 94 fL (ref 79–97)
Monocytes Absolute: 0.5 10*3/uL (ref 0.1–0.9)
Monocytes: 10 %
Neutrophils Absolute: 3.1 10*3/uL (ref 1.4–7.0)
Neutrophils: 67 %
Platelets: 165 10*3/uL (ref 150–450)
RBC: 4.4 x10E6/uL (ref 4.14–5.80)
RDW: 12.5 % (ref 11.6–15.4)
WBC: 4.6 10*3/uL (ref 3.4–10.8)

## 2023-06-05 LAB — PSA, TOTAL AND FREE
PSA, Free Pct: 15.7 %
PSA, Free: 0.33 ng/mL
Prostate Specific Ag, Serum: 2.1 ng/mL (ref 0.0–4.0)

## 2023-06-05 LAB — CMP14+EGFR
ALT: 20 IU/L (ref 0–44)
AST: 22 IU/L (ref 0–40)
Albumin: 4.2 g/dL (ref 3.9–4.9)
Alkaline Phosphatase: 113 IU/L (ref 44–121)
BUN/Creatinine Ratio: 13 (ref 10–24)
BUN: 21 mg/dL (ref 8–27)
Bilirubin Total: 0.3 mg/dL (ref 0.0–1.2)
CO2: 23 mmol/L (ref 20–29)
Calcium: 10.1 mg/dL (ref 8.6–10.2)
Chloride: 103 mmol/L (ref 96–106)
Creatinine, Ser: 1.57 mg/dL — ABNORMAL HIGH (ref 0.76–1.27)
Globulin, Total: 2.7 g/dL (ref 1.5–4.5)
Glucose: 89 mg/dL (ref 70–99)
Potassium: 4.3 mmol/L (ref 3.5–5.2)
Sodium: 139 mmol/L (ref 134–144)
Total Protein: 6.9 g/dL (ref 6.0–8.5)
eGFR: 48 mL/min/{1.73_m2} — ABNORMAL LOW (ref >59)

## 2023-06-05 LAB — THYROID PANEL WITH TSH
Free Thyroxine Index: 1.7 (ref 1.2–4.9)
T3 Uptake Ratio: 32 % (ref 24–39)
T4, Total: 5.3 ug/dL (ref 4.5–12.0)
TSH: 8.33 u[IU]/mL — ABNORMAL HIGH (ref 0.450–4.500)

## 2023-06-05 LAB — LIPID PANEL
Chol/HDL Ratio: 2.7 ratio (ref 0.0–5.0)
Cholesterol, Total: 167 mg/dL (ref 100–199)
HDL: 62 mg/dL (ref >39)
LDL Chol Calc (NIH): 86 mg/dL (ref 0–99)
Triglycerides: 109 mg/dL (ref 0–149)
VLDL Cholesterol Cal: 19 mg/dL (ref 5–40)

## 2023-06-05 LAB — FOLATE: Folate: 10.1 ng/mL (ref >3.0)

## 2023-06-05 LAB — VITAMIN B1: Thiamine: 119.8 nmol/L (ref 66.5–200.0)

## 2023-06-05 LAB — VITAMIN D 25 HYDROXY (VIT D DEFICIENCY, FRACTURES): Vit D, 25-Hydroxy: 26.7 ng/mL — ABNORMAL LOW (ref 30.0–100.0)

## 2023-06-05 LAB — VITAMIN B12: Vitamin B-12: 311 pg/mL (ref 232–1245)

## 2023-07-08 ENCOUNTER — Emergency Department (HOSPITAL_COMMUNITY)
Admission: EM | Admit: 2023-07-08 | Discharge: 2023-07-09 | Disposition: A | Discharge status: Other Acute Inpatient Hospital | Attending: Emergency Medicine | Admitting: Emergency Medicine

## 2023-07-08 ENCOUNTER — Encounter (HOSPITAL_COMMUNITY): Payer: Self-pay

## 2023-07-08 ENCOUNTER — Other Ambulatory Visit: Payer: Self-pay

## 2023-07-08 ENCOUNTER — Emergency Department (HOSPITAL_COMMUNITY)

## 2023-07-08 DIAGNOSIS — Z79899 Other long term (current) drug therapy: Secondary | ICD-10-CM | POA: Insufficient documentation

## 2023-07-08 DIAGNOSIS — R4182 Altered mental status, unspecified: Secondary | ICD-10-CM | POA: Diagnosis not present

## 2023-07-08 DIAGNOSIS — F29 Unspecified psychosis not due to a substance or known physiological condition: Secondary | ICD-10-CM | POA: Diagnosis not present

## 2023-07-08 DIAGNOSIS — R000 Tachycardia, unspecified: Secondary | ICD-10-CM | POA: Diagnosis not present

## 2023-07-08 DIAGNOSIS — Z7982 Long term (current) use of aspirin: Secondary | ICD-10-CM | POA: Diagnosis not present

## 2023-07-08 DIAGNOSIS — R9389 Abnormal findings on diagnostic imaging of other specified body structures: Secondary | ICD-10-CM | POA: Diagnosis not present

## 2023-07-08 DIAGNOSIS — R456 Violent behavior: Secondary | ICD-10-CM | POA: Diagnosis present

## 2023-07-08 HISTORY — DX: Neoplasm of unspecified behavior of other specified sites: D49.89

## 2023-07-08 HISTORY — DX: Malignant (primary) neoplasm, unspecified: C80.1

## 2023-07-08 LAB — CBC WITH DIFFERENTIAL/PLATELET
Abs Immature Granulocytes: 0.04 10*3/uL (ref 0.00–0.07)
Basophils Absolute: 0.1 10*3/uL (ref 0.0–0.1)
Basophils Relative: 1 %
Eosinophils Absolute: 0.1 10*3/uL (ref 0.0–0.5)
Eosinophils Relative: 1 %
HCT: 46.6 % (ref 39.0–52.0)
Hemoglobin: 15.9 g/dL (ref 13.0–17.0)
Immature Granulocytes: 1 %
Lymphocytes Relative: 9 %
Lymphs Abs: 0.7 10*3/uL (ref 0.7–4.0)
MCH: 31.1 pg (ref 26.0–34.0)
MCHC: 34.1 g/dL (ref 30.0–36.0)
MCV: 91 fL (ref 80.0–100.0)
Monocytes Absolute: 0.6 10*3/uL (ref 0.1–1.0)
Monocytes Relative: 8 %
Neutro Abs: 5.7 10*3/uL (ref 1.7–7.7)
Neutrophils Relative %: 80 %
Platelets: 200 10*3/uL (ref 150–400)
RBC: 5.12 MIL/uL (ref 4.22–5.81)
RDW: 12.5 % (ref 11.5–15.5)
WBC: 7.2 10*3/uL (ref 4.0–10.5)
nRBC: 0 % (ref 0.0–0.2)

## 2023-07-08 LAB — COMPREHENSIVE METABOLIC PANEL WITH GFR
ALT: 20 U/L (ref 0–44)
AST: 30 U/L (ref 15–41)
Albumin: 4.8 g/dL (ref 3.5–5.0)
Alkaline Phosphatase: 92 U/L (ref 38–126)
Anion gap: 13 (ref 5–15)
BUN: 23 mg/dL (ref 8–23)
CO2: 22 mmol/L (ref 22–32)
Calcium: 10.3 mg/dL (ref 8.9–10.3)
Chloride: 99 mmol/L (ref 98–111)
Creatinine, Ser: 1.84 mg/dL — ABNORMAL HIGH (ref 0.61–1.24)
GFR, Estimated: 39 mL/min — ABNORMAL LOW (ref >60)
Glucose, Bld: 102 mg/dL — ABNORMAL HIGH (ref 70–99)
Potassium: 3.9 mmol/L (ref 3.5–5.1)
Sodium: 134 mmol/L — ABNORMAL LOW (ref 135–145)
Total Bilirubin: 0.8 mg/dL (ref 0.0–1.2)
Total Protein: 8.4 g/dL — ABNORMAL HIGH (ref 6.5–8.1)

## 2023-07-08 LAB — SALICYLATE LEVEL: Salicylate Lvl: <7 mg/dL — ABNORMAL LOW (ref 7.0–30.0)

## 2023-07-08 LAB — ETHANOL: Alcohol, Ethyl (B): <15 mg/dL (ref <15)

## 2023-07-08 LAB — ACETAMINOPHEN LEVEL: Acetaminophen (Tylenol), Serum: <10 ug/mL — ABNORMAL LOW (ref 10–30)

## 2023-07-08 LAB — TSH: TSH: 14.869 u[IU]/mL — ABNORMAL HIGH (ref 0.350–4.500)

## 2023-07-08 LAB — CK: Total CK: 165 U/L (ref 49–397)

## 2023-07-08 MED ORDER — ZIPRASIDONE MESYLATE 20 MG IM SOLR
10.0000 mg | Freq: Once | INTRAMUSCULAR | Status: AC
  Administered 2023-07-08: 10 mg via INTRAMUSCULAR
  Filled 2023-07-08: qty 20

## 2023-07-08 MED ORDER — LORAZEPAM 2 MG/ML IJ SOLN
1.0000 mg | Freq: Once | INTRAMUSCULAR | Status: AC
  Administered 2023-07-08: 1 mg via INTRAVENOUS
  Filled 2023-07-08: qty 1

## 2023-07-08 MED ORDER — LORAZEPAM 1 MG PO TABS
1.0000 mg | ORAL_TABLET | Freq: Four times a day (QID) | ORAL | Status: DC | PRN
  Filled 2023-07-08: qty 1

## 2023-07-08 MED ORDER — LACTATED RINGERS IV BOLUS
1000.0000 mL | Freq: Once | INTRAVENOUS | Status: AC
  Administered 2023-07-08: 1000 mL via INTRAVENOUS

## 2023-07-08 MED ORDER — NICOTINE 21 MG/24HR TD PT24
21.0000 mg | MEDICATED_PATCH | Freq: Every day | TRANSDERMAL | Status: DC
  Administered 2023-07-08: 21 mg via TRANSDERMAL
  Filled 2023-07-08: qty 1

## 2023-07-08 MED ORDER — LEVOTHYROXINE SODIUM 50 MCG PO TABS
50.0000 ug | ORAL_TABLET | Freq: Every day | ORAL | Status: DC
  Administered 2023-07-09: 50 ug via ORAL
  Filled 2023-07-08: qty 1

## 2023-07-08 NOTE — BH Assessment (Signed)
 Clinician messaged Devonna Foley, RN: "Hey. It's Trey with TTS. Is the pt able to engage in the assessment, if so the pt will need to be placed in a private room. Is the pt under IVC? Also is the pt medically cleared?"    Clinician awaiting response.    Rosi Converse, MS, [facility], [facility] Triage Specialist [phone]
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
## 2023-07-08 NOTE — ED Notes (Signed)
 ED Provider at bedside.

## 2023-07-08 NOTE — ED Notes (Signed)
 Pt is verbally getting loud and aggressive with flight of ideas. Concerned for pt's safety at this time. Pt not aggressive towards staff at this time. EDP made aware.

## 2023-07-08 NOTE — ED Notes (Addendum)
 Pt asking to smoke a cigarette. Pt told he cannot. Pt asked for nicotine  gum and a patch. Pt told we do not have the gum but we have patches. EDP made aware.   Pt is using foul language. Pt has been redirected by sitter and this RN. Pt will calm down briefly but then returns to being verbally loud with lewd comments.

## 2023-07-08 NOTE — Progress Notes (Signed)
 Pt has been accepted to [facility] on 07/08/2023 . Bed assignment:[facility] B  Pt meets inpatient criteria per Dorthea Gauze, NP   Attending Physician will be Reinhold Carbine, MD  Report can be called to: [phone]  Pt can arrive after 8AM   Care Team Notified: Peggi Bowels, LCMHC, Melissa Ragland, RN

## 2023-07-08 NOTE — ED Notes (Signed)
 Pt refusing PO ativan . Security at bedside for pt safety. Pt continues to be verbally aggressive with threats of walking out. EDP made aware. See Mar
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
## 2023-07-08 NOTE — BH Assessment (Signed)
 Patient received sedation earlier and is sleeping it off.  Please contact TTS when patient is awake and alert enough to participate in the assessment process.

## 2023-07-08 NOTE — ED Notes (Signed)
 Attempted to use the urinal and ended up urinating in the floor. Unable to collect Urine sample at this time. EVS called to clean floor.

## 2023-07-08 NOTE — ED Notes (Signed)
 Patient Dressed out in Hospital appropriate scrubs, And wanded by security. Holiday representative watching patient.

## 2023-07-08 NOTE — ED Triage Notes (Signed)
 Pt arrived in [employer] custody following an incident at [facility] facility. Pt reportedly was "running my mouth" and Pt reports to "kill everybody and I guess I can be the anti-christ." Pt rambling in Triage.

## 2023-07-08 NOTE — ED Provider Notes (Signed)
 Pt signed out by Dr. Aldean Amass pending TSH and UA.   TSH elevated at 14.869.  I suspect pt has been noncompliant with his thyroid  medication.  Synthroid  restarted.   Pt urinated on the floor and so ua still pending at shift change.  Nurses are going to do an I/o. He was seen by TTS and meets inpatient criteria.  He has been accepted to [facility] tomorrow morning by Dr. Reinhold Carbine.   Sueellen Emery, MD 07/08/23 2355

## 2023-07-08 NOTE — ED Provider Notes (Addendum)
 [place] EMERGENCY DEPARTMENT AT [facility] Provider Note   CSN: [id number] Arrival date & time: 07/08/23  1227     History  Chief Complaint  Patient presents with   Medical Clearance    Antonio Scott is a 68 y.o. male.  HPI 68 year old male presents in police custody.  Patient showed up at the [employer] and started threatening to kill people.  He then got in his car and started doing donuts in the parking lot.  Police arrested him and filled out emergency IVC paperwork.  No family is available.  Unfortunately, the patient is psychotic appearing and tangential and has rapid and pressured speech.  I am unable to get a clear history.  Home Medications Prior to Admission medications   Medication Sig Start Date End Date Taking? Authorizing Provider  aspirin  EC 81 MG tablet Take 1 tablet (81 mg total) by mouth daily. Swallow whole. 06/02/23   Galvin Jules, FNP  levothyroxine  (SYNTHROID ) 50 MCG tablet Take 1 tablet (50 mcg total) by mouth daily. 06/02/23   Galvin Jules, FNP  thiamine  (VITAMIN B1) 100 MG tablet Take 100 mg by mouth daily. 02/04/22   [provider]      Allergies    Patient has no known allergies.    Review of Systems   Review of Systems  Unable to perform ROS: Psychiatric disorder    Physical Exam Updated Vital Signs BP 90/64   Pulse 89   Temp 98.3 F (36.8 C) (Temporal)   Resp 19   Ht 5\' 10"  (1.778 m)   Wt 64.6 kg   SpO2 96%   BMI 20.43 kg/m  Physical Exam Vitals and nursing note reviewed.  Constitutional:      Appearance: He is well-developed.  HENT:     Head: Normocephalic and atraumatic.  Cardiovascular:     Rate and Rhythm: Regular rhythm. Tachycardia present.     Heart sounds: Normal heart sounds.  Pulmonary:     Effort: Pulmonary effort is normal.     Breath sounds: Normal breath sounds.  Abdominal:     General: There is no distension.     Palpations: Abdomen is soft.     Tenderness: There is no abdominal  tenderness.  Skin:    General: Skin is warm and dry.  Neurological:     Mental Status: He is alert.     Comments: Awake, alert, moves all 4 extremities equally.  Psychiatric:        Speech: Speech is rapid and pressured and tangential.     Comments: Patient is speaking rapidly and at times is agitated though can be de-escalated.  He is frequently moving and not sitting still very well.     ED Results / Procedures / Treatments   Labs (all labs ordered are listed, but only abnormal results are displayed) Labs Reviewed  COMPREHENSIVE METABOLIC PANEL WITH GFR - Abnormal; Notable for the following components:      Result Value   Sodium 134 (*)    Glucose, Bld 102 (*)    Creatinine, Ser 1.84 (*)    Total Protein 8.4 (*)    GFR, Estimated 39 (*)    All other components within normal limits  SALICYLATE LEVEL - Abnormal; Notable for the following components:   Salicylate Lvl <7.0 (*)    All other components within normal limits  ACETAMINOPHEN  LEVEL - Abnormal; Notable for the following components:   Acetaminophen  (Tylenol ), Serum <10 (*)  All other components within normal limits  ETHANOL  CK  CBC WITH DIFFERENTIAL/PLATELET  RAPID URINE DRUG SCREEN, HOSP PERFORMED  URINALYSIS, ROUTINE W REFLEX MICROSCOPIC  TSH    EKG None  Radiology CT Head Wo Contrast Result Date: 07/08/2023 CLINICAL DATA:  Mental status change, unknown cause EXAM: CT HEAD WITHOUT CONTRAST TECHNIQUE: Contiguous axial images were obtained from the base of the skull through the vertex without intravenous contrast. RADIATION DOSE REDUCTION: This exam was performed according to the departmental dose-optimization program which includes automated exposure control, adjustment of the mA and/or kV according to patient size and/or use of iterative reconstruction technique. COMPARISON:  September 07, 2021 FINDINGS: Significantly degraded by motion. Brain: Proportional prominence of the ventricles and sulci, consistent with  diffuse cerebral parenchymal volume loss. The ventricles otherwise maintained midline position without midline shift. Gray-white differentiation is preserved.Periventricular and subcortical white matter hypoattenuation, most consistent with changes of moderate chronic ischemic microvascular disease. Unchanged chronic infarct within the left occipital lobe. No evidence of acute territorial infarction, extra-axial fluid collection, hemorrhage, or mass lesion. The basilar cisterns are patent without downward herniation. The cerebellar hemispheres and vermis are well formed without mass lesion or focal attenuation abnormality. Vascular: No hyperdense vessel. Skull: Normal. Negative for fracture or focal lesion. Sinuses/Orbits: The paranasal sinuses and mastoids are clear. The globes appear intact. No retrobulbar hematoma. Other: None. IMPRESSION: Motion degraded study. Otherwise, no acute intracranial abnormality, specifically, no acute hemorrhage, territorial infarction, or intracranial mass. Electronically Signed   By: Rance Burrows M.D.   On: 07/08/2023 14:23   DG Chest Portable 1 View Result Date: 07/08/2023 CLINICAL DATA:  Altered mental status. EXAM: PORTABLE CHEST 1 VIEW COMPARISON:  09/07/2021 FINDINGS: Normal sized heart. Clear lungs with normal vascularity. Mild biapical pleural and parenchymal scarring, unchanged. Stable mild elevation of the left hemidiaphragm. Unremarkable bones. Gas-filled loops of colon and small bowel without abnormal dilatation. IMPRESSION: No acute abnormality. Electronically Signed   By: Catherin Closs M.D.   On: 07/08/2023 14:16    Procedures .Critical Care  Performed by: Jerilynn Montenegro, MD Authorized by: Jerilynn Montenegro, MD   Critical care provider statement:    Critical care time (minutes):  35   Critical care time was exclusive of:  Separately billable procedures and treating other patients   Critical care was necessary to treat or prevent imminent or  life-threatening deterioration of the following conditions:  CNS failure or compromise   Critical care was time spent personally by me on the following activities:  Development of treatment plan with patient or surrogate, discussions with consultants, evaluation of patient's response to treatment, examination of patient, ordering and review of laboratory studies, ordering and review of radiographic studies, ordering and performing treatments and interventions, pulse oximetry, re-evaluation of patient's condition and review of old charts     Medications Ordered in ED Medications  ziprasidone  (GEODON ) injection 10 mg (10 mg Intramuscular Given 07/08/23 1400)  lactated ringers  bolus 1,000 mL (1,000 mLs Intravenous Bolus 07/08/23 1400)    ED Course/ Medical Decision Making/ A&P                                 Medical Decision Making Amount and/or Complexity of Data Reviewed Independent Historian:     Details: Police Labs: ordered.    Details: Mild bump in creatinine Radiology: ordered and independent interpretation performed.    Details: No head bleed ECG/medicine tests: ordered and independent interpretation  performed.    Details: Sinus tachycardia  Risk Prescription drug management.   Patient is acutely psychotic.  He required Geodon  for both medical workup and control of his symptoms.  He is now asleep though his oxygen is normal and his heart rate has normalized.  He has some soft blood pressures but I think this is due to his overstimulation and now sedation.  He was given some fluids.  Mild bump in his creatinine but not consistent with AKI.  Urine is pending but this seems like an primary psychiatric disease, similar to 2023.  Will consult TTS.  Dr. Jake Mayers will follow-up on urinalysis.        Final Clinical Impression(s) / ED Diagnoses Final diagnoses:  None    Rx / DC Orders ED Discharge Orders     None         Jerilynn Montenegro, MD 07/08/23 1541    Jerilynn Montenegro, MD 07/08/23 [phone]

## 2023-07-08 NOTE — ED Notes (Signed)
 Pt's brother called and spoke to RN regarding pt's disposition. Brother states last time pt was "like this", it was related to his medical condition.

## 2023-07-08 NOTE — Progress Notes (Signed)
 Inpatient Psychiatric Referral  Patient was recommended inpatient per Dorthea Gauze, NP . There are no available beds at [facility], per [facility] Deno Flair, RN. Patient was referred to the following out of network facilities:  Destination  Service Provider Request Status Address Phone Fax  [facility] Pending - Request Sent [address], [place] [address] [phone]  [facility] Pending - Request Sent [address] [place] [address] [phone]  [facility] Pending - Request Sent [address] [place], [place] [address] [phone]  [facility] Pending - Request Sent [address] Johnella Naas [place] [address] [phone]  [facility] Pending - Request Sent [address] [place] [address] [phone]  [facility] Pending - Request Sent [address] [place] [address] [phone]  [facility] Pending - Request Sent [address] Shelva Dice [place] [address] [phone]  [facility] Pending - Request Sent [address] [place] [address] [phone]  [facility] Pending - Request Sent [address] Sharren Decree [place] [phone]  [facility] Pending - Request Sent [address] [place] [address]      Situation ongoing, CSW to continue following and update chart as more information becomes available.   Albertus Alt MSW, LCSWA 07/08/2023  9:58PM

## 2023-07-08 NOTE — ED Notes (Signed)
Pt TTS 

## 2023-07-09 DIAGNOSIS — F29 Unspecified psychosis not due to a substance or known physiological condition: Secondary | ICD-10-CM | POA: Diagnosis not present

## 2023-07-09 DIAGNOSIS — Z7901 Long term (current) use of anticoagulants: Secondary | ICD-10-CM | POA: Diagnosis not present

## 2023-07-09 DIAGNOSIS — F17210 Nicotine dependence, cigarettes, uncomplicated: Secondary | ICD-10-CM | POA: Diagnosis not present

## 2023-07-09 DIAGNOSIS — E039 Hypothyroidism, unspecified: Secondary | ICD-10-CM | POA: Diagnosis not present

## 2023-07-09 DIAGNOSIS — Z9151 Personal history of suicidal behavior: Secondary | ICD-10-CM | POA: Diagnosis not present

## 2023-07-09 DIAGNOSIS — F333 Major depressive disorder, recurrent, severe with psychotic symptoms: Secondary | ICD-10-CM | POA: Diagnosis not present

## 2023-07-09 DIAGNOSIS — F250 Schizoaffective disorder, bipolar type: Secondary | ICD-10-CM | POA: Diagnosis not present

## 2023-07-09 DIAGNOSIS — Z91148 Patient's other noncompliance with medication regimen for other reason: Secondary | ICD-10-CM | POA: Diagnosis not present

## 2023-07-09 DIAGNOSIS — R45850 Homicidal ideations: Secondary | ICD-10-CM | POA: Diagnosis not present

## 2023-07-09 DIAGNOSIS — M1990 Unspecified osteoarthritis, unspecified site: Secondary | ICD-10-CM | POA: Diagnosis not present

## 2023-07-09 LAB — URINALYSIS, ROUTINE W REFLEX MICROSCOPIC
Bilirubin Urine: NEGATIVE
Glucose, UA: NEGATIVE mg/dL
Hgb urine dipstick: NEGATIVE
Ketones, ur: NEGATIVE mg/dL
Leukocytes,Ua: NEGATIVE
Nitrite: NEGATIVE
Protein, ur: NEGATIVE mg/dL
Specific Gravity, Urine: 1.008 (ref 1.005–1.030)
pH: 6 (ref 5.0–8.0)

## 2023-07-09 LAB — RAPID URINE DRUG SCREEN, HOSP PERFORMED
Amphetamines: NOT DETECTED
Barbiturates: NOT DETECTED
Benzodiazepines: NOT DETECTED
Cocaine: NOT DETECTED
Opiates: NOT DETECTED
Tetrahydrocannabinol: NOT DETECTED

## 2023-07-09 MED ORDER — STERILE WATER FOR INJECTION IJ SOLN: 1.2000 mL | Freq: Once | INTRAMUSCULAR | Status: AC

## 2023-07-09 MED ORDER — STERILE WATER FOR INJECTION IJ SOLN
INTRAMUSCULAR | Status: AC
  Administered 2023-07-09: 1.2 mL via INTRAMUSCULAR
  Filled 2023-07-09: qty 10

## 2023-07-09 MED ORDER — ZIPRASIDONE MESYLATE 20 MG IM SOLR
10.0000 mg | Freq: Once | INTRAMUSCULAR | Status: AC
  Administered 2023-07-09: 10 mg via INTRAMUSCULAR
  Filled 2023-07-09: qty 20

## 2023-07-09 NOTE — ED Notes (Signed)
 Antonio Scott called and asked where the pt was. Nurse explained pt was with police d/t IVC and left around 8 this morning. 1000am, 07/09/2023

## 2023-07-09 NOTE — ED Notes (Signed)
 Unable to give pt morning medication at this time d/t pt asleep and lethargic

## 2023-07-09 NOTE — Discharge Instructions (Signed)
 Please take your medications as prescribed. Please follow up with your primary care physician within 1-2 weeks for lab recheck of your thyroid  studies.

## 2023-07-09 NOTE — ED Notes (Signed)
 This RN and NT went to place pt on monitoring equipment and to in and out cath pt. While placing cardiac leads on pt, pt started stirring. This RN attempted to let pt know what we were doing and going to do. Pt asked if they needed to urinate and pt stated yes. Urinal used and pt  urinated. Pt went back to resting peacefully.

## 2023-07-09 NOTE — ED Provider Notes (Signed)
 Emergency Medicine Observation Re-evaluation Note  Antonio Scott is a 68 y.o. male, seen on rounds today.  Pt initially presented to the ED for complaints of Medical Clearance Currently, the patient is sitting in bed.  BP 98/76   Pulse 66   Temp 98.3 F (36.8 C) (Temporal)   Resp 16   Ht 5\' 10"  (1.778 m)   Wt 64.6 kg   SpO2 98%   BMI 20.43 kg/m   ED Course / MDM  EKG:EKG Interpretation Date/Time:  Wednesday July 08 2023 14:10:45 EDT Ventricular Rate:  114 PR Interval:  142 QRS Duration:  84 QT Interval:  324 QTC Calculation: 446 R Axis:   -50  Text Interpretation: Confirmed by Jerilynn Montenegro [phone]) on 07/08/2023 3:40:36 PM  I have reviewed the labs performed to date as well as medications administered while in observation.  Plan  Current plan is for transfer to [facility], accepted by Dr. Reinhold Carbine .    Merdis Stalling, MD 07/13/23 [phone]

## 2023-07-09 NOTE — ED Notes (Signed)
 Bladder scan done. There was concern of pt's agitation to an in and out by staff and security. EDP made aware. See new orders.

## 2023-07-24 ENCOUNTER — Telehealth: Payer: Self-pay

## 2023-07-24 NOTE — Telephone Encounter (Signed)
 Attempted to contact patient - NA

## 2023-07-24 NOTE — Telephone Encounter (Signed)
 Copied from CRM [id number]. Topic: Clinical - Pink Word Triage >> Jul 24, 2023  4:27 PM Tiffany H wrote: Patient called to advise that he was taken to [facility]. Please see documents from 07/09/23 and 07/21/23. Patient was prescribed additional medications on 07/21/23. He was also remitted to the [facility] for doing donuts. Patient advised he's going to pick up his medication.   He advised that he had explosive bowels the other day and was very concerned about not taking any medication that would cause this to continue. Declined to be triaged.   Was told they were taking Dr. Aron Lard off of his list. Patient said he received a letter but it isn't listed in the Letters folder.   Please follow up with patient to do a wellness check and reconcile new medications.

## 2023-07-27 ENCOUNTER — Telehealth: Payer: Self-pay | Admitting: Family Medicine

## 2023-07-27 NOTE — Telephone Encounter (Signed)
 Reviewed with pt that his urine did not show any signs of infections and that he needs to take his thyroid  medication everyday. Pt states that he does take 72mcg's. He does not take it everyday. States that he wakes up sometimes to late to take it. Reinforced the importance of taking his thyroid  medication.  Pt is upset about a letter received from [facility]. He is unsure what the letter means. Believes it may be a bill. He is not going to pay because of how he was admitted. Per hospital note pt was doing donuts at [facility] and the police were called and he was transported to the hospital. Pt said he should charge (them) for fraud.  Offered to make a sooner appt with pt to recheck blood work and urine. Pt declined. He will will keep 7/10 appt and arrive at 10am.

## 2023-07-27 NOTE — Telephone Encounter (Signed)
 Copied from CRM [id number]. Topic: General - Other >> Jul 24, 2023  5:18 PM Kevelyn M wrote: Reason for CRM: Patient calling Harlene back. Please give him a call back.

## 2023-07-29 NOTE — Progress Notes (Unsigned)
 Patient walked in today for bilateral lower leg swelling.  States it had been going on x 3 weeks and he has been meaning to come in to get them looked at. I evaluated both legs- not hot to touch, states no SOB, leg are not oozing, states no other symptoms.  Appointment scheduled with DOD tomorrow.  Advised patient to elevate his legs tonight.  Patient kept getting distracted and talking about other random things.  Had to keep re focusing patient as to what was going on and reminding him to come back in tomorrow for appointment.  When patient left reminded him about appointment tomorrow and card given.  States he will be at appointment tomorrow.

## 2023-07-30 ENCOUNTER — Ambulatory Visit (INDEPENDENT_AMBULATORY_CARE_PROVIDER_SITE_OTHER): Admitting: Family

## 2023-07-30 ENCOUNTER — Encounter: Payer: Self-pay | Admitting: Family

## 2023-07-30 VITALS — BP 109/69 | HR 99 | Temp 98.1°F | Ht 70.0 in | Wt 154.0 lb

## 2023-07-30 DIAGNOSIS — F319 Bipolar disorder, unspecified: Secondary | ICD-10-CM

## 2023-07-30 DIAGNOSIS — Z91199 Patient's noncompliance with other medical treatment and regimen due to unspecified reason: Secondary | ICD-10-CM | POA: Diagnosis not present

## 2023-07-30 DIAGNOSIS — R600 Localized edema: Secondary | ICD-10-CM

## 2023-07-30 MED ORDER — FUROSEMIDE 20 MG PO TABS
20.0000 mg | ORAL_TABLET | Freq: Every day | ORAL | 0 refills | Status: DC
Start: 2023-07-30 — End: 2023-08-13

## 2023-07-30 NOTE — Progress Notes (Signed)
 Subjective:    Patient ID: Antonio Scott, male    DOB: May 23, 1955, 68 y.o.   MRN: [id number]  Chief Complaint  Patient presents with   Edema    BLE    HPI PT presents to the office today with bilateral leg swelling that started 3-7 days ago. Denies any recent changes or sitting for long periods of time. Denies pain.   Pt was recently was hospitalized on 07/08/23 and given medications of Trazodone , Divalproex, Risperidone , Seroquel, and Remeron. He has stopped all of these.    Review of Systems  All other systems reviewed and are negative.   Social History   Socioeconomic History   Marital status: Single    Spouse name: Not on file   Number of children: 0   Years of education: Not on file   Highest education level: Not on file  Occupational History   Occupation: retired  Tobacco Use   Smoking status: Every Day    Current packs/day: 1.00    Average packs/day: 0.6 packs/day for 105.5 years (65.5 ttl pk-yrs)    Types: Cigarettes    Start date: 1970   Smokeless tobacco: Never  Vaping Use   Vaping status: Never Used  Substance and Sexual Activity   Alcohol use: Not Currently   Drug use: Not Currently   Sexual activity: Not Currently  Other Topics Concern   Not on file  Social History Narrative   No children   Brother lives a mile away   Social Drivers of Health   Financial Resource Strain: Low Risk  (05/19/2023)   Overall Financial Resource Strain (CARDIA)    Difficulty of Paying Living Expenses: Not hard at all  Food Insecurity: No Food Insecurity (05/19/2023)   Hunger Vital Sign    Worried About Running Out of Food in the Last Year: Never true    Ran Out of Food in the Last Year: Never true  Transportation Needs: No Transportation Needs (05/19/2023)   PRAPARE - Administrator, Civil Service (Medical): No    Lack of Transportation (Non-Medical): No  Physical Activity: Insufficiently Active (05/19/2023)   Exercise Vital Sign    Days of Exercise per  Week: 3 days    Minutes of Exercise per Session: 30 min  Stress: No Stress Concern Present (05/19/2023)   [employer] of Occupational Health - Occupational Stress Questionnaire    Feeling of Stress : Not at all  Social Connections: Socially Isolated (05/19/2023)   Social Connection and Isolation Panel    Frequency of Communication with Friends and Family: Once a week    Frequency of Social Gatherings with Friends and Family: Once a week    Attends Religious Services: Never    Database administrator or Organizations: No    Attends Engineer, structural: Never    Marital Status: Never married   Family History  Problem Relation Age of Onset   Stroke Mother    Diabetes Mother    Cancer Father    Cancer Brother    Liver cancer Maternal Aunt    Colon cancer Paternal Grandfather         Objective:   Physical Exam Vitals reviewed.  Constitutional:      General: He is not in acute distress.    Appearance: He is well-developed.  HENT:     Head: Normocephalic.     Right Ear: Tympanic membrane normal.     Left Ear: Tympanic membrane normal.   Eyes:  General:        Right eye: No discharge.        Left eye: No discharge.     Pupils: Pupils are equal, round, and reactive to light.   Neck:     Thyroid : No thyromegaly.   Cardiovascular:     Rate and Rhythm: Normal rate and regular rhythm.     Heart sounds: Normal heart sounds. No murmur heard. Pulmonary:     Effort: Pulmonary effort is normal. No respiratory distress.     Breath sounds: Normal breath sounds. No wheezing.  Abdominal:     General: Bowel sounds are normal. There is no distension.     Palpations: Abdomen is soft.     Tenderness: There is no abdominal tenderness.   Musculoskeletal:        General: No tenderness. Normal range of motion.     Cervical back: Normal range of motion and neck supple.     Right lower leg: Edema (3+) present.     Left lower leg: Edema (3+) present.   Skin:    General:  Skin is warm and dry.     Findings: No erythema or rash.   Neurological:     Mental Status: He is alert and oriented to person, place, and time.     Cranial Nerves: No cranial nerve deficit.     Deep Tendon Reflexes: Reflexes are normal and symmetric.   Psychiatric:        Mood and Affect: Affect is inappropriate.        Behavior: Behavior is agitated and hyperactive.        Thought Content: Thought content normal.        Judgment: Judgment is impulsive and inappropriate.       BP 109/69   Pulse 99   Temp 98.1 F (36.7 C)   Ht 5' 10 (1.778 m)   Wt 154 lb (69.9 kg)   SpO2 95%   BMI 22.10 kg/m      Assessment & Plan:  Antonio Scott comes in today with chief complaint of Edema (BLE)   Diagnosis and orders addressed:  1. Peripheral edema (Primary) - Compression stockings - CMP14+EGFR - Brain natriuretic peptide - furosemide (LASIX) 20 MG tablet; Take 1 tablet (20 mg total) by mouth daily.  Dispense: 30 tablet; Refill: 0  2. Bipolar 1 disorder (HCC)  3. Noncompliance   Labs pending Encouraged to elevated feet Low salt diet Compression hose  PT is very noncompliant and has stopped all mood medications. States he does not want another pill, but will take a fluid pill. I have sent in a 30 day supple of lasix. Will need to keep follow up with PCP in 2 weeks to recheck BMP.  PT very sporadic in his actions and speech. Discussed in length these recommendations, however, I am unsure how much he will comply with.    Bari Learn, FNP

## 2023-07-30 NOTE — Patient Instructions (Signed)
 Peripheral Edema  Peripheral edema is swelling that is caused by a buildup of fluid. Peripheral edema most often affects the lower legs, ankles, and feet. It can also develop in the arms, hands, and face. The area of the body that has peripheral edema will look swollen. It may also feel heavy or warm. Your clothes may start to feel tight. Pressing on the area may make a temporary dent in your skin (pitting edema). You may not be able to move your swollen arm or leg as much as usual. There are many causes of peripheral edema. It can happen because of a complication of other conditions such as heart failure, kidney disease, or a problem with your circulation. It also can be a side effect of certain medicines or happen because of an infection. It often happens to women during pregnancy. Sometimes, the cause is not known. Follow these instructions at home: Managing pain, stiffness, and swelling  Raise (elevate) your legs while you are sitting or lying down. Move around often to prevent stiffness and to reduce swelling. Do not sit or stand for long periods of time. Do not wear tight clothing. Do not wear garters on your upper legs. Exercise your legs to get your circulation going. This helps to move the fluid back into your blood vessels, and it may help the swelling go down. Wear compression stockings as told by your health care provider. These stockings help to prevent blood clots and reduce swelling in your legs. It is important that these are the correct size. These stockings should be prescribed by your doctor to prevent possible injuries. If elastic bandages or wraps are recommended, use them as told by your health care provider. Medicines Take over-the-counter and prescription medicines only as told by your health care provider. Your health care provider may prescribe medicine to help your body get rid of excess water (diuretic). Take this medicine if you are told to take it. General  instructions Eat a low-salt (low-sodium) diet as told by your health care provider. Sometimes, eating less salt may reduce swelling. Pay attention to any changes in your symptoms. Moisturize your skin daily to help prevent skin from cracking and draining. Keep all follow-up visits. This is important. Contact a health care provider if: You have a fever. You have swelling in only one leg. You have increased swelling, redness, or pain in one or both of your legs. You have drainage or sores at the area where you have edema. Get help right away if: You have edema that starts suddenly or is getting worse, especially if you are pregnant or have a medical condition. You develop shortness of breath, especially when you are lying down. You have pain in your chest or abdomen. You feel weak. You feel like you will faint. These symptoms may be an emergency. Get help right away. Call 911. Do not wait to see if the symptoms will go away. Do not drive yourself to the hospital. Summary Peripheral edema is swelling that is caused by a buildup of fluid. Peripheral edema most often affects the lower legs, ankles, and feet. Move around often to prevent stiffness and to reduce swelling. Do not sit or stand for long periods of time. Pay attention to any changes in your symptoms. Contact a health care provider if you have edema that starts suddenly or is getting worse, especially if you are pregnant or have a medical condition. Get help right away if you develop shortness of breath, especially when lying down.  This information is not intended to replace advice given to you by your health care provider. Make sure you discuss any questions you have with your health care provider. Document Revised: 09/24/2020 Document Reviewed: 09/24/2020 Elsevier Patient Education  2024 [employer].

## 2023-07-31 ENCOUNTER — Ambulatory Visit: Payer: Self-pay | Admitting: Family

## 2023-07-31 LAB — CMP14+EGFR
ALT: 30 IU/L (ref 0–44)
AST: 29 IU/L (ref 0–40)
Albumin: 3.9 g/dL (ref 3.9–4.9)
Alkaline Phosphatase: 110 IU/L (ref 44–121)
BUN/Creatinine Ratio: 14 (ref 10–24)
BUN: 19 mg/dL (ref 8–27)
Bilirubin Total: 0.2 mg/dL (ref 0.0–1.2)
CO2: 24 mmol/L (ref 20–29)
Calcium: 9.5 mg/dL (ref 8.6–10.2)
Chloride: 107 mmol/L — ABNORMAL HIGH (ref 96–106)
Creatinine, Ser: 1.37 mg/dL — ABNORMAL HIGH (ref 0.76–1.27)
Globulin, Total: 2.3 g/dL (ref 1.5–4.5)
Glucose: 90 mg/dL (ref 70–99)
Potassium: 4.4 mmol/L (ref 3.5–5.2)
Sodium: 144 mmol/L (ref 134–144)
Total Protein: 6.2 g/dL (ref 6.0–8.5)
eGFR: 56 mL/min/{1.73_m2} — ABNORMAL LOW (ref >59)

## 2023-07-31 LAB — BRAIN NATRIURETIC PEPTIDE: BNP: 109.4 pg/mL — ABNORMAL HIGH (ref 0.0–100.0)

## 2023-08-04 NOTE — Telephone Encounter (Signed)
Pt has been seen in the office since this encounter

## 2023-08-13 ENCOUNTER — Ambulatory Visit: Admitting: Family Medicine

## 2023-08-13 ENCOUNTER — Ambulatory Visit (INDEPENDENT_AMBULATORY_CARE_PROVIDER_SITE_OTHER)

## 2023-08-13 ENCOUNTER — Encounter: Payer: Self-pay | Admitting: Family Medicine

## 2023-08-13 VITALS — BP 103/66 | HR 78 | Temp 97.7°F | Ht 70.0 in | Wt 146.8 lb

## 2023-08-13 DIAGNOSIS — R600 Localized edema: Secondary | ICD-10-CM

## 2023-08-13 DIAGNOSIS — E519 Thiamine deficiency, unspecified: Secondary | ICD-10-CM | POA: Diagnosis not present

## 2023-08-13 DIAGNOSIS — Z9089 Acquired absence of other organs: Secondary | ICD-10-CM | POA: Diagnosis not present

## 2023-08-13 DIAGNOSIS — M7989 Other specified soft tissue disorders: Secondary | ICD-10-CM | POA: Diagnosis not present

## 2023-08-13 DIAGNOSIS — E039 Hypothyroidism, unspecified: Secondary | ICD-10-CM

## 2023-08-13 DIAGNOSIS — E538 Deficiency of other specified B group vitamins: Secondary | ICD-10-CM | POA: Diagnosis not present

## 2023-08-13 DIAGNOSIS — I700 Atherosclerosis of aorta: Secondary | ICD-10-CM | POA: Diagnosis not present

## 2023-08-13 MED ORDER — FUROSEMIDE 20 MG PO TABS: 20.0000 mg | ORAL_TABLET | Freq: Every day | ORAL | 0 refills | Status: DC

## 2023-08-13 NOTE — Progress Notes (Signed)
 Subjective:  Patient ID: Antonio Scott, male    DOB: Nov 03, 1955, 68 y.o.   MRN: [id number]  Patient Care Team: Severa Rock HERO, FNP as PCP - General (Family Medicine) Cindie Carlin POUR, DO as Consulting Physician (Gastroenterology) Rogers Hai, MD as Medical Oncologist (Medical Oncology) Celestia Joesph SQUIBB, RN as Oncology Nurse Navigator (Medical Oncology)   Chief Complaint:  Edema (Lower legs- 2 week follow up)   HPI: Antonio Scott is a 68 y.o. male presenting on 08/13/2023 for Edema (Lower legs- 2 week follow up)   The patient is a 68 year old who presents with leg swelling.  They have been experiencing leg swelling and have not been taking Lasix  as prescribed due to being unaware of the prescription. They have not been wearing compression socks. The swelling is not associated with significant shortness of breath, although they experience some shortness of breath with exertion.  They have a history of lung problems, primarily characterized by expectoration of phlegm. No significant shortness of breath at rest is reported.  They are currently taking levothyroxine  and a baby aspirin  daily. They sometimes skip their thyroid  medication if it is too early in the morning but generally take it first thing. They also take a multivitamin and plan to switch to a 'men plus' formula soon.  Family history is notable for diabetes in their mother and possibly their aunt. Their mother and brother both died on Thanksgiving Day, and their mother's sister died nine days before their mother.  They have never been married and express a strong memory, although they note some decline in short-term memory.          Relevant past medical, surgical, family, and social history reviewed and updated as indicated.  Allergies and medications reviewed and updated. Data reviewed: Chart in Epic.   Past Medical History:  Diagnosis Date   Anxiety    Cancer [facility])    History of external beam  radiation therapy    Mediastinum- 08/01/21-09/11/21-Dr. Lynwood Nasuti   Hypothyroidism    Thymoma     Past Surgical History:  Procedure Laterality Date   INTERCOSTAL NERVE BLOCK Right 06/17/2021   Procedure: INTERCOSTAL NERVE BLOCK;  Surgeon: Shyrl Linnie KIDD, MD;  Location: [facility] OR;  Service: Thoracic;  Laterality: Right;   SKIN LESION EXCISION      Social History   Socioeconomic History   Marital status: Single    Spouse name: Not on file   Number of children: 0   Years of education: Not on file   Highest education level: Not on file  Occupational History   Occupation: retired  Tobacco Use   Smoking status: Every Day    Current packs/day: 1.00    Average packs/day: 0.6 packs/day for 105.5 years (65.5 ttl pk-yrs)    Types: Cigarettes    Start date: 1970   Smokeless tobacco: Never  Vaping Use   Vaping status: Never Used  Substance and Sexual Activity   Alcohol use: Not Currently   Drug use: Not Currently   Sexual activity: Not Currently  Other Topics Concern   Not on file  Social History Narrative   No children   Brother lives a mile away   Social Drivers of Health   Financial Resource Strain: Low Risk  (05/19/2023)   Overall Financial Resource Strain (CARDIA)    Difficulty of Paying Living Expenses: Not hard at all  Food Insecurity: No Food Insecurity (05/19/2023)   Hunger Vital Sign    Worried About  Running Out of Food in the Last Year: Never true    Ran Out of Food in the Last Year: Never true  Transportation Needs: No Transportation Needs (05/19/2023)   PRAPARE - Administrator, Civil Service (Medical): No    Lack of Transportation (Non-Medical): No  Physical Activity: Insufficiently Active (05/19/2023)   Exercise Vital Sign    Days of Exercise per Week: 3 days    Minutes of Exercise per Session: 30 min  Stress: No Stress Concern Present (05/19/2023)   [employer] of Occupational Health - Occupational Stress Questionnaire    Feeling of  Stress : Not at all  Social Connections: Socially Isolated (05/19/2023)   Social Connection and Isolation Panel    Frequency of Communication with Friends and Family: Once a week    Frequency of Social Gatherings with Friends and Family: Once a week    Attends Religious Services: Never    Database administrator or Organizations: No    Attends Banker Meetings: Never    Marital Status: Never married  Intimate Partner Violence: Not At Risk (05/19/2023)   Humiliation, Afraid, Rape, and Kick questionnaire    Fear of Current or Ex-Partner: No    Emotionally Abused: No    Physically Abused: No    Sexually Abused: No    Outpatient Encounter Medications as of 08/13/2023  Medication Sig   aspirin  EC 81 MG tablet Take 1 tablet (81 mg total) by mouth daily. Swallow whole.   levothyroxine  (SYNTHROID ) 50 MCG tablet Take 1 tablet (50 mcg total) by mouth daily.   Multiple Vitamins-Minerals (CENTRUM SILVER 50+MEN PO) Take 1 Dose by mouth daily.   thiamine  (VITAMIN B1) 100 MG tablet Take 100 mg by mouth daily.   furosemide  (LASIX ) 20 MG tablet Take 1 tablet (20 mg total) by mouth daily.   [DISCONTINUED] furosemide  (LASIX ) 20 MG tablet Take 1 tablet (20 mg total) by mouth daily. (Patient not taking: Reported on 08/13/2023)   No facility-administered encounter medications on file as of 08/13/2023.    No Known Allergies  Pertinent ROS per HPI, otherwise unremarkable      Objective:  BP 103/66   Pulse 78   Temp 97.7 F (36.5 C)   Ht 5' 10 (1.778 m)   Wt 146 lb 12.8 oz (66.6 kg)   SpO2 99%   BMI 21.06 kg/m    Wt Readings from Last 3 Encounters:  08/13/23 146 lb 12.8 oz (66.6 kg)  07/30/23 154 lb (69.9 kg)  07/08/23 142 lb 6.7 oz (64.6 kg)    Physical Exam Vitals and nursing note reviewed.  Constitutional:      General: He is not in acute distress.    Appearance: He is not ill-appearing, toxic-appearing or diaphoretic.  HENT:     Head: Normocephalic and atraumatic.   Cardiovascular:     Rate and Rhythm: Normal rate and regular rhythm.     Pulses: Normal pulses.     Heart sounds: Normal heart sounds.  Pulmonary:     Effort: Pulmonary effort is normal.     Breath sounds: Normal breath sounds.  Musculoskeletal:     Right lower leg: 2+ Pitting Edema present.     Left lower leg: 2+ Pitting Edema present.  Skin:    General: Skin is warm and dry.  Neurological:     Mental Status: He is alert.  Psychiatric:        Attention and Perception: He is inattentive.  Mood and Affect: Affect is inappropriate.        Speech: Speech is rapid and pressured and tangential.        Behavior: Behavior is hyperactive.      Results for orders placed or performed in visit on 07/30/23  CMP14+EGFR   Collection Time: 07/30/23  3:35 PM  Result Value Ref Range   Glucose 90 70 - 99 mg/dL   BUN 19 8 - 27 mg/dL   Creatinine, Ser 8.62 (H) 0.76 - 1.27 mg/dL   eGFR 56 (L) >40 fO/fpw/8.26   BUN/Creatinine Ratio 14 10 - 24   Sodium 144 134 - 144 mmol/L   Potassium 4.4 3.5 - 5.2 mmol/L   Chloride 107 (H) 96 - 106 mmol/L   CO2 24 20 - 29 mmol/L   Calcium 9.5 8.6 - 10.2 mg/dL   Total Protein 6.2 6.0 - 8.5 g/dL   Albumin 3.9 3.9 - 4.9 g/dL   Globulin, Total 2.3 1.5 - 4.5 g/dL   Bilirubin Total 0.2 0.0 - 1.2 mg/dL   Alkaline Phosphatase 110 44 - 121 IU/L   AST 29 0 - 40 IU/L   ALT 30 0 - 44 IU/L  Brain natriuretic peptide   Collection Time: 07/30/23  3:35 PM  Result Value Ref Range   BNP 109.4 (H) 0.0 - 100.0 pg/mL       Pertinent labs & imaging results that were available during my care of the patient were reviewed by me and considered in my medical decision making.  Assessment & Plan:  Johanna was seen today for edema.  Diagnoses and all orders for this visit:  Bilateral leg edema -     BMP8+EGFR -     Brain natriuretic peptide -     furosemide  (LASIX ) 20 MG tablet; Take 1 tablet (20 mg total) by mouth daily. -     DG Chest 2 View; Future -     CBC with  Differential/Platelet -     Thyroid  Panel With TSH  S/P thymectomy -     Thyroid  Panel With TSH -     Vitamin B12 -     Vitamin B1  Thiamin deficiency -     Vitamin B1  Vitamin B 12 deficiency -     Vitamin B12  Acquired hypothyroidism -     Thyroid  Panel With TSH     Leg swelling Chronic leg swelling likely due to fluid retention. Non-adherence to prescribed Lasix  due to unawareness. Occasional exertional dyspnea noted. - Prescribe Lasix  for two weeks - Order chest x-ray to evaluate for heart dilation or fluid accumulation - Schedule follow-up appointment in one to two weeks to reassess leg swelling  Hypothyroidism On levothyroxine  for hypothyroidism with occasional missed doses due to timing issues. Emphasized importance of daily adherence to maintain thyroid  function. - Continue levothyroxine  as prescribed - Emphasize importance of daily adherence to medication regimen  General Health Maintenance Plans to switch to 'men plus' multivitamin. - Encourage continuation of multivitamin - Advise on switching to 'men plus' multivitamin as planned       Continue all other maintenance medications.  Follow up plan: Return for 1-2 weeks for leg swelling.   Continue healthy lifestyle choices, including diet (rich in fruits, vegetables, and lean proteins, and low in salt and simple carbohydrates) and exercise (at least 30 minutes of moderate physical activity daily).  Educational handout given for edema  The above assessment and management plan was discussed with the patient. The patient verbalized  understanding of and has agreed to the management plan. Patient is aware to call the clinic if they develop any new symptoms or if symptoms persist or worsen. Patient is aware when to return to the clinic for a follow-up visit. Patient educated on when it is appropriate to go to the emergency department.   Rosaline Bruns, FNP-C [facility] [phone]

## 2023-08-14 ENCOUNTER — Other Ambulatory Visit (HOSPITAL_COMMUNITY): Payer: Self-pay | Admitting: Nephrology

## 2023-08-14 ENCOUNTER — Ambulatory Visit: Payer: Self-pay | Admitting: Family Medicine

## 2023-08-14 DIAGNOSIS — E538 Deficiency of other specified B group vitamins: Secondary | ICD-10-CM | POA: Diagnosis not present

## 2023-08-14 DIAGNOSIS — N1832 Chronic kidney disease, stage 3b: Secondary | ICD-10-CM | POA: Diagnosis not present

## 2023-08-14 DIAGNOSIS — E559 Vitamin D deficiency, unspecified: Secondary | ICD-10-CM | POA: Diagnosis not present

## 2023-08-14 DIAGNOSIS — R351 Nocturia: Secondary | ICD-10-CM

## 2023-08-14 DIAGNOSIS — I7143 Infrarenal abdominal aortic aneurysm, without rupture: Secondary | ICD-10-CM | POA: Diagnosis not present

## 2023-08-16 LAB — CBC WITH DIFFERENTIAL/PLATELET
Basophils Absolute: 0.1 x10E3/uL (ref 0.0–0.2)
Basos: 2 %
EOS (ABSOLUTE): 0.3 x10E3/uL (ref 0.0–0.4)
Eos: 5 %
Hematocrit: 42 % (ref 37.5–51.0)
Hemoglobin: 13.6 g/dL (ref 13.0–17.7)
Immature Grans (Abs): 0 x10E3/uL (ref 0.0–0.1)
Immature Granulocytes: 0 %
Lymphocytes Absolute: 0.8 x10E3/uL (ref 0.7–3.1)
Lymphs: 16 %
MCH: 31.3 pg (ref 26.6–33.0)
MCHC: 32.4 g/dL (ref 31.5–35.7)
MCV: 97 fL (ref 79–97)
Monocytes Absolute: 0.5 x10E3/uL (ref 0.1–0.9)
Monocytes: 9 %
Neutrophils Absolute: 3.4 x10E3/uL (ref 1.4–7.0)
Neutrophils: 68 %
Platelets: 202 x10E3/uL (ref 150–450)
RBC: 4.34 x10E6/uL (ref 4.14–5.80)
RDW: 12.9 % (ref 11.6–15.4)
WBC: 5 x10E3/uL (ref 3.4–10.8)

## 2023-08-16 LAB — BMP8+EGFR
BUN/Creatinine Ratio: 13 (ref 10–24)
BUN: 18 mg/dL (ref 8–27)
CO2: 22 mmol/L (ref 20–29)
Calcium: 9.8 mg/dL (ref 8.6–10.2)
Chloride: 102 mmol/L (ref 96–106)
Creatinine, Ser: 1.39 mg/dL — ABNORMAL HIGH (ref 0.76–1.27)
Glucose: 78 mg/dL (ref 70–99)
Potassium: 4.6 mmol/L (ref 3.5–5.2)
Sodium: 140 mmol/L (ref 134–144)
eGFR: 55 mL/min/1.73 — ABNORMAL LOW (ref >59)

## 2023-08-16 LAB — THYROID PANEL WITH TSH
Free Thyroxine Index: 2.1 (ref 1.2–4.9)
T3 Uptake Ratio: 31 % (ref 24–39)
T4, Total: 6.8 ug/dL (ref 4.5–12.0)
TSH: 10.3 u[IU]/mL — ABNORMAL HIGH (ref 0.450–4.500)

## 2023-08-16 LAB — BRAIN NATRIURETIC PEPTIDE: BNP: 64.8 pg/mL (ref 0.0–100.0)

## 2023-08-16 LAB — VITAMIN B12: Vitamin B-12: 416 pg/mL (ref 232–1245)

## 2023-08-16 LAB — VITAMIN B1: Thiamine: 155.8 nmol/L (ref 66.5–200.0)

## 2023-08-27 ENCOUNTER — Encounter: Payer: Self-pay | Admitting: Family Medicine

## 2023-08-27 ENCOUNTER — Ambulatory Visit (INDEPENDENT_AMBULATORY_CARE_PROVIDER_SITE_OTHER): Admitting: Family Medicine

## 2023-08-27 VITALS — BP 104/77 | Temp 97.1°F | Ht 70.0 in | Wt 144.4 lb

## 2023-08-27 DIAGNOSIS — N289 Disorder of kidney and ureter, unspecified: Secondary | ICD-10-CM

## 2023-08-27 DIAGNOSIS — R600 Localized edema: Secondary | ICD-10-CM

## 2023-08-27 NOTE — Progress Notes (Signed)
 Subjective:  Patient ID: Antonio Scott, male    DOB: 04/21/1955, 68 y.o.   MRN: [id number]  Patient Care Team: Severa Rock HERO, FNP as PCP - General (Family Medicine) Cindie Carlin POUR, DO as Consulting Physician (Gastroenterology) Rogers Hai, MD as Medical Oncologist (Medical Oncology) Celestia Joesph SQUIBB, RN as Oncology Nurse Navigator (Medical Oncology)   Chief Complaint:  Leg Swelling (2 week follow up )   HPI: Antonio Scott is a 68 y.o. male presenting on 08/27/2023 for Leg Swelling (2 week follow up )   Antonio Scott is a 68 year old male who presents for follow-up of leg swelling and Lasix  use.  He reports significant improvement in leg swelling and confirms that he has been wearing his compression socks. He confirms adherence to the prescribed Lasix  regimen, taking 20 mg daily.  He notes frequent urination, which is expected with Lasix  use. He was initially provided with a ten-day supply and estimates having more than a couple of pills remaining.  No chest pain, shortness of breath, or palpitations.        Relevant past medical, surgical, family, and social history reviewed and updated as indicated.  Allergies and medications reviewed and updated. Data reviewed: Chart in Epic.   Past Medical History:  Diagnosis Date   Anxiety    Cancer [facility])    History of external beam radiation therapy    Mediastinum- 08/01/21-09/11/21-Dr. Lynwood Nasuti   Hypothyroidism    Thymoma     Past Surgical History:  Procedure Laterality Date   INTERCOSTAL NERVE BLOCK Right 06/17/2021   Procedure: INTERCOSTAL NERVE BLOCK;  Surgeon: Shyrl Linnie KIDD, MD;  Location: [facility] OR;  Service: Thoracic;  Laterality: Right;   SKIN LESION EXCISION      Social History   Socioeconomic History   Marital status: Single    Spouse name: Not on file   Number of children: 0   Years of education: Not on file   Highest education level: Not on file  Occupational History   Occupation:  retired  Tobacco Use   Smoking status: Every Day    Current packs/day: 1.00    Average packs/day: 0.6 packs/day for 105.6 years (65.6 ttl pk-yrs)    Types: Cigarettes    Start date: 1970   Smokeless tobacco: Never  Vaping Use   Vaping status: Never Used  Substance and Sexual Activity   Alcohol use: Not Currently   Drug use: Not Currently   Sexual activity: Not Currently  Other Topics Concern   Not on file  Social History Narrative   No children   Brother lives a mile away   Social Drivers of Health   Financial Resource Strain: Low Risk  (05/19/2023)   Overall Financial Resource Strain (CARDIA)    Difficulty of Paying Living Expenses: Not hard at all  Food Insecurity: No Food Insecurity (05/19/2023)   Hunger Vital Sign    Worried About Running Out of Food in the Last Year: Never true    Ran Out of Food in the Last Year: Never true  Transportation Needs: No Transportation Needs (05/19/2023)   PRAPARE - Administrator, Civil Service (Medical): No    Lack of Transportation (Non-Medical): No  Physical Activity: Insufficiently Active (05/19/2023)   Exercise Vital Sign    Days of Exercise per Week: 3 days    Minutes of Exercise per Session: 30 min  Stress: No Stress Concern Present (05/19/2023)   [employer] of  Occupational Health - Occupational Stress Questionnaire    Feeling of Stress : Not at all  Social Connections: Socially Isolated (05/19/2023)   Social Connection and Isolation Panel    Frequency of Communication with Friends and Family: Once a week    Frequency of Social Gatherings with Friends and Family: Once a week    Attends Religious Services: Never    Database administrator or Organizations: No    Attends Banker Meetings: Never    Marital Status: Never married  Intimate Partner Violence: Not At Risk (05/19/2023)   Humiliation, Afraid, Rape, and Kick questionnaire    Fear of Current or Ex-Partner: No    Emotionally Abused: No     Physically Abused: No    Sexually Abused: No    Outpatient Encounter Medications as of 08/27/2023  Medication Sig   aspirin  EC 81 MG tablet Take 1 tablet (81 mg total) by mouth daily. Swallow whole.   furosemide  (LASIX ) 20 MG tablet Take 1 tablet (20 mg total) by mouth daily.   levothyroxine  (SYNTHROID ) 50 MCG tablet Take 1 tablet (50 mcg total) by mouth daily.   Multiple Vitamins-Minerals (CENTRUM SILVER 50+MEN PO) Take 1 Dose by mouth daily.   thiamine  (VITAMIN B1) 100 MG tablet Take 100 mg by mouth daily.   No facility-administered encounter medications on file as of 08/27/2023.    No Known Allergies  Pertinent ROS per HPI, otherwise unremarkable      Objective:  BP 104/77   Temp (!) 97.1 F (36.2 C)   Ht 5' 10 (1.778 m)   Wt 144 lb 6.4 oz (65.5 kg)   SpO2 96%   BMI 20.72 kg/m    Wt Readings from Last 3 Encounters:  08/27/23 144 lb 6.4 oz (65.5 kg)  08/13/23 146 lb 12.8 oz (66.6 kg)  07/30/23 154 lb (69.9 kg)    Physical Exam Vitals and nursing note reviewed.  Constitutional:      General: He is not in acute distress.    Appearance: Normal appearance. He is normal weight. He is not ill-appearing, toxic-appearing or diaphoretic.  HENT:     Head: Normocephalic and atraumatic.     Nose: Nose normal.     Mouth/Throat:     Mouth: Mucous membranes are moist.     Pharynx: Oropharynx is clear.  Eyes:     Conjunctiva/sclera: Conjunctivae normal.     Pupils: Pupils are equal, round, and reactive to light.  Cardiovascular:     Rate and Rhythm: Normal rate and regular rhythm.     Heart sounds: Normal heart sounds.  Pulmonary:     Effort: Pulmonary effort is normal.     Breath sounds: Normal breath sounds.  Musculoskeletal:     Right lower leg: No edema.     Left lower leg: No edema.  Skin:    General: Skin is warm and dry.     Capillary Refill: Capillary refill takes less than 2 seconds.  Neurological:     General: No focal deficit present.     Mental Status: He  is alert and oriented to person, place, and time.  Psychiatric:        Mood and Affect: Mood normal.        Behavior: Behavior normal.        Thought Content: Thought content normal.        Judgment: Judgment normal.    Results for orders placed or performed in visit on 08/13/23  [facility]  Collection Time: 08/13/23 10:42 AM  Result Value Ref Range   Glucose 78 70 - 99 mg/dL   BUN 18 8 - 27 mg/dL   Creatinine, Ser 8.60 (H) 0.76 - 1.27 mg/dL   eGFR 55 (L) >40 fO/fpw/8.26   BUN/Creatinine Ratio 13 10 - 24   Sodium 140 134 - 144 mmol/L   Potassium 4.6 3.5 - 5.2 mmol/L   Chloride 102 96 - 106 mmol/L   CO2 22 20 - 29 mmol/L   Calcium 9.8 8.6 - 10.2 mg/dL  Brain natriuretic peptide   Collection Time: 08/13/23 10:42 AM  Result Value Ref Range   BNP 64.8 0.0 - 100.0 pg/mL  CBC with Differential/Platelet   Collection Time: 08/13/23 10:42 AM  Result Value Ref Range   WBC 5.0 3.4 - 10.8 x10E3/uL   RBC 4.34 4.14 - 5.80 x10E6/uL   Hemoglobin 13.6 13.0 - 17.7 g/dL   Hematocrit 57.9 62.4 - 51.0 %   MCV 97 79 - 97 fL   MCH 31.3 26.6 - 33.0 pg   MCHC 32.4 31.5 - 35.7 g/dL   RDW 87.0 88.3 - 84.5 %   Platelets 202 150 - 450 x10E3/uL   Neutrophils 68 Not Estab. %   Lymphs 16 Not Estab. %   Monocytes 9 Not Estab. %   Eos 5 Not Estab. %   Basos 2 Not Estab. %   Neutrophils Absolute 3.4 1.4 - 7.0 x10E3/uL   Lymphocytes Absolute 0.8 0.7 - 3.1 x10E3/uL   Monocytes Absolute 0.5 0.1 - 0.9 x10E3/uL   EOS (ABSOLUTE) 0.3 0.0 - 0.4 x10E3/uL   Basophils Absolute 0.1 0.0 - 0.2 x10E3/uL   Immature Granulocytes 0 Not Estab. %   Immature Grans (Abs) 0.0 0.0 - 0.1 x10E3/uL  Thyroid  Panel With TSH   Collection Time: 08/13/23 10:42 AM  Result Value Ref Range   TSH 10.300 (H) 0.450 - 4.500 uIU/mL   T4, Total 6.8 4.5 - 12.0 ug/dL   T3 Uptake Ratio 31 24 - 39 %   Free Thyroxine Index 2.1 1.2 - 4.9  Vitamin B12   Collection Time: 08/13/23 10:42 AM  Result Value Ref Range   Vitamin B-12 416 232 -  1,245 pg/mL  Vitamin B1   Collection Time: 08/13/23 10:42 AM  Result Value Ref Range   Thiamine  155.8 66.5 - 200.0 nmol/L       Pertinent labs & imaging results that were available during my care of the patient were reviewed by me and considered in my medical decision making.  Assessment & Plan:  Antonio Scott was seen today for leg swelling.  Diagnoses and all orders for this visit:  Bilateral leg edema -     BMP8+EGFR  Renal function impairment -     BMP8+EGFR   Peripheral Edema Presented for follow-up regarding leg swelling, which has improved significantly with compression socks and Lasix  (furosemide ). Reports adherence to the prescribed 20 mg daily dose of Lasix  with frequent urination, indicating effective diuresis. No associated symptoms such as chest pain, shortness of breath, or palpitations, suggesting absence of acute cardiac issues. Initially given a 10-day supply of Lasix  with some pills remaining, indicating partial completion of the course. - Complete the remaining course of Lasix  as prescribed. - Order BMP to assess kidney function in response to diuretic therapy.          Continue all other maintenance medications.  Follow up plan: Return if symptoms worsen or fail to improve.   Continue healthy lifestyle choices, including  diet (rich in fruits, vegetables, and lean proteins, and low in salt and simple carbohydrates) and exercise (at least 30 minutes of moderate physical activity daily).  Educational handout given for edema  The above assessment and management plan was discussed with the patient. The patient verbalized understanding of and has agreed to the management plan. Patient is aware to call the clinic if they develop any new symptoms or if symptoms persist or worsen. Patient is aware when to return to the clinic for a follow-up visit. Patient educated on when it is appropriate to go to the emergency department.   Rosaline Bruns, FNP-C [facility] [phone]

## 2023-08-28 ENCOUNTER — Ambulatory Visit: Payer: Self-pay | Admitting: Family Medicine

## 2023-08-28 ENCOUNTER — Other Ambulatory Visit: Payer: Self-pay | Admitting: Family Medicine

## 2023-08-28 DIAGNOSIS — N289 Disorder of kidney and ureter, unspecified: Secondary | ICD-10-CM

## 2023-08-28 LAB — BMP8+EGFR
BUN/Creatinine Ratio: 10 (ref 10–24)
BUN: 15 mg/dL (ref 8–27)
CO2: 23 mmol/L (ref 20–29)
Calcium: 9.9 mg/dL (ref 8.6–10.2)
Chloride: 99 mmol/L (ref 96–106)
Creatinine, Ser: 1.56 mg/dL — ABNORMAL HIGH (ref 0.76–1.27)
Glucose: 95 mg/dL (ref 70–99)
Potassium: 4.6 mmol/L (ref 3.5–5.2)
Sodium: 140 mmol/L (ref 134–144)
eGFR: 48 mL/min/1.73 — ABNORMAL LOW (ref >59)

## 2023-09-01 NOTE — Progress Notes (Signed)
 This encounter was created in error - please disregard.

## 2023-09-09 ENCOUNTER — Other Ambulatory Visit: Payer: Self-pay | Admitting: Family Medicine

## 2023-09-09 DIAGNOSIS — R600 Localized edema: Secondary | ICD-10-CM

## 2023-09-11 ENCOUNTER — Other Ambulatory Visit

## 2023-09-11 DIAGNOSIS — N289 Disorder of kidney and ureter, unspecified: Secondary | ICD-10-CM | POA: Diagnosis not present

## 2023-09-12 LAB — BMP8+EGFR
BUN/Creatinine Ratio: 9 — ABNORMAL LOW (ref 10–24)
BUN: 12 mg/dL (ref 8–27)
CO2: 22 mmol/L (ref 20–29)
Calcium: 10 mg/dL (ref 8.6–10.2)
Chloride: 102 mmol/L (ref 96–106)
Creatinine, Ser: 1.4 mg/dL — ABNORMAL HIGH (ref 0.76–1.27)
Glucose: 104 mg/dL — ABNORMAL HIGH (ref 70–99)
Potassium: 4.3 mmol/L (ref 3.5–5.2)
Sodium: 139 mmol/L (ref 134–144)
eGFR: 54 mL/min/1.73 — ABNORMAL LOW (ref >59)

## 2023-09-13 ENCOUNTER — Ambulatory Visit: Payer: Self-pay | Admitting: Family Medicine

## 2023-09-14 NOTE — Telephone Encounter (Signed)
 Patient aware and verbalized understanding. Patient reports he has appointment with Dr Rachele in October.

## 2023-10-15 IMAGING — CT NM PET TUM IMG INITIAL (PI) SKULL BASE T - THIGH
1 of 7 series · 1 of 25 positions shown · non-contrast
Comparison: Chest CT 02/11/2021.  MRI 03/06/2021.

CLINICAL DATA: Initial treatment strategy for anterior mediastinal
mass on CT and MRI.

EXAM:
NUCLEAR MEDICINE PET SKULL BASE TO THIGH
TECHNIQUE: 9.4 mCi F-18 FDG was injected intravenously. Full-ring PET imaging
was performed from the skull base to thigh after the radiotracer. CT
data was obtained and used for attenuation correction and anatomic
localization.
Fasting blood glucose: 83 mg/dl

[Series 3: ctac · axial · 3.0mm · 0.98mm/px · 1 of 293 slices shown]
[im 293/293  brain]
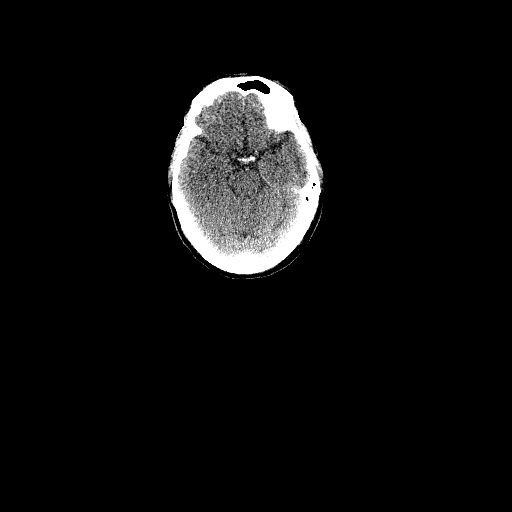

[1 of 25 positions shown; findings below may reference images not displayed]

FINDINGS: Mediastinal blood pool activity: SUV max

Liver activity: SUV max NA

NECK: Hypermetabolism anterior to the larynx could be muscular or
related to an atypical location of a node in this region, including
at 6 mm and a S.U.V. max of 3.9.

The thyroid is diffusely hypermetabolic, without dominant mass.
Example at a S.U.V. max of 9.4.

Incidental CT findings: No cervical adenopathy.

CHEST: The macrolobulated solid anterior mediastinal mass is
hypermetabolic. Example at 3.6 x 2.6 cm and a S.U.V. max of 9.4 on
102/3. Similar in size to on the prior CT.

No other thoracic hypermetabolism identified.

Incidental CT findings: Aortic and lad coronary artery
calcification. Mild centrilobular and paraseptal emphysema.

ABDOMEN/PELVIS: No abdominopelvic parenchymal or nodal
hypermetabolism.

Incidental CT findings: Mild left adrenal thickening. Right adrenal
calcification likely relates to remote hemorrhage or infection. No
abdominopelvic adenopathy. Infrarenal abdominal aortic dilatation
including at 3.7 x 3.1 cm on 184/3. No extension into the iliacs.
Mild prostatomegaly.

SKELETON: No abnormal marrow activity.

Incidental CT findings: none
IMPRESSION: 1. Moderate hypermetabolism corresponding to the anterior
mediastinal mass. Differential considerations include thymic
neoplasm or lymphoma. Recommend tissue sampling.
2. Diffuse thyroid hypermetabolism can be seen with thyroiditis.
Consider correlation with thyroid function tests.
3. Muscular activity versus an atypically positioned mildly
hypermetabolic node within the fat anterior to the larynx.
Nonspecific.
4. Infrarenal abdominal aortic dilatation at maximally 3.6 cm.
Recommend follow-up ultrasound every 2 years. This recommendation
follows ACR consensus guidelines: White Paper of the ACR Incidental
Findings Committee II on Vascular Findings. [HOSPITAL] 0197;
5. Aortic atherosclerosis (IQ0CT-8TJ.J), coronary artery
atherosclerosis and emphysema (IQ0CT-HNT.V).

## 2023-10-27 DIAGNOSIS — Z008 Encounter for other general examination: Secondary | ICD-10-CM | POA: Diagnosis not present

## 2023-11-04 ENCOUNTER — Inpatient Hospital Stay: Attending: Oncology

## 2023-11-04 ENCOUNTER — Ambulatory Visit (HOSPITAL_COMMUNITY)
Admission: RE | Admit: 2023-11-04 | Discharge: 2023-11-04 | Disposition: A | Discharge status: Home / Self Care | Source: Ambulatory Visit | Attending: Hematology | Admitting: Hematology

## 2023-11-04 DIAGNOSIS — Z802 Family history of malignant neoplasm of other respiratory and intrathoracic organs: Secondary | ICD-10-CM | POA: Insufficient documentation

## 2023-11-04 DIAGNOSIS — D508 Other iron deficiency anemias: Secondary | ICD-10-CM | POA: Insufficient documentation

## 2023-11-04 DIAGNOSIS — D4989 Neoplasm of unspecified behavior of other specified sites: Secondary | ICD-10-CM

## 2023-11-04 DIAGNOSIS — J479 Bronchiectasis, uncomplicated: Secondary | ICD-10-CM | POA: Diagnosis not present

## 2023-11-04 DIAGNOSIS — Z808 Family history of malignant neoplasm of other organs or systems: Secondary | ICD-10-CM | POA: Insufficient documentation

## 2023-11-04 DIAGNOSIS — I700 Atherosclerosis of aorta: Secondary | ICD-10-CM | POA: Insufficient documentation

## 2023-11-04 DIAGNOSIS — Z923 Personal history of irradiation: Secondary | ICD-10-CM | POA: Insufficient documentation

## 2023-11-04 DIAGNOSIS — J439 Emphysema, unspecified: Secondary | ICD-10-CM | POA: Diagnosis not present

## 2023-11-04 DIAGNOSIS — Z800 Family history of malignant neoplasm of digestive organs: Secondary | ICD-10-CM | POA: Insufficient documentation

## 2023-11-04 DIAGNOSIS — F17210 Nicotine dependence, cigarettes, uncomplicated: Secondary | ICD-10-CM | POA: Diagnosis not present

## 2023-11-04 DIAGNOSIS — Z85118 Personal history of other malignant neoplasm of bronchus and lung: Secondary | ICD-10-CM | POA: Diagnosis not present

## 2023-11-04 DIAGNOSIS — C3490 Malignant neoplasm of unspecified part of unspecified bronchus or lung: Secondary | ICD-10-CM | POA: Diagnosis not present

## 2023-11-04 DIAGNOSIS — Z803 Family history of malignant neoplasm of breast: Secondary | ICD-10-CM | POA: Diagnosis not present

## 2023-11-04 DIAGNOSIS — C37 Malignant neoplasm of thymus: Secondary | ICD-10-CM | POA: Insufficient documentation

## 2023-11-04 DIAGNOSIS — R918 Other nonspecific abnormal finding of lung field: Secondary | ICD-10-CM | POA: Insufficient documentation

## 2023-11-04 LAB — IRON AND TIBC
Iron: 80 ug/dL (ref 45–182)
Saturation Ratios: 22 % (ref 17.9–39.5)
TIBC: 371 ug/dL (ref 250–450)
UIBC: 291 ug/dL

## 2023-11-04 LAB — CBC WITH DIFFERENTIAL/PLATELET
Abs Immature Granulocytes: 0.01 K/uL (ref 0.00–0.07)
Basophils Absolute: 0.1 K/uL (ref 0.0–0.1)
Basophils Relative: 1 %
Eosinophils Absolute: 0.2 K/uL (ref 0.0–0.5)
Eosinophils Relative: 4 %
HCT: 43.4 % (ref 39.0–52.0)
Hemoglobin: 14.4 g/dL (ref 13.0–17.0)
Immature Granulocytes: 0 %
Lymphocytes Relative: 10 %
Lymphs Abs: 0.6 K/uL — ABNORMAL LOW (ref 0.7–4.0)
MCH: 32.1 pg (ref 26.0–34.0)
MCHC: 33.2 g/dL (ref 30.0–36.0)
MCV: 96.7 fL (ref 80.0–100.0)
Monocytes Absolute: 0.5 K/uL (ref 0.1–1.0)
Monocytes Relative: 8 %
Neutro Abs: 4.5 K/uL (ref 1.7–7.7)
Neutrophils Relative %: 77 %
Platelets: 223 K/uL (ref 150–400)
RBC: 4.49 MIL/uL (ref 4.22–5.81)
RDW: 12.6 % (ref 11.5–15.5)
WBC: 5.8 K/uL (ref 4.0–10.5)
nRBC: 0 % (ref 0.0–0.2)

## 2023-11-04 LAB — COMPREHENSIVE METABOLIC PANEL WITH GFR
ALT: 17 U/L (ref 0–44)
AST: 23 U/L (ref 15–41)
Albumin: 4.5 g/dL (ref 3.5–5.0)
Alkaline Phosphatase: 108 U/L (ref 38–126)
Anion gap: 11 (ref 5–15)
BUN: 18 mg/dL (ref 8–23)
CO2: 27 mmol/L (ref 22–32)
Calcium: 10 mg/dL (ref 8.9–10.3)
Chloride: 102 mmol/L (ref 98–111)
Creatinine, Ser: 1.54 mg/dL — ABNORMAL HIGH (ref 0.61–1.24)
GFR, Estimated: 49 mL/min — ABNORMAL LOW (ref >60)
Glucose, Bld: 93 mg/dL (ref 70–99)
Potassium: 4.4 mmol/L (ref 3.5–5.1)
Sodium: 139 mmol/L (ref 135–145)
Total Bilirubin: 0.4 mg/dL (ref 0.0–1.2)
Total Protein: 7.5 g/dL (ref 6.5–8.1)

## 2023-11-04 LAB — FERRITIN: Ferritin: 73 ng/mL (ref 24–336)

## 2023-11-05 ENCOUNTER — Other Ambulatory Visit: Payer: Self-pay | Admitting: Family Medicine

## 2023-11-05 ENCOUNTER — Telehealth: Payer: Self-pay | Admitting: Family Medicine

## 2023-11-05 DIAGNOSIS — R600 Localized edema: Secondary | ICD-10-CM

## 2023-11-05 NOTE — Telephone Encounter (Signed)
  Prescription Request  11/05/2023  Is this a Controlled Substance medicine? no  Have you seen your PCP in the last 2 weeks? no  If YES, route message to pool  -  If NO, patient needs to be scheduled for appointment.  What is the name of the medication or equipment? Furosemide  20 mg, Levothyroxine  50 mcg  Have you contacted your pharmacy to request a refill? yes   Which pharmacy would you like this sent to? [facility]   Patient notified that their request is being sent to the clinical staff for review and that they should receive a response within 2 business days.

## 2023-11-06 NOTE — Telephone Encounter (Signed)
 Responded to in refill encounter. Provider refused stating that refill was not appropriate

## 2023-11-06 NOTE — Telephone Encounter (Signed)
Do you want him to continue the lasix?

## 2023-11-11 ENCOUNTER — Inpatient Hospital Stay: Admitting: Oncology

## 2023-11-11 ENCOUNTER — Ambulatory Visit: Admitting: Hematology

## 2023-11-18 ENCOUNTER — Ambulatory Visit (HOSPITAL_COMMUNITY)

## 2023-11-18 ENCOUNTER — Encounter (HOSPITAL_COMMUNITY): Payer: Self-pay

## 2023-11-19 ENCOUNTER — Inpatient Hospital Stay: Admitting: Oncology

## 2023-11-19 VITALS — BP 106/69 | HR 75 | Temp 97.7°F | Resp 16 | Wt 149.0 lb

## 2023-11-19 DIAGNOSIS — D4989 Neoplasm of unspecified behavior of other specified sites: Secondary | ICD-10-CM | POA: Diagnosis not present

## 2023-11-19 DIAGNOSIS — Z803 Family history of malignant neoplasm of breast: Secondary | ICD-10-CM | POA: Diagnosis not present

## 2023-11-19 DIAGNOSIS — N1832 Chronic kidney disease, stage 3b: Secondary | ICD-10-CM | POA: Diagnosis not present

## 2023-11-19 DIAGNOSIS — Z808 Family history of malignant neoplasm of other organs or systems: Secondary | ICD-10-CM | POA: Diagnosis not present

## 2023-11-19 DIAGNOSIS — R809 Proteinuria, unspecified: Secondary | ICD-10-CM | POA: Diagnosis not present

## 2023-11-19 DIAGNOSIS — F17210 Nicotine dependence, cigarettes, uncomplicated: Secondary | ICD-10-CM | POA: Diagnosis not present

## 2023-11-19 DIAGNOSIS — D508 Other iron deficiency anemias: Secondary | ICD-10-CM

## 2023-11-19 DIAGNOSIS — D631 Anemia in chronic kidney disease: Secondary | ICD-10-CM | POA: Diagnosis not present

## 2023-11-19 DIAGNOSIS — Z802 Family history of malignant neoplasm of other respiratory and intrathoracic organs: Secondary | ICD-10-CM | POA: Diagnosis not present

## 2023-11-19 DIAGNOSIS — Z800 Family history of malignant neoplasm of digestive organs: Secondary | ICD-10-CM | POA: Diagnosis not present

## 2023-11-19 DIAGNOSIS — C37 Malignant neoplasm of thymus: Secondary | ICD-10-CM | POA: Diagnosis not present

## 2023-11-19 DIAGNOSIS — Z923 Personal history of irradiation: Secondary | ICD-10-CM | POA: Diagnosis not present

## 2023-11-19 DIAGNOSIS — R918 Other nonspecific abnormal finding of lung field: Secondary | ICD-10-CM | POA: Diagnosis not present

## 2023-11-19 NOTE — Progress Notes (Signed)
 [facility] OFFICE PROGRESS NOTE  Rakes, Rock HERO, FNP  ASSESSMENT & PLAN:    Assessment & Plan Thymoma - He does not report any recent respiratory infections. - Labs from 11/04/2023 show a ferritin of 73 (89), creatinine 1.54 (1.40) with normal LFTs.  CBC was unremarkable.  He is not taking iron supplements.  He takes a multivitamin daily.   - CT chest (11/04/2023): Redemonstrated postradiation changes in the bilateral upper hemothorax, anteriorly.  There are patchy ground glass changes in the bilateral upper lobes right more than left which appear slightly more pronounced than the prior exam however remain nonspecific and also favored sequela of evolving postradiation changes.  However correlate clinically for superimposed pneumonitis.  No new suspicious lung nodules. -We discussed the findings and he does not have any shortness of breath or cough.  We discussed in detail calling clinic should he develop shortness of breath or coughing as we can further workup above CT scan. - RTC 6 months for follow-up with repeat CT chest without contrast. Other iron deficiency anemia Labs from 11/04/2023 show saturation 22% with a ferritin of 73.  TIBC is normal at 371.  Hemoglobin is 14.4 with a normal platelet count. He is currently taking a multivitamin with iron.  Tolerating well.  Orders Placed This Encounter  Procedures   CT CHEST WO CONTRAST    Standing Status:   Future    Expected Date:   05/20/2024    Expiration Date:   11/18/2024    Preferred imaging location?:   [facility]   Ferritin    Standing Status:   Future    Expected Date:   05/13/2024    Expiration Date:   11/18/2024   Iron and TIBC ([facility] ONLY)    Standing Status:   Future    Expected Date:   05/13/2024    Expiration Date:   11/18/2024   Comprehensive metabolic panel    Standing Status:   Future    Expected Date:   05/13/2024    Expiration Date:   11/18/2024   CBC with Differential     Standing Status:   Future    Expected Date:   05/13/2024    Expiration Date:   11/18/2024    INTERVAL HISTORY: Patient returns for follow-up for history of stage II glioma discovered during CT lung cancer screening on 02/11/2021.  status post thorascopic resection of anterior mediastinal mass on 06/17/2021.  He completed port.   Reports today he is doing well.  Reports 3 months worth of leg swelling he was treated with compression stockings and Lasix .  Swelling has resolved ran out of Lasix .  He does have follow-up with his PCP at the end of the month.  He was evaluated at [facility] ED on 07/08/2023 for psychosis presenting to [facility] [employer] threatening to kill people.  He was given Geodon  to help calm him.  Reports he was given several medications but he does not want to take them.  He has not had follow-up with behavioral health or psychiatry since this incident.  Reports he feels like he is doing a little bit better today although he still down.  We reviewed CBC, CMP, iron panel and ferritin.  SUMMARY OF HEMATOLOGIC HISTORY: Oncology History Overview Note  1. 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  No enlarged lymph  nodes. - MRI of the chest with and without contrast on 03/06/2021: Heterogeneous, lobulated solid mass in the anterior mediastinum with faint contrast-enhancement measuring 4.4 x 2.8 cm.  Small areas of cystic change internally.  No enlarged mediastinal, hilar or axillary lymph nodes. - PET scan on 03/07/2021: Macrolobulated solid anterior mediastinal mass is hypermetabolic with SUV 9.4.  Thyroid  is diffusely hypermetabolic without dominant mass with SUV 9.4.  Hypermetabolism anterior to the larynx could be muscular or related to atypical location of the node in this region including 6 mm SUV 3.9. - He was evaluated by Dr. Shyrl.  Tumor markers  show normal LDH and beta-hCG.  AFP was minimally high at 6.3 (less than 6.1). - No B symptoms. - Robotic assisted thoracoscopic resection of anterior mediastinal mass on 06/17/2021. - Pathology: 5.6 cm type B1 thymoma.  Margins negative for tumor.  Carcinoma appears to involve pericardium. - He completed PORT.   2. Social/family history: - He is a retired Animator. - Current active smoker, smoked 1 pack/day for 50 years, now smoking 2 cigarettes/day since last 1 month. - Brother died of thymic carcinoma.  Another brother had melanoma.  Maternal aunt had liver cancer.  Maternal grandmother had breast cancer.  Maternal grandfather had pancreatic cancer.  Paternal grandfather had colon cancer.    No history exists.     CBC    Component Value Date/Time   WBC 5.8 11/04/2023 1515   RBC 4.49 11/04/2023 1515   HGB 14.4 11/04/2023 1515   HGB 13.6 08/13/2023 1042   HCT 43.4 11/04/2023 1515   HCT 42.0 08/13/2023 1042   PLT 223 11/04/2023 1515   PLT 202 08/13/2023 1042   MCV 96.7 11/04/2023 1515   MCV 97 08/13/2023 1042   MCH 32.1 11/04/2023 1515   MCHC 33.2 11/04/2023 1515   RDW 12.6 11/04/2023 1515   RDW 12.9 08/13/2023 1042   LYMPHSABS 0.6 (L) 11/04/2023 1515   LYMPHSABS 0.8 08/13/2023 1042   MONOABS 0.5 11/04/2023 1515   EOSABS 0.2 11/04/2023 1515   EOSABS 0.3 08/13/2023 1042   BASOSABS 0.1 11/04/2023 1515   BASOSABS 0.1 08/13/2023 1042       Latest Ref Rng & Units 11/04/2023    3:15 PM 09/11/2023    9:17 AM 08/27/2023    3:01 PM  CMP  Glucose 70 - 99 mg/dL 93  895  95   BUN 8 - 23 mg/dL 18  12  15    Creatinine 0.61 - 1.24 mg/dL 8.45  8.59  8.43   Sodium 135 - 145 mmol/L 139  139  140   Potassium 3.5 - 5.1 mmol/L 4.4  4.3  4.6   Chloride 98 - 111 mmol/L 102  102  99   CO2 22 - 32 mmol/L 27  22  23    Calcium 8.9 - 10.3 mg/dL 89.9  89.9  9.9   Total Protein 6.5 - 8.1 g/dL 7.5     Total Bilirubin 0.0 - 1.2 mg/dL 0.4     Alkaline Phos 38 - 126 U/L 108     AST  15 - 41 U/L 23     ALT 0 - 44 U/L 17        Lab Results  Component Value Date   FERRITIN 73 11/04/2023   VITAMINB12 416 08/13/2023    Vitals:   11/19/23 1120  BP: 106/69  Pulse: 75  Resp: 16  Temp: 97.7 F (36.5 C)  SpO2: 97%    Review of System:  Review of Systems  Constitutional:  Positive for malaise/fatigue.  Cardiovascular:  Positive for leg swelling.  Neurological:  Negative for dizziness.  Psychiatric/Behavioral:  Negative for depression, hallucinations, memory loss, substance abuse and suicidal ideas. The patient is not nervous/anxious and does not have insomnia.     Physical Exam: Physical Exam Constitutional:      Appearance: Normal appearance.  HENT:     Head: Normocephalic and atraumatic.  Eyes:     Pupils: Pupils are equal, round, and reactive to light.  Cardiovascular:     Rate and Rhythm: Normal rate and regular rhythm.     Heart sounds: Normal heart sounds. No murmur heard. Pulmonary:     Effort: Pulmonary effort is normal.     Breath sounds: Normal breath sounds. No wheezing.  Abdominal:     General: Bowel sounds are normal. There is no distension.     Palpations: Abdomen is soft.     Tenderness: There is no abdominal tenderness.  Musculoskeletal:        General: Normal range of motion.     Cervical back: Normal range of motion.  Skin:    General: Skin is warm and dry.     Findings: No rash.  Neurological:     Mental Status: He is alert and oriented to person, place, and time.     Gait: Gait is intact.  Psychiatric:        Mood and Affect: Mood and affect normal.        Cognition and Memory: Memory normal.        Judgment: Judgment normal.      I spent 20 minutes dedicated to the care of this patient (face-to-face and non-face-to-face) on the date of the encounter to include what is described in the assessment and plan.,  Delon Hope, NP 11/19/2023 12:53 PM

## 2023-11-19 NOTE — Assessment & Plan Note (Addendum)
-   He does not report any recent respiratory infections. - Labs from 11/04/2023 show a ferritin of 73 (89), creatinine 1.54 (1.40) with normal LFTs.  CBC was unremarkable.  He is not taking iron supplements.  He takes a multivitamin daily.   - CT chest (11/04/2023): Redemonstrated postradiation changes in the bilateral upper hemothorax, anteriorly.  There are patchy ground glass changes in the bilateral upper lobes right more than left which appear slightly more pronounced than the prior exam however remain nonspecific and also favored sequela of evolving postradiation changes.  However correlate clinically for superimposed pneumonitis.  No new suspicious lung nodules. -We discussed the findings and he does not have any shortness of breath or cough.  We discussed in detail calling clinic should he develop shortness of breath or coughing as we can further workup above CT scan. - RTC 6 months for follow-up with repeat CT chest without contrast.

## 2023-11-25 ENCOUNTER — Ambulatory Visit (HOSPITAL_COMMUNITY)
Admission: RE | Admit: 2023-11-25 | Discharge: 2023-11-25 | Disposition: A | Discharge status: Home / Self Care | Source: Ambulatory Visit | Attending: Nephrology | Admitting: Nephrology

## 2023-11-25 DIAGNOSIS — R351 Nocturia: Secondary | ICD-10-CM | POA: Insufficient documentation

## 2023-11-25 DIAGNOSIS — N1330 Unspecified hydronephrosis: Secondary | ICD-10-CM | POA: Diagnosis not present

## 2023-11-25 DIAGNOSIS — N1832 Chronic kidney disease, stage 3b: Secondary | ICD-10-CM | POA: Diagnosis not present

## 2023-11-25 DIAGNOSIS — N183 Chronic kidney disease, stage 3 unspecified: Secondary | ICD-10-CM | POA: Diagnosis not present

## 2023-12-02 ENCOUNTER — Ambulatory Visit: Admitting: Family Medicine

## 2023-12-02 ENCOUNTER — Encounter: Payer: Self-pay | Admitting: Family Medicine

## 2023-12-02 VITALS — BP 97/64 | HR 71 | Temp 97.2°F | Ht 70.0 in | Wt 148.0 lb

## 2023-12-02 DIAGNOSIS — N1831 Chronic kidney disease, stage 3a: Secondary | ICD-10-CM | POA: Diagnosis not present

## 2023-12-02 DIAGNOSIS — E519 Thiamine deficiency, unspecified: Secondary | ICD-10-CM

## 2023-12-02 DIAGNOSIS — F319 Bipolar disorder, unspecified: Secondary | ICD-10-CM | POA: Diagnosis not present

## 2023-12-02 DIAGNOSIS — E782 Mixed hyperlipidemia: Secondary | ICD-10-CM

## 2023-12-02 DIAGNOSIS — E039 Hypothyroidism, unspecified: Secondary | ICD-10-CM | POA: Diagnosis not present

## 2023-12-02 DIAGNOSIS — R351 Nocturia: Secondary | ICD-10-CM

## 2023-12-02 DIAGNOSIS — I700 Atherosclerosis of aorta: Secondary | ICD-10-CM

## 2023-12-02 DIAGNOSIS — R600 Localized edema: Secondary | ICD-10-CM | POA: Diagnosis not present

## 2023-12-02 DIAGNOSIS — C37 Malignant neoplasm of thymus: Secondary | ICD-10-CM

## 2023-12-02 DIAGNOSIS — Z91199 Patient's noncompliance with other medical treatment and regimen due to unspecified reason: Secondary | ICD-10-CM | POA: Diagnosis not present

## 2023-12-02 DIAGNOSIS — E538 Deficiency of other specified B group vitamins: Secondary | ICD-10-CM

## 2023-12-02 DIAGNOSIS — E559 Vitamin D deficiency, unspecified: Secondary | ICD-10-CM | POA: Diagnosis not present

## 2023-12-02 DIAGNOSIS — F17210 Nicotine dependence, cigarettes, uncomplicated: Secondary | ICD-10-CM | POA: Diagnosis not present

## 2023-12-02 DIAGNOSIS — Z9089 Acquired absence of other organs: Secondary | ICD-10-CM | POA: Diagnosis not present

## 2023-12-02 DIAGNOSIS — N179 Acute kidney failure, unspecified: Secondary | ICD-10-CM | POA: Diagnosis not present

## 2023-12-02 MED ORDER — FUROSEMIDE 20 MG PO TABS: 20.0000 mg | ORAL_TABLET | Freq: Every day | ORAL | 3 refills | Status: AC | PRN

## 2023-12-02 MED ORDER — LEVOTHYROXINE SODIUM 50 MCG PO TABS: 50.0000 ug | ORAL_TABLET | Freq: Every day | ORAL | 1 refills | Status: AC

## 2023-12-02 NOTE — Progress Notes (Signed)
 Subjective:  Patient ID: Antonio Scott, male    DOB: [birth year], 68 y.o.   MRN: [id number]  Patient Care Team: Severa Rock HERO, FNP as PCP - General (Family Medicine) Cindie Carlin POUR, DO as Consulting Physician (Gastroenterology) Celestia Joesph SQUIBB, RN as Oncology Nurse Navigator (Medical Oncology)   Chief Complaint:  Medical Management of Chronic Issues (6 month follow up )   HPI: Antonio Scott is a 68 y.o. male presenting on 12/02/2023 for Medical Management of Chronic Issues (6 month follow up )   Antonio Scott is a 68 year old male who presents for a chronic follow-up visit.  Post-thymectomy status - History of thymoma, status post thymectomy - Under care of oncologist - Most recent chest scan on November 04, 2023 - Oncologist has given a clean bill of health - Scheduled for oncology follow-up every six months, next visit in April  Aortic atherosclerosis - Diagnosis of aortic atherosclerosis - Currently taking aspirin  for management - No history of cholesterol-lowering medication use  Medication nonadherence - Has not taken prescribed thiamine , furosemide , or thyroid  medication for approximately three weeks to one month due to running out of medications - Taking multivitamins instead of prescribed medications  Peripheral edema - History of lower extremity swelling, previously managed with furosemide  and compression socks - Attributes prior swelling to other medications - No current swelling reported  Neuropsychiatric symptoms - History of bipolar disorder - Not currently under psychiatric care - Returned all psychiatric medications  Constitutional and cardiopulmonary symptoms - No chest pain, shortness of breath, or headaches  Preventive care - Received influenza and COVID vaccinations at a pharmacy          Relevant past medical, surgical, family, and social history reviewed and updated as indicated.  Allergies and medications reviewed and updated.  Data reviewed: Chart in Epic.   Past Medical History:  Diagnosis Date   Anxiety    Cancer [facility])    History of external beam radiation therapy    Mediastinum- 08/01/21-09/11/21-Dr. Lynwood Nasuti   Hypothyroidism    Thymoma     Past Surgical History:  Procedure Laterality Date   INTERCOSTAL NERVE BLOCK Right 06/17/2021   Procedure: INTERCOSTAL NERVE BLOCK;  Surgeon: Shyrl Linnie KIDD, MD;  Location: [facility] OR;  Service: Thoracic;  Laterality: Right;   SKIN LESION EXCISION      Social History   Socioeconomic History   Marital status: Single    Spouse name: Not on file   Number of children: 0   Years of education: Not on file   Highest education level: Not on file  Occupational History   Occupation: retired  Tobacco Use   Smoking status: Every Day    Current packs/day: 1.00    Average packs/day: 0.6 packs/day for 105.8 years (65.8 ttl pk-yrs)    Types: Cigarettes    Start date: 1970   Smokeless tobacco: Never  Vaping Use   Vaping status: Never Used  Substance and Sexual Activity   Alcohol use: Not Currently   Drug use: Not Currently   Sexual activity: Not Currently  Other Topics Concern   Not on file  Social History Narrative   No children   Brother lives a mile away   Social Drivers of Health   Financial Resource Strain: Low Risk  (05/19/2023)   Overall Financial Resource Strain (CARDIA)    Difficulty of Paying Living Expenses: Not hard at all  Food Insecurity: No Food Insecurity (05/19/2023)   Hunger  Vital Sign    Worried About Programme Researcher, Broadcasting/film/video in the Last Year: Never true    Ran Out of Food in the Last Year: Never true  Transportation Needs: No Transportation Needs (05/19/2023)   PRAPARE - Administrator, Civil Service (Medical): No    Lack of Transportation (Non-Medical): No  Physical Activity: Insufficiently Active (05/19/2023)   Exercise Vital Sign    Days of Exercise per Week: 3 days    Minutes of Exercise per Session: 30 min  Stress: No  Stress Concern Present (05/19/2023)   [employer] of Occupational Health - Occupational Stress Questionnaire    Feeling of Stress : Not at all  Social Connections: Socially Isolated (05/19/2023)   Social Connection and Isolation Panel    Frequency of Communication with Friends and Family: Once a week    Frequency of Social Gatherings with Friends and Family: Once a week    Attends Religious Services: Never    Database Administrator or Organizations: No    Attends Banker Meetings: Never    Marital Status: Never married  Intimate Partner Violence: Not At Risk (05/19/2023)   Humiliation, Afraid, Rape, and Kick questionnaire    Fear of Current or Ex-Partner: No    Emotionally Abused: No    Physically Abused: No    Sexually Abused: No    Outpatient Encounter Medications as of 12/02/2023  Medication Sig   aspirin  EC 81 MG tablet Take 1 tablet (81 mg total) by mouth daily. Swallow whole.   Multiple Vitamins-Minerals (CENTRUM SILVER 50+MEN PO) Take 1 Dose by mouth daily.   thiamine  (VITAMIN B1) 100 MG tablet Take 100 mg by mouth daily.   [DISCONTINUED] furosemide  (LASIX ) 20 MG tablet TAKE 1 TABLET BY MOUTH EVERY DAY   furosemide  (LASIX ) 20 MG tablet Take 1 tablet (20 mg total) by mouth daily as needed for edema or fluid.   levothyroxine  (SYNTHROID ) 50 MCG tablet Take 1 tablet (50 mcg total) by mouth daily.   [DISCONTINUED] levothyroxine  (SYNTHROID ) 50 MCG tablet Take 1 tablet (50 mcg total) by mouth daily.   No facility-administered encounter medications on file as of 12/02/2023.    No Known Allergies  Pertinent ROS per HPI, otherwise unremarkable      Objective:  BP 97/64   Pulse 71   Temp (!) 97.2 F (36.2 C)   Ht 5' 10 (1.778 m)   Wt 148 lb (67.1 kg)   BMI 21.24 kg/m    Wt Readings from Last 3 Encounters:  12/02/23 148 lb (67.1 kg)  11/19/23 149 lb 0.5 oz (67.6 kg)  08/27/23 144 lb 6.4 oz (65.5 kg)    Physical Exam Vitals and nursing note reviewed.   Constitutional:      General: He is not in acute distress.    Appearance: Normal appearance. He is normal weight. He is not ill-appearing, toxic-appearing or diaphoretic.  HENT:     Head: Normocephalic and atraumatic.     Nose: Nose normal.     Mouth/Throat:     Mouth: Mucous membranes are moist.  Eyes:     Conjunctiva/sclera: Conjunctivae normal.     Pupils: Pupils are equal, round, and reactive to light.  Cardiovascular:     Rate and Rhythm: Normal rate and regular rhythm.     Heart sounds: Normal heart sounds.  Pulmonary:     Effort: Pulmonary effort is normal.     Breath sounds: Normal breath sounds.  Musculoskeletal:  Cervical back: Neck supple.     Right lower leg: No edema.     Left lower leg: No edema.  Skin:    General: Skin is warm and dry.     Capillary Refill: Capillary refill takes less than 2 seconds.  Neurological:     General: No focal deficit present.     Mental Status: He is alert and oriented to person, place, and time.  Psychiatric:        Mood and Affect: Mood normal.        Behavior: Behavior normal.        Thought Content: Thought content normal.        Judgment: Judgment normal.      Results for orders placed or performed in visit on 11/04/23  CBC with Differential   Collection Time: 11/04/23  3:15 PM  Result Value Ref Range   WBC 5.8 4.0 - 10.5 K/uL   RBC 4.49 4.22 - 5.81 MIL/uL   Hemoglobin 14.4 13.0 - 17.0 g/dL   HCT 56.5 60.9 - 47.9 %   MCV 96.7 80.0 - 100.0 fL   MCH 32.1 26.0 - 34.0 pg   MCHC 33.2 30.0 - 36.0 g/dL   RDW 87.3 88.4 - 84.4 %   Platelets 223 150 - 400 K/uL   nRBC 0.0 0.0 - 0.2 %   Neutrophils Relative % 77 %   Neutro Abs 4.5 1.7 - 7.7 K/uL   Lymphocytes Relative 10 %   Lymphs Abs 0.6 (L) 0.7 - 4.0 K/uL   Monocytes Relative 8 %   Monocytes Absolute 0.5 0.1 - 1.0 K/uL   Eosinophils Relative 4 %   Eosinophils Absolute 0.2 0.0 - 0.5 K/uL   Basophils Relative 1 %   Basophils Absolute 0.1 0.0 - 0.1 K/uL   Immature  Granulocytes 0 %   Abs Immature Granulocytes 0.01 0.00 - 0.07 K/uL  Comprehensive metabolic panel   Collection Time: 11/04/23  3:15 PM  Result Value Ref Range   Sodium 139 135 - 145 mmol/L   Potassium 4.4 3.5 - 5.1 mmol/L   Chloride 102 98 - 111 mmol/L   CO2 27 22 - 32 mmol/L   Glucose, Bld 93 70 - 99 mg/dL   BUN 18 8 - 23 mg/dL   Creatinine, Ser 8.45 (H) 0.61 - 1.24 mg/dL   Calcium 89.9 8.9 - 89.6 mg/dL   Total Protein 7.5 6.5 - 8.1 g/dL   Albumin 4.5 3.5 - 5.0 g/dL   AST 23 15 - 41 U/L   ALT 17 0 - 44 U/L   Alkaline Phosphatase 108 38 - 126 U/L   Total Bilirubin 0.4 0.0 - 1.2 mg/dL   GFR, Estimated 49 (L) >60 mL/min   Anion gap 11 5 - 15  Iron and TIBC ([facility] ONLY)   Collection Time: 11/04/23  3:15 PM  Result Value Ref Range   Iron 80 45 - 182 ug/dL   TIBC 628 749 - 549 ug/dL   Saturation Ratios 22 17.9 - 39.5 %   UIBC 291 ug/dL  Ferritin   Collection Time: 11/04/23  3:15 PM  Result Value Ref Range   Ferritin 73 24 - 336 ng/mL       Pertinent labs & imaging results that were available during my care of the patient were reviewed by me and considered in my medical decision making.  Assessment & Plan:  Keeon was seen today for medical management of chronic issues.  Diagnoses and all orders  for this visit:  Malignant neoplasm of thymus (HCC)  S/P thymectomy -     Anemia Profile B -     TSH -     T4, Free -     Vitamin B1  Thiamin deficiency -     Vitamin B1  Acquired hypothyroidism -     levothyroxine  (SYNTHROID ) 50 MCG tablet; Take 1 tablet (50 mcg total) by mouth daily. -     TSH -     T4, Free  Mixed hyperlipidemia -     CMP14+EGFR -     Lipid panel  Aortic atherosclerosis -     CMP14+EGFR -     Lipid panel  Vitamin B 12 deficiency -     Anemia Profile B  Nocturia more than twice per night -     CMP14+EGFR  Heavy cigarette smoker -     Anemia Profile B -     CMP14+EGFR  Bipolar 1 disorder (HCC) -     Anemia Profile  B -     CMP14+EGFR -     Lipid panel -     TSH -     VITAMIN D  25 Hydroxy (Vit-D Deficiency, Fractures) -     T4, Free -     Vitamin B1  Noncompliance -     Anemia Profile B -     CMP14+EGFR -     Lipid panel -     TSH -     VITAMIN D  25 Hydroxy (Vit-D Deficiency, Fractures) -     T4, Free -     Vitamin B1  Bilateral leg edema -     furosemide  (LASIX ) 20 MG tablet; Take 1 tablet (20 mg total) by mouth daily as needed for edema or fluid. -     Anemia Profile B -     CMP14+EGFR -     Lipid panel -     TSH -     VITAMIN D  25 Hydroxy (Vit-D Deficiency, Fractures) -     T4, Free -     Vitamin B1     Atherosclerosis of aorta Aortic atherosclerosis with plaque buildup in the arteries. Currently taking aspirin  for management. No recent chest pain, shortness of breath, or other symptoms reported. - Continue aspirin  therapy - Checked cholesterol levels - Will initiate cholesterol medication if levels are elevated  Hypothyroidism Nonadherence to thyroid  medication for approximately three weeks to a month. No current symptoms reported.  Thiamine  deficiency Nonadherence to thiamine  supplementation for about a month. Currently taking multivitamins instead. - Checked thiamine  levels  Medication nonadherence Nonadherence to multiple medications including thiamine , furosemide , and thyroid  medication. Reports running out of medications due to scheduling issues. - Discussed importance of medication adherence - Sent prescription for furosemide  to be taken as needed for edema  Edema (resolved) Previous leg swelling resolved. No current swelling observed. Previously managed with furosemide  and compression socks. - Use furosemide  as needed if swelling recurs  General Health Maintenance Received flu shot and COVID booster at [facility]. - Updated immunization records          Continue all other maintenance medications.  Follow up plan: Return in about 6 months (around 06/01/2024) for  chronic follow up.   Continue healthy lifestyle choices, including diet (rich in fruits, vegetables, and lean proteins, and low in salt and simple carbohydrates) and exercise (at least 30 minutes of moderate physical activity daily).  Educational handout given for health maintenance   The above assessment and management  plan was discussed with the patient. The patient verbalized understanding of and has agreed to the management plan. Patient is aware to call the clinic if they develop any new symptoms or if symptoms persist or worsen. Patient is aware when to return to the clinic for a follow-up visit. Patient educated on when it is appropriate to go to the emergency department.   Rosaline Bruns, FNP-C [facility] [phone]

## 2023-12-03 ENCOUNTER — Ambulatory Visit: Payer: Self-pay | Admitting: Family Medicine

## 2023-12-07 LAB — ANEMIA PROFILE B
Basophils Absolute: 0.1 x10E3/uL (ref 0.0–0.2)
Basos: 1 %
EOS (ABSOLUTE): 0.2 x10E3/uL (ref 0.0–0.4)
Eos: 3 %
Ferritin: 65 ng/mL (ref 30–400)
Folate: 17.3 ng/mL (ref >3.0)
Hematocrit: 46.1 % (ref 37.5–51.0)
Hemoglobin: 15.2 g/dL (ref 13.0–17.7)
Immature Grans (Abs): 0 x10E3/uL (ref 0.0–0.1)
Immature Granulocytes: 0 %
Iron Saturation: 27 % (ref 15–55)
Iron: 90 ug/dL (ref 38–169)
Lymphocytes Absolute: 0.9 x10E3/uL (ref 0.7–3.1)
Lymphs: 16 %
MCH: 31.9 pg (ref 26.6–33.0)
MCHC: 33 g/dL (ref 31.5–35.7)
MCV: 97 fL (ref 79–97)
Monocytes Absolute: 0.5 x10E3/uL (ref 0.1–0.9)
Monocytes: 9 %
Neutrophils Absolute: 4 x10E3/uL (ref 1.4–7.0)
Neutrophils: 71 %
Platelets: 209 x10E3/uL (ref 150–450)
RBC: 4.77 x10E6/uL (ref 4.14–5.80)
RDW: 12.2 % (ref 11.6–15.4)
Retic Ct Pct: 0.6 % (ref 0.6–2.6)
Total Iron Binding Capacity: 337 ug/dL (ref 250–450)
UIBC: 247 ug/dL (ref 111–343)
Vitamin B-12: 472 pg/mL (ref 232–1245)
WBC: 5.7 x10E3/uL (ref 3.4–10.8)

## 2023-12-07 LAB — CMP14+EGFR
ALT: 19 IU/L (ref 0–44)
AST: 19 IU/L (ref 0–40)
Albumin: 4.6 g/dL (ref 3.9–4.9)
Alkaline Phosphatase: 106 IU/L (ref 47–123)
BUN/Creatinine Ratio: 9 — ABNORMAL LOW (ref 10–24)
BUN: 13 mg/dL (ref 8–27)
Bilirubin Total: 0.3 mg/dL (ref 0.0–1.2)
CO2: 23 mmol/L (ref 20–29)
Calcium: 10 mg/dL (ref 8.6–10.2)
Chloride: 101 mmol/L (ref 96–106)
Creatinine, Ser: 1.45 mg/dL — ABNORMAL HIGH (ref 0.76–1.27)
Globulin, Total: 2.7 g/dL (ref 1.5–4.5)
Glucose: 90 mg/dL (ref 70–99)
Potassium: 5.1 mmol/L (ref 3.5–5.2)
Sodium: 139 mmol/L (ref 134–144)
Total Protein: 7.3 g/dL (ref 6.0–8.5)
eGFR: 52 mL/min/1.73 — ABNORMAL LOW (ref >59)

## 2023-12-07 LAB — LIPID PANEL
Chol/HDL Ratio: 2.1 ratio (ref 0.0–5.0)
Cholesterol, Total: 161 mg/dL (ref 100–199)
HDL: 77 mg/dL (ref >39)
LDL Chol Calc (NIH): 65 mg/dL (ref 0–99)
Triglycerides: 107 mg/dL (ref 0–149)
VLDL Cholesterol Cal: 19 mg/dL (ref 5–40)

## 2023-12-07 LAB — VITAMIN D 25 HYDROXY (VIT D DEFICIENCY, FRACTURES): Vit D, 25-Hydroxy: 29.3 ng/mL — ABNORMAL LOW (ref 30.0–100.0)

## 2023-12-07 LAB — TSH: TSH: 8.21 u[IU]/mL — ABNORMAL HIGH (ref 0.450–4.500)

## 2023-12-07 LAB — T4, FREE: Free T4: 0.89 ng/dL (ref 0.82–1.77)

## 2023-12-07 LAB — VITAMIN B1: Thiamine: 171 nmol/L (ref 66.5–200.0)

## 2024-02-05 IMAGING — CR DG CHEST 2V
2 series · 2 of 2 positions shown · non-contrast
Comparison: Chest radiograph April 19, 2021.

CLINICAL DATA: mediastinal mass

EXAM:
CHEST - 2 VIEW

[w chest pa]
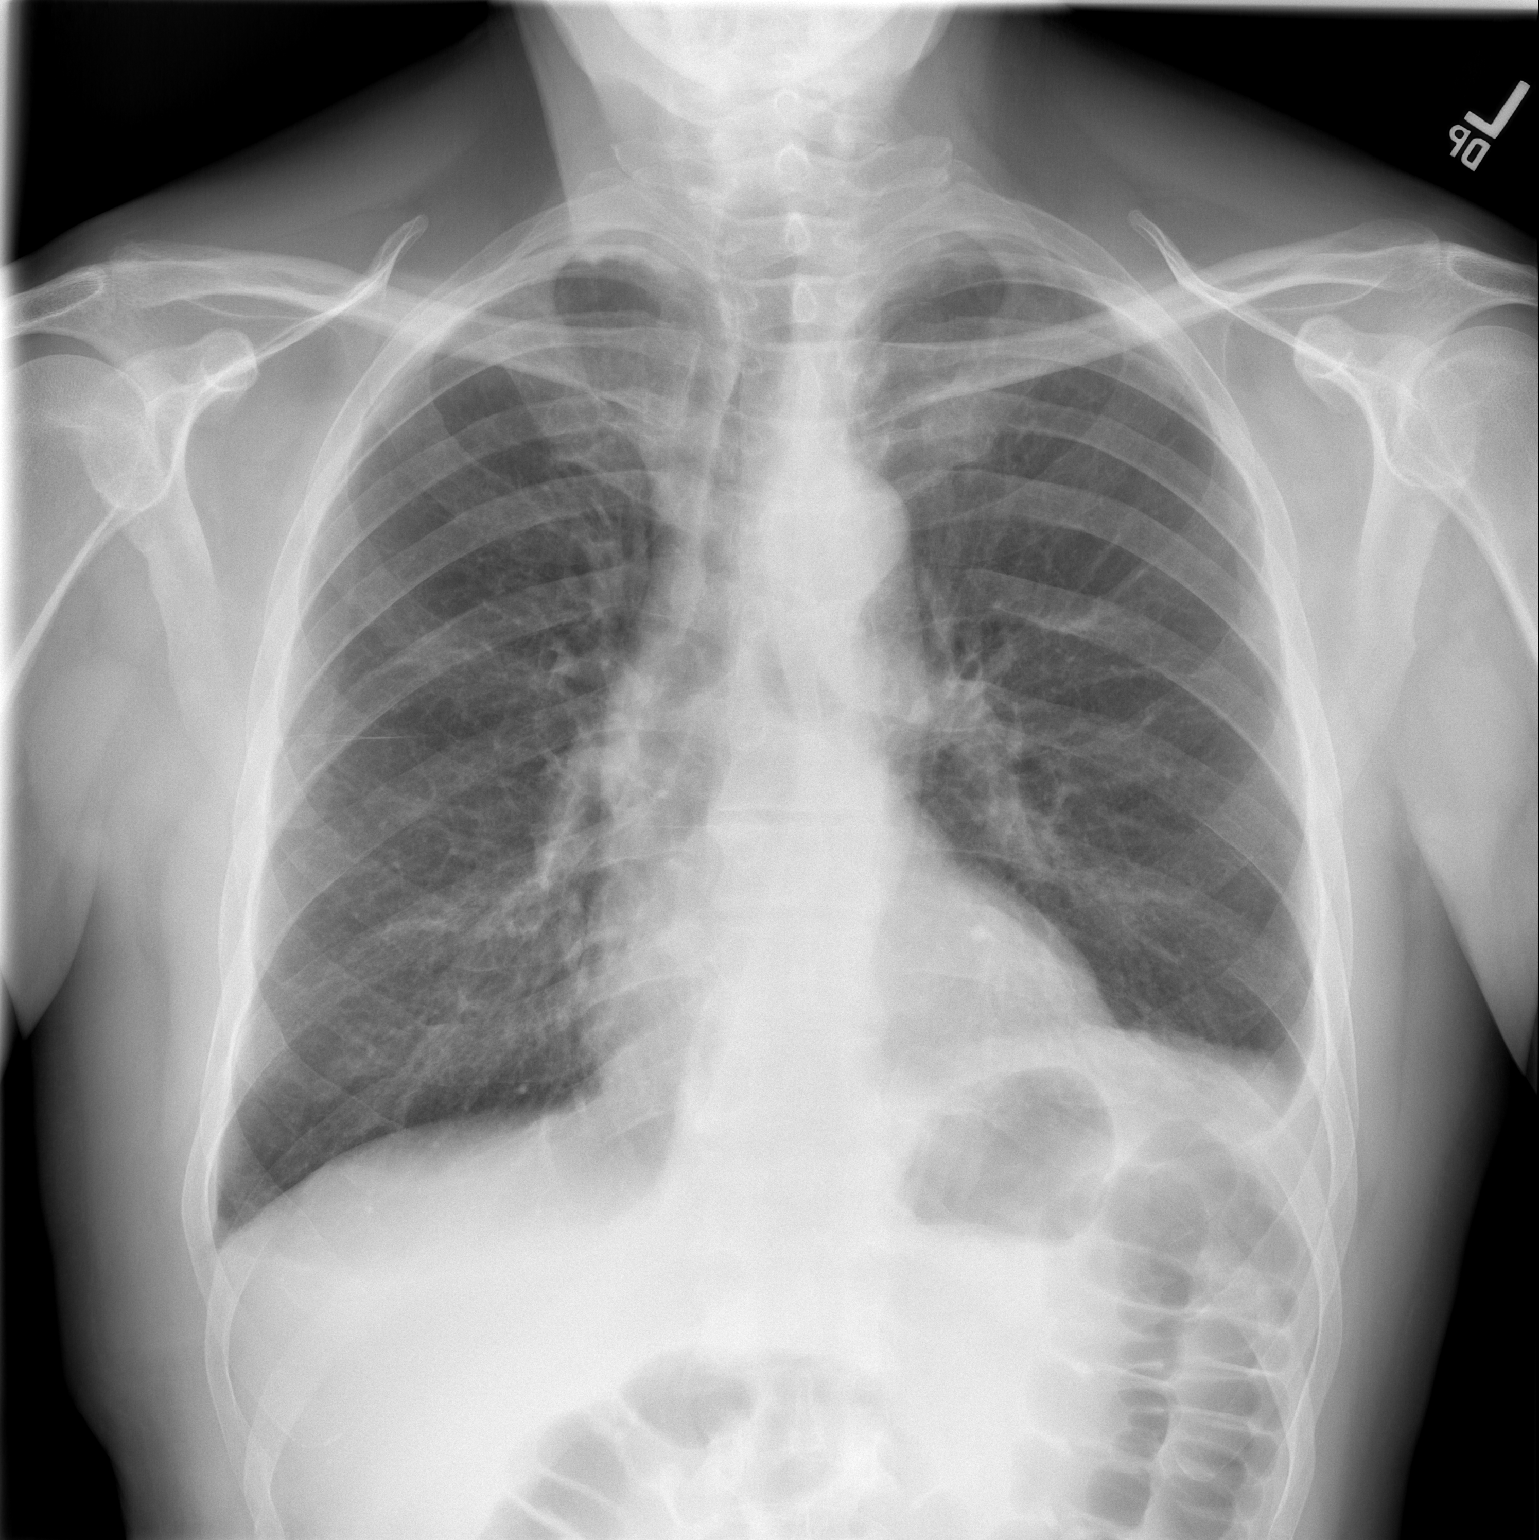

[w chest lat]
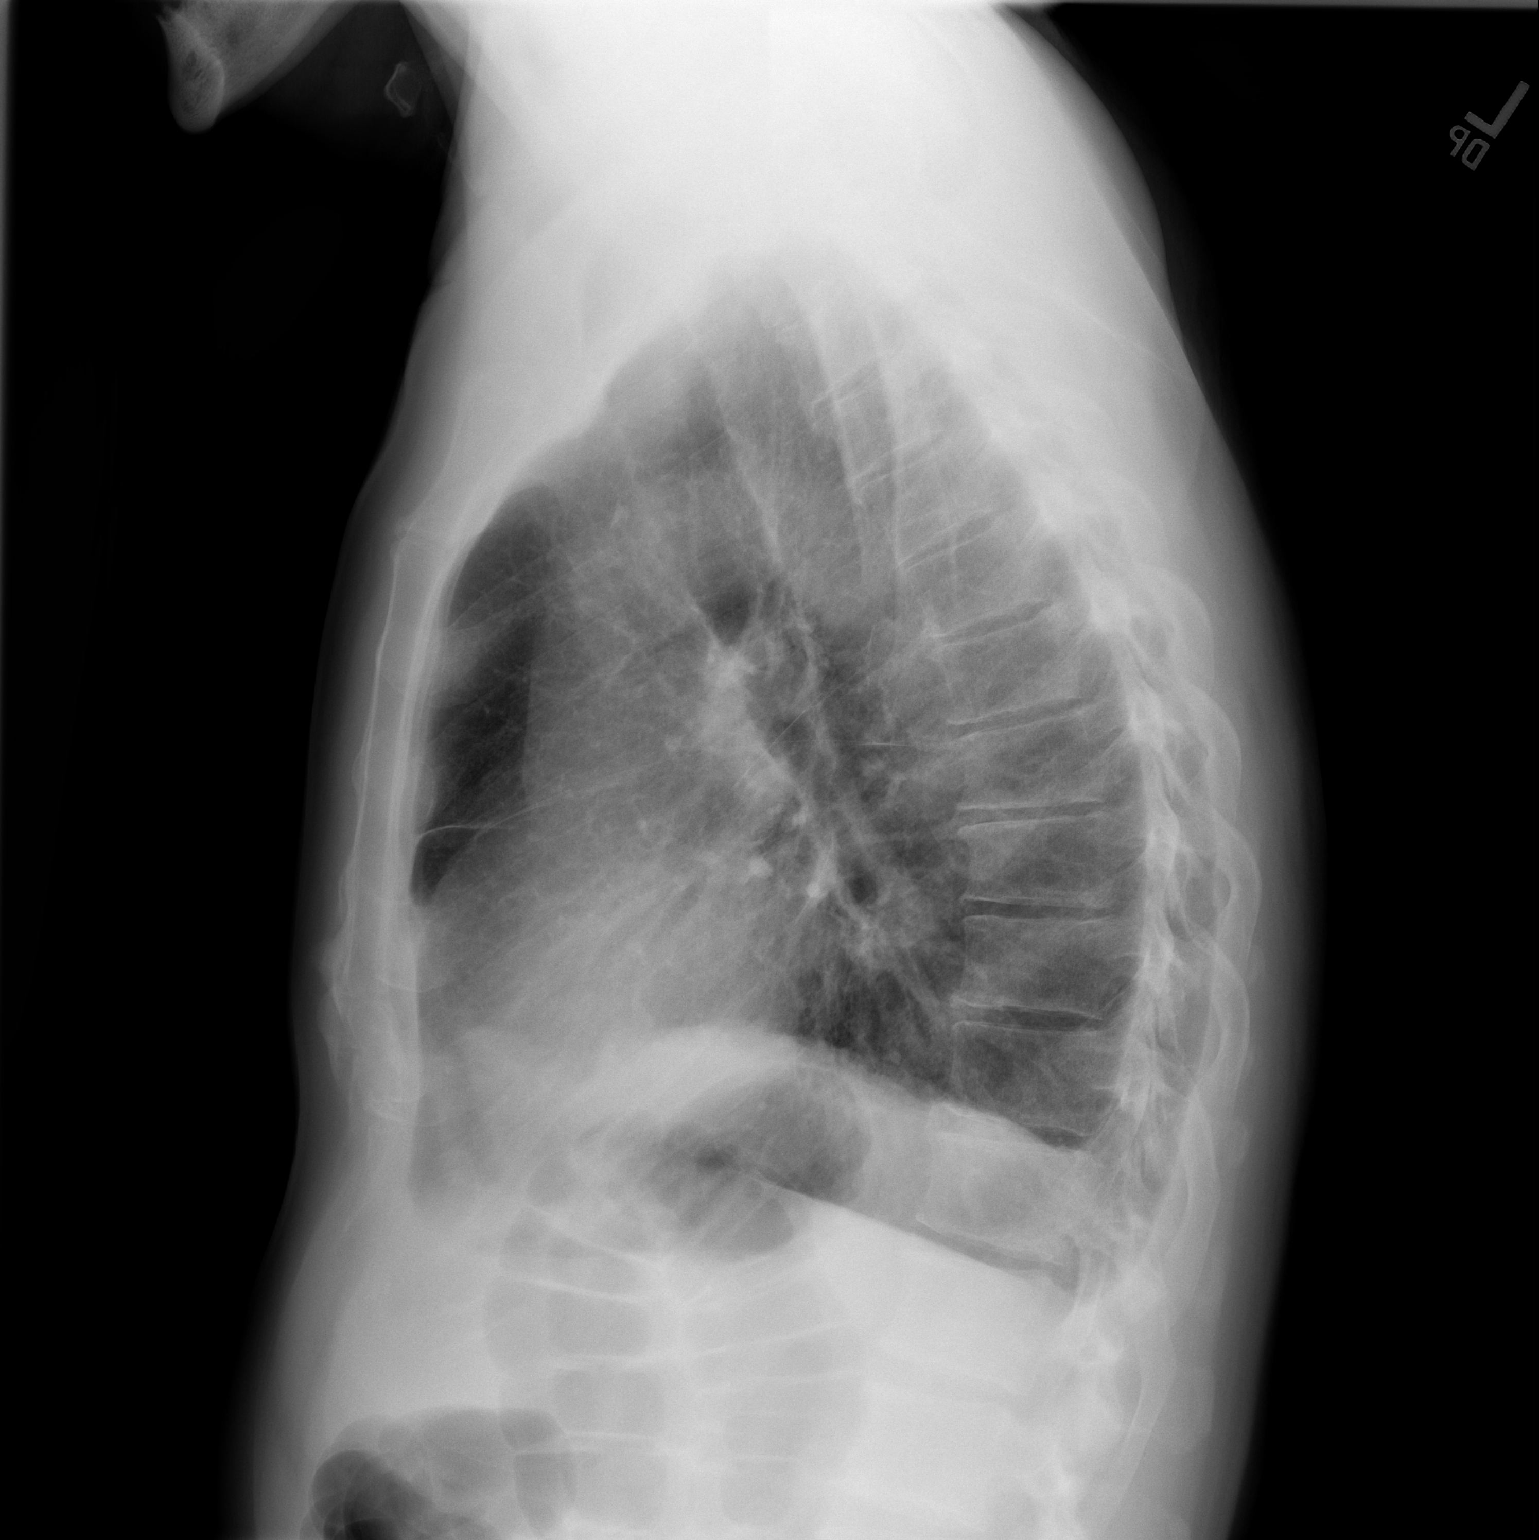

[2 of 2 positions shown; findings below may reference images not displayed]

FINDINGS: Known mediastinal mass not well characterized by radiograph.
Cardiomediastinal silhouette appears similar to prior. Improved
aeration the left lung base with mild residual left basilar
opacities. Possible trace bilateral pleural effusions. No visible
pneumothorax. Biapical pleuroparenchymal scarring.
IMPRESSION: 1. Improved aeration of the left lung base with mild residual left
basilar opacities.
2. Possible trace bilateral pleural effusions.
3. Known mediastinal mass not well characterized by radiograph.
Chest CT could better evaluate if clinically warranted.

## 2024-02-08 NOTE — Progress Notes (Signed)
 "   CC: Blocked right kidney   HPI: 69 year old male sent for evaluation and management of mild right hydronephrosis seen on ultrasound of the kidneys performed in October, 2025.  He has never had any urologic issues to this point.  No lower urinary tract symptoms that are bothersome.  IPSS 11.  He is a smoker.  No gross hematuria.  Did have excision of a thymoma in 1923 by Dr. Shyrl.  PMH: Past Medical History:  Diagnosis Date   Anxiety    Cancer [facility])    History of external beam radiation therapy    Mediastinum- 08/01/21-09/11/21-Dr. Lynwood Nasuti   Hypothyroidism    Thymoma     Surgical History: Past Surgical History:  Procedure Laterality Date   INTERCOSTAL NERVE BLOCK Right 06/17/2021   Procedure: INTERCOSTAL NERVE BLOCK;  Surgeon: Shyrl Linnie KIDD, MD;  Location: [facility] OR;  Service: Thoracic;  Laterality: Right;   SKIN LESION EXCISION      Home Medications:  Allergies as of 02/09/2024   No Known Allergies      Medication List        Accurate as of February 08, 2024  2:42 PM. If you have any questions, ask your nurse or doctor.          aspirin  EC 81 MG tablet Take 1 tablet (81 mg total) by mouth daily. Swallow whole.   CENTRUM SILVER 50+MEN PO Take 1 Dose by mouth daily.   furosemide  20 MG tablet Commonly known as: LASIX  Take 1 tablet (20 mg total) by mouth daily as needed for edema or fluid.   levothyroxine  50 MCG tablet Commonly known as: SYNTHROID  Take 1 tablet (50 mcg total) by mouth daily.   thiamine  100 MG tablet Commonly known as: VITAMIN B1 Take 100 mg by mouth daily.        Allergies: Allergies[1]  Family History: Family History  Problem Relation Age of Onset   Stroke Mother    Diabetes Mother    Cancer Father    Cancer Brother    Liver cancer Maternal Aunt    Colon cancer Paternal Grandfather     Social History:  reports that he has been smoking cigarettes. He started smoking about 56 years ago. He has a 66 pack-year  smoking history. He has never used smokeless tobacco. He reports that he does not currently use alcohol. He reports that he does not currently use drugs.  ROS: All other review of systems were reviewed and are negative except what is noted above in HPI  Physical Exam: There were no vitals taken for this visit.  Constitutional:  Alert and oriented, No acute distress. HEENT: [place] AT, moist mucus membranes.  Trachea midline, no masses. Cardiovascular: No clubbing, cyanosis, or edema. Respiratory: Normal respiratory effort, no increased work of breathing. Skin: No rashes, bruises or suspicious lesions. Neurologic: Grossly intact, no focal deficits, moving all 4 extremities. Psychiatric: Normal mood and affect.  Laboratory Data: Lab Results  Component Value Date   WBC 5.7 12/02/2023   HGB 15.2 12/02/2023   HCT 46.1 12/02/2023   MCV 97 12/02/2023   PLT 209 12/02/2023    Lab Results  Component Value Date   CREATININE 1.45 (H) 12/02/2023    No results found for: PSA  No results found for: TESTOSTERONE  Lab Results  Component Value Date   HGBA1C 4.8 06/02/2023    Urinalysis  Renal ultrasound images from October of this year reviewed.  There is a prominent renal pelvis.  Prevoid  urine volume 272 mL, postvoid volume 86  I reviewed patient's PET CT scans from 2023.  He has bilateral extrarenal pelves.  No hydronephrosis.  Assessment:  Abnormal appearance of right kidney on ultrasound.  The patient has an extrarenal pelvis.  Comparing this to CT cuts from his PET/CT in 2023, similar in nature.  I do not think there is any obstructive process going on  Plan:   Patient was reassured about the appearance of his kidneys-both kidneys have extrarenal pelves  No further evaluation needed  Return as needed  Garnette CHRISTELLA Shack, MD  [facility] [place]      [1] No Known Allergies  "

## 2024-02-09 ENCOUNTER — Ambulatory Visit: Admitting: Urology

## 2024-02-09 VITALS — BP 106/66 | HR 82

## 2024-02-09 DIAGNOSIS — N1330 Unspecified hydronephrosis: Secondary | ICD-10-CM | POA: Diagnosis not present

## 2024-03-10 NOTE — Progress Notes (Signed)
 Antonio Scott                                          MRN: [id number]   03/10/2024   The [facility] Quality Team Specialist reviewed this patient medical record for the purposes of chart review for care gap closure. The following were reviewed: chart review for care gap closure-colorectal cancer screening.    [facility] Quality Team

## 2024-05-19 ENCOUNTER — Other Ambulatory Visit (HOSPITAL_COMMUNITY)

## 2024-05-19 ENCOUNTER — Ambulatory Visit: Payer: Self-pay

## 2024-05-19 ENCOUNTER — Inpatient Hospital Stay

## 2024-05-23 ENCOUNTER — Ambulatory Visit

## 2024-05-26 ENCOUNTER — Inpatient Hospital Stay: Admitting: Oncology

## 2024-05-27 ENCOUNTER — Inpatient Hospital Stay: Admitting: Oncology

## 2024-05-31 ENCOUNTER — Ambulatory Visit: Admitting: Family Medicine
